# Patient Record
Sex: Female | Born: 1985 | Race: White | Hispanic: No | Marital: Single | Smoking: Former smoker
Health system: Southern US, Community
[De-identification: ages and names within clinical notes are randomized; demographics above are authoritative.]

## PROBLEM LIST (undated history)

## (undated) DIAGNOSIS — F4310 Post-traumatic stress disorder, unspecified: Secondary | ICD-10-CM

## (undated) DIAGNOSIS — R000 Tachycardia, unspecified: Secondary | ICD-10-CM

## (undated) DIAGNOSIS — F32A Depression, unspecified: Secondary | ICD-10-CM

## (undated) DIAGNOSIS — I82409 Acute embolism and thrombosis of unspecified deep veins of unspecified lower extremity: Secondary | ICD-10-CM

## (undated) DIAGNOSIS — J45909 Unspecified asthma, uncomplicated: Secondary | ICD-10-CM

## (undated) DIAGNOSIS — F329 Major depressive disorder, single episode, unspecified: Secondary | ICD-10-CM

## (undated) DIAGNOSIS — I499 Cardiac arrhythmia, unspecified: Secondary | ICD-10-CM

## (undated) DIAGNOSIS — F419 Anxiety disorder, unspecified: Secondary | ICD-10-CM

## (undated) DIAGNOSIS — N200 Calculus of kidney: Secondary | ICD-10-CM

## (undated) HISTORY — DX: Post-traumatic stress disorder, unspecified: F43.10

## (undated) HISTORY — DX: Depression, unspecified: F32.A

## (undated) HISTORY — DX: Tachycardia, unspecified: R00.0

## (undated) HISTORY — DX: Acute embolism and thrombosis of unspecified deep veins of unspecified lower extremity: I82.409

## (undated) HISTORY — DX: Anxiety disorder, unspecified: F41.9

## (undated) HISTORY — DX: Major depressive disorder, single episode, unspecified: F32.9

## (undated) HISTORY — DX: Unspecified asthma, uncomplicated: J45.909

---

## 2005-10-23 DIAGNOSIS — N200 Calculus of kidney: Secondary | ICD-10-CM

## 2005-10-23 HISTORY — DX: Calculus of kidney: N20.0

## 2011-10-24 HISTORY — PX: TUBAL LIGATION: SHX77

## 2013-05-11 ENCOUNTER — Encounter (HOSPITAL_COMMUNITY): Payer: Self-pay | Admitting: *Deleted

## 2013-05-11 ENCOUNTER — Emergency Department (HOSPITAL_COMMUNITY)
Admission: EM | Admit: 2013-05-11 | Discharge: 2013-05-11 | Discharge status: Against Medical Advice | Payer: Self-pay | Attending: Emergency Medicine | Admitting: Emergency Medicine

## 2013-05-11 DIAGNOSIS — F17200 Nicotine dependence, unspecified, uncomplicated: Secondary | ICD-10-CM | POA: Insufficient documentation

## 2013-05-11 DIAGNOSIS — F410 Panic disorder [episodic paroxysmal anxiety] without agoraphobia: Secondary | ICD-10-CM | POA: Insufficient documentation

## 2013-05-11 NOTE — ED Notes (Signed)
Pts name called no answer x2

## 2013-05-11 NOTE — ED Notes (Signed)
Did not respond to call from waiting room x 1

## 2013-05-11 NOTE — ED Notes (Signed)
The pt reports that she had  A panic attack earlier today and it was suggested to come to the ed from the [facility] house.  She has been free of cocaine for one week.  More frequent panic attacks  For one week

## 2013-06-22 ENCOUNTER — Emergency Department (HOSPITAL_COMMUNITY)
Admission: EM | Admit: 2013-06-22 | Discharge: 2013-06-22 | Disposition: A | Discharge status: Home / Self Care | Payer: Self-pay | Attending: Emergency Medicine | Admitting: Emergency Medicine

## 2013-06-22 ENCOUNTER — Encounter (HOSPITAL_COMMUNITY): Payer: Self-pay | Admitting: *Deleted

## 2013-06-22 DIAGNOSIS — Z9851 Tubal ligation status: Secondary | ICD-10-CM | POA: Insufficient documentation

## 2013-06-22 DIAGNOSIS — Z79899 Other long term (current) drug therapy: Secondary | ICD-10-CM | POA: Insufficient documentation

## 2013-06-22 DIAGNOSIS — Z87442 Personal history of urinary calculi: Secondary | ICD-10-CM | POA: Insufficient documentation

## 2013-06-22 DIAGNOSIS — R109 Unspecified abdominal pain: Secondary | ICD-10-CM | POA: Insufficient documentation

## 2013-06-22 DIAGNOSIS — M545 Low back pain, unspecified: Secondary | ICD-10-CM | POA: Insufficient documentation

## 2013-06-22 DIAGNOSIS — Z3202 Encounter for pregnancy test, result negative: Secondary | ICD-10-CM | POA: Insufficient documentation

## 2013-06-22 DIAGNOSIS — M549 Dorsalgia, unspecified: Secondary | ICD-10-CM

## 2013-06-22 DIAGNOSIS — E669 Obesity, unspecified: Secondary | ICD-10-CM | POA: Insufficient documentation

## 2013-06-22 DIAGNOSIS — Z87891 Personal history of nicotine dependence: Secondary | ICD-10-CM | POA: Insufficient documentation

## 2013-06-22 HISTORY — DX: Calculus of kidney: N20.0

## 2013-06-22 LAB — COMPREHENSIVE METABOLIC PANEL
AST: 11 U/L (ref 0–37)
Albumin: 3.2 g/dL — ABNORMAL LOW (ref 3.5–5.2)
BUN: 19 mg/dL (ref 6–23)
Calcium: 9.1 mg/dL (ref 8.4–10.5)
Creatinine, Ser: 0.78 mg/dL (ref 0.50–1.10)
Total Bilirubin: 0.2 mg/dL — ABNORMAL LOW (ref 0.3–1.2)
Total Protein: 6.4 g/dL (ref 6.0–8.3)

## 2013-06-22 LAB — CBC WITH DIFFERENTIAL/PLATELET
Basophils Absolute: 0.1 10*3/uL (ref 0.0–0.1)
Basophils Relative: 1 % (ref 0–1)
Eosinophils Absolute: 0.2 10*3/uL (ref 0.0–0.7)
Eosinophils Relative: 2 % (ref 0–5)
HCT: 37.1 % (ref 36.0–46.0)
Hemoglobin: 12.7 g/dL (ref 12.0–15.0)
MCH: 29.1 pg (ref 26.0–34.0)
MCHC: 34.2 g/dL (ref 30.0–36.0)
Monocytes Absolute: 0.8 10*3/uL (ref 0.1–1.0)
Monocytes Relative: 8 % (ref 3–12)
RDW: 13 % (ref 11.5–15.5)

## 2013-06-22 LAB — URINALYSIS, ROUTINE W REFLEX MICROSCOPIC
Glucose, UA: NEGATIVE mg/dL
Leukocytes, UA: NEGATIVE
Protein, ur: NEGATIVE mg/dL
Specific Gravity, Urine: 1.024 (ref 1.005–1.030)

## 2013-06-22 LAB — POCT PREGNANCY, URINE: Preg Test, Ur: NEGATIVE

## 2013-06-22 MED ORDER — KETOROLAC TROMETHAMINE 60 MG/2ML IM SOLN
30.0000 mg | Freq: Once | INTRAMUSCULAR | Status: AC
  Administered 2013-06-22: 30 mg via INTRAMUSCULAR
  Filled 2013-06-22: qty 2

## 2013-06-22 MED ORDER — ACETAMINOPHEN 325 MG PO TABS: 975.0000 mg | ORAL_TABLET | Freq: Once | ORAL | Status: DC

## 2013-06-22 MED ORDER — ACETAMINOPHEN 325 MG PO TABS: 650.0000 mg | ORAL_TABLET | Freq: Once | ORAL | Status: DC

## 2013-06-22 MED ORDER — ONDANSETRON 4 MG PO TBDP
4.0000 mg | ORAL_TABLET | Freq: Once | ORAL | Status: AC
  Administered 2013-06-22: 4 mg via ORAL
  Filled 2013-06-22: qty 1

## 2013-06-22 MED ORDER — TRAMADOL HCL 50 MG PO TABS: 50.0000 mg | ORAL_TABLET | Freq: Four times a day (QID) | ORAL | Status: DC | PRN

## 2013-06-22 NOTE — ED Notes (Signed)
C/o low back pain for 5 day, abd pain with nausea today

## 2013-06-22 NOTE — ED Provider Notes (Signed)
Medical screening examination/treatment/procedure(s) were performed by non-physician practitioner and as supervising physician I was immediately available for consultation/collaboration.  Flint Melter, MD 06/22/13 [phone]

## 2013-06-22 NOTE — ED Notes (Signed)
Pt wanted xray before discharge- EDP Wentz in to explain reason not .  Pt understood - delay for discharge for EDP to talk with pt

## 2013-06-22 NOTE — ED Provider Notes (Signed)
CSN: [id number]     Arrival date & time 06/22/13  2007 History   First MD Initiated Contact with Patient 06/22/13 2107     Chief Complaint  Patient presents with  . Back Pain  . Abdominal Pain   (Consider location/radiation/quality/duration/timing/severity/associated sxs/prior Treatment) HPI  Tracie Aguilar is a 27 y.o. female complaining of low back pain x5 days. Pain is described as severe, non-radiating. She has been taking motrin and APAP with little relief. Pt also reports a lower abd cramping now resolved stating today lasting 30 minutes described as sharp and severe. Pt denies fever, numbness weakness, change in bowel or bladder habits, h/o IVDU or cancer, abnormal vaginal discharge.   Past Medical History  Diagnosis Date  . Kidney stones    Past Surgical History  Procedure Laterality Date  . Tubal ligation     No family history on file. History  Substance Use Topics  . Smoking status: Former Games developer  . Smokeless tobacco: Not on file  . Alcohol Use: Yes   OB History   Grav Para Term Preterm Abortions TAB SAB Ect Mult Living                 Review of Systems 10 systems reviewed and found to be negative, except as noted in the HPI  Allergies  Sulfa antibiotics  Home Medications   Current Outpatient Rx  Name  Route  Sig  Dispense  Refill  . acetaminophen (TYLENOL) 500 MG tablet   Oral   Take 500 mg by mouth every 6 (six) hours as needed for pain.         Tracie Aguilar FLUoxetine (PROZAC) 20 MG capsule   Oral   Take 20 mg by mouth daily.         Tracie Aguilar ibuprofen (ADVIL,MOTRIN) 200 MG tablet   Oral   Take 200 mg by mouth every 6 (six) hours as needed for pain.         . Melatonin 3 MG TABS   Oral   Take 1 tablet by mouth at bedtime.          BP 114/56  Pulse 76  Temp(Src) 98.9 F (37.2 C) (Oral)  Resp 20  SpO2 99%  LMP 06/08/2013 Physical Exam  Nursing note and vitals reviewed. Constitutional: She is oriented to person, place, and time. She appears  well-developed and well-nourished. No distress.  Obese   HENT:  Head: Normocephalic.  Mouth/Throat: Oropharynx is clear and moist.  Eyes: Conjunctivae and EOM are normal.  Neck: Normal range of motion.  Cardiovascular: Normal rate, regular rhythm and intact distal pulses.   Pulmonary/Chest: Effort normal and breath sounds normal. No stridor. No respiratory distress. She has no wheezes. She has no rales. She exhibits no tenderness.  Abdominal: Soft. Bowel sounds are normal. She exhibits no distension and no mass. There is no rebound and no guarding.  Mild Suprapubic TTP, no peritoneal signs  Musculoskeletal: Normal range of motion.  Strength is 5 out of 5 to bilateral lower extremities at hip and knee, extensor hallucis longus 5 out of 5. Ankle strength 5 out of 5, no clonus, neurovascularly intact.   Neurological: She is alert and oriented to person, place, and time.  Psychiatric: She has a normal mood and affect.    ED Course  Procedures (including critical care time) Labs Review Labs Reviewed  COMPREHENSIVE METABOLIC PANEL - Abnormal; Notable for the following:    Albumin 3.2 (*)    Total Bilirubin 0.2 (*)  All other components within normal limits  CBC WITH DIFFERENTIAL  URINALYSIS, ROUTINE W REFLEX MICROSCOPIC  POCT PREGNANCY, URINE   Imaging Review No results found.  MDM   1. Back pain   2. Abdominal  pain, other specified site    Filed Vitals:   06/22/13 2015  BP: 114/56  Pulse: 76  Temp: 98.9 F (37.2 C)  TempSrc: Oral  Resp: 20  SpO2: 99%     Tracie Aguilar is a 27 y.o. female Patient with back pain.  No neurological deficits and normal neuro exam.  Patient can walk but states is painful.  No loss of bowel or bladder control.  No concern for cauda equina.  No fever, night sweats, weight loss, h/o cancer, IVDU.  RICE protocol and pain medicine indicated and discussed with patient.   Patient is nontoxic, nonseptic appearing, in no apparent distress.   Patient's pain and other symptoms adequately managed in emergency department.  Fluid bolus given.  Labs, imaging and vitals reviewed.  Patient does not meet the SIRS or Sepsis criteria.  On repeat exam patient does not have a surgical abdomin and there are nor peritoneal signs.  No indication of appendicitis, bowel obstruction, bowel perforation, cholecystitis, diverticulitis, PID or ectopic pregnancy.  Patient discharged home with symptomatic treatment and given strict instructions for follow-up with their primary care physician.  I have also discussed reasons to return immediately to the ER.  Patient expresses understanding and agrees with plan.  Medications  ondansetron (ZOFRAN-ODT) disintegrating tablet 4 mg (not administered)  ketorolac (TORADOL) injection 30 mg (not administered)    Pt is hemodynamically stable, appropriate for, and amenable to discharge at this time. Pt verbalized understanding and agrees with care plan. All questions answered. Outpatient follow-up and specific return precautions discussed.    New Prescriptions   TRAMADOL (ULTRAM) 50 MG TABLET    Take 1 tablet (50 mg total) by mouth every 6 (six) hours as needed for pain.    Note: Portions of this report may have been transcribed using voice recognition software. Every effort was made to ensure accuracy; however, inadvertent computerized transcription errors may be present      Tracie Emery, PA-C 06/22/13 2222

## 2013-06-25 ENCOUNTER — Emergency Department (HOSPITAL_COMMUNITY)
Admission: EM | Admit: 2013-06-25 | Discharge: 2013-06-25 | Disposition: A | Discharge status: Home / Self Care | Payer: Self-pay | Attending: Emergency Medicine | Admitting: Emergency Medicine

## 2013-06-25 ENCOUNTER — Encounter (HOSPITAL_COMMUNITY): Payer: Self-pay | Admitting: Emergency Medicine

## 2013-06-25 ENCOUNTER — Emergency Department (HOSPITAL_COMMUNITY): Payer: Self-pay

## 2013-06-25 DIAGNOSIS — M461 Sacroiliitis, not elsewhere classified: Secondary | ICD-10-CM | POA: Insufficient documentation

## 2013-06-25 DIAGNOSIS — Z79899 Other long term (current) drug therapy: Secondary | ICD-10-CM | POA: Insufficient documentation

## 2013-06-25 DIAGNOSIS — Z9851 Tubal ligation status: Secondary | ICD-10-CM | POA: Insufficient documentation

## 2013-06-25 DIAGNOSIS — Z87442 Personal history of urinary calculi: Secondary | ICD-10-CM | POA: Insufficient documentation

## 2013-06-25 DIAGNOSIS — M545 Low back pain, unspecified: Secondary | ICD-10-CM | POA: Insufficient documentation

## 2013-06-25 DIAGNOSIS — R110 Nausea: Secondary | ICD-10-CM | POA: Insufficient documentation

## 2013-06-25 DIAGNOSIS — Z3202 Encounter for pregnancy test, result negative: Secondary | ICD-10-CM | POA: Insufficient documentation

## 2013-06-25 DIAGNOSIS — Z87891 Personal history of nicotine dependence: Secondary | ICD-10-CM | POA: Insufficient documentation

## 2013-06-25 LAB — URINALYSIS, ROUTINE W REFLEX MICROSCOPIC
Bilirubin Urine: NEGATIVE
Glucose, UA: NEGATIVE mg/dL
Hgb urine dipstick: NEGATIVE
Ketones, ur: NEGATIVE mg/dL
Protein, ur: NEGATIVE mg/dL

## 2013-06-25 LAB — COMPREHENSIVE METABOLIC PANEL
AST: 13 U/L (ref 0–37)
Albumin: 3.7 g/dL (ref 3.5–5.2)
Alkaline Phosphatase: 79 U/L (ref 39–117)
Chloride: 100 mEq/L (ref 96–112)
Potassium: 4.5 mEq/L (ref 3.5–5.1)
Total Bilirubin: 0.2 mg/dL — ABNORMAL LOW (ref 0.3–1.2)

## 2013-06-25 LAB — CBC WITH DIFFERENTIAL/PLATELET
Basophils Absolute: 0.1 10*3/uL (ref 0.0–0.1)
Basophils Relative: 1 % (ref 0–1)
Hemoglobin: 13.4 g/dL (ref 12.0–15.0)
Lymphocytes Relative: 22 % (ref 12–46)
MCHC: 34.4 g/dL (ref 30.0–36.0)
Monocytes Relative: 6 % (ref 3–12)
Neutro Abs: 7.1 10*3/uL (ref 1.7–7.7)
Neutrophils Relative %: 70 % (ref 43–77)
RDW: 13.1 % (ref 11.5–15.5)
WBC: 10.1 10*3/uL (ref 4.0–10.5)

## 2013-06-25 MED ORDER — METHOCARBAMOL 750 MG PO TABS: 750.0000 mg | ORAL_TABLET | Freq: Four times a day (QID) | ORAL | Status: DC

## 2013-06-25 MED ORDER — IBUPROFEN 600 MG PO TABS: 600.0000 mg | ORAL_TABLET | Freq: Four times a day (QID) | ORAL | Status: DC | PRN

## 2013-06-25 NOTE — ED Notes (Signed)
Pt reports right lower abdominal pain. Pt reports nausea, however denies emesis or diarrhea. Pt also reports lower back pain that started yesterday. Pt reports her urine smells "musky". Pt is A/O x4 and in NAD.

## 2013-06-25 NOTE — ED Provider Notes (Signed)
CSN: [id number]     Arrival date & time 06/25/13  1752 History   First MD Initiated Contact with Patient 06/25/13 1958     Chief Complaint  Patient presents with  . Abdominal Pain  . Back Pain   (Consider location/radiation/quality/duration/timing/severity/associated sxs/prior Treatment) Patient is a 27 y.o. female presenting with abdominal pain and back pain. The history is provided by the patient.  Abdominal Pain Back Pain Associated symptoms: abdominal pain    patient complaining of lower lumbar sacral pain x4 days. Pain is characterized as sharp and worse with sitting or certain movements. Pain radiates to her bilateral thighs. No change in her bowel or bladder function. Pain also radiates to her abdomen has been associated with nausea. Denies any dysuria or hematuria. Denies any saddle anesthesia. Was seen here and given meds for abdominal pain 3 days ago. States that those have not helped. Denies any diarrhea. No black or bloody stools. Denies any vaginal bleeding or discharge. Patient is concerned that this might be an indication with her sciatic nerve however she denies any recent history of back trauma  Past Medical History  Diagnosis Date  . Kidney stones    Past Surgical History  Procedure Laterality Date  . Tubal ligation     No family history on file. History  Substance Use Topics  . Smoking status: Former Games developer  . Smokeless tobacco: Not on file  . Alcohol Use: Yes   OB History   Grav Para Term Preterm Abortions TAB SAB Ect Mult Living                 Review of Systems  Gastrointestinal: Positive for abdominal pain.  Musculoskeletal: Positive for back pain.  All other systems reviewed and are negative.    Allergies  Sulfa antibiotics  Home Medications   Current Outpatient Rx  Name  Route  Sig  Dispense  Refill  . acetaminophen (TYLENOL) 500 MG tablet   Oral   Take 1,000 mg by mouth every 6 (six) hours as needed for pain.          [name] FLUoxetine  (PROZAC) 20 MG capsule   Oral   Take 20 mg by mouth daily.         [name] ibuprofen (ADVIL,MOTRIN) 200 MG tablet   Oral   Take 200 mg by mouth every 6 (six) hours as needed for pain.         . Melatonin 3 MG TABS   Oral   Take 1 tablet by mouth at bedtime.         . traMADol (ULTRAM) 50 MG tablet   Oral   Take 1 tablet (50 mg total) by mouth every 6 (six) hours as needed for pain.   15 tablet   0    BP 113/69  Pulse 76  Temp(Src) 98.4 F (36.9 C) (Oral)  Resp 20  SpO2 99%  LMP 06/08/2013 Physical Exam  Nursing note and vitals reviewed. Constitutional: She is oriented to person, place, and time. She appears well-developed and well-nourished.  Non-toxic appearance. No distress.  HENT:  Head: Normocephalic and atraumatic.  Eyes: Conjunctivae, EOM and lids are normal. Pupils are equal, round, and reactive to light.  Neck: Normal range of motion. Neck supple. No tracheal deviation present. No mass present.  Cardiovascular: Normal rate, regular rhythm and normal heart sounds.  Exam reveals no gallop.   No murmur heard. Pulmonary/Chest: Effort normal and breath sounds normal. No stridor. No respiratory distress. She has  no decreased breath sounds. She has no wheezes. She has no rhonchi. She has no rales.  Abdominal: Soft. Normal appearance and bowel sounds are normal. She exhibits no distension. There is no tenderness. There is no rebound and no CVA tenderness.  Musculoskeletal: Normal range of motion. She exhibits no edema and no tenderness.       Back:  Neurological: She is alert and oriented to person, place, and time. She has normal strength. No cranial nerve deficit or sensory deficit. GCS eye subscore is 4. GCS verbal subscore is 5. GCS motor subscore is 6.  Skin: Skin is warm and dry. No abrasion and no rash noted.  Psychiatric: She has a normal mood and affect. Her speech is normal and behavior is normal.    ED Course  Procedures (including critical care time) Labs  Review Labs Reviewed  COMPREHENSIVE METABOLIC PANEL - Abnormal; Notable for the following:    Glucose, Bld 107 (*)    Total Bilirubin 0.2 (*)    All other components within normal limits  URINALYSIS, ROUTINE W REFLEX MICROSCOPIC - Abnormal; Notable for the following:    APPearance CLOUDY (*)    All other components within normal limits  CBC WITH DIFFERENTIAL  POCT PREGNANCY, URINE   Imaging Review No results found.  MDM  No diagnosis found. Patient's x-rays reviewed and are consistent with sacroiliitis. She'll be treated with anti-inflammatories and muscle accidents and discharged    Toy Baker, MD 06/25/13 2240

## 2013-06-25 NOTE — Progress Notes (Signed)
Patient confirms her pcp is Dr. Edman Circle in [place].  Patient reports she lives here now in [place] and would like to find a doctor.  Patient also does not have insurance.  EDCM provided a list of pcps who accept self pay patients, information regarding Affordable Care Act and Medicaid for insurance,  list of discount pharmacies and website needymeds.org for medication assistance and list of financial assistance in the community such as [employer] and local churches.  Patient thankful for resources.

## 2013-08-02 ENCOUNTER — Emergency Department (HOSPITAL_COMMUNITY)
Admission: EM | Admit: 2013-08-02 | Discharge: 2013-08-02 | Disposition: A | Discharge status: Home / Self Care | Payer: Self-pay

## 2013-08-02 NOTE — ED Notes (Addendum)
Attempted x3  Calling the pt. Out in the waiting area. Pt. Was not there. Nurse was notifed.

## 2015-05-24 ENCOUNTER — Encounter (HOSPITAL_COMMUNITY): Payer: Self-pay

## 2015-05-24 ENCOUNTER — Emergency Department (HOSPITAL_COMMUNITY)
Admission: EM | Admit: 2015-05-24 | Discharge: 2015-05-24 | Disposition: A | Discharge status: Home / Self Care | Payer: Self-pay | Attending: Emergency Medicine | Admitting: Emergency Medicine

## 2015-05-24 DIAGNOSIS — Z87891 Personal history of nicotine dependence: Secondary | ICD-10-CM | POA: Insufficient documentation

## 2015-05-24 DIAGNOSIS — Z79899 Other long term (current) drug therapy: Secondary | ICD-10-CM | POA: Insufficient documentation

## 2015-05-24 DIAGNOSIS — N898 Other specified noninflammatory disorders of vagina: Secondary | ICD-10-CM | POA: Insufficient documentation

## 2015-05-24 DIAGNOSIS — Z87442 Personal history of urinary calculi: Secondary | ICD-10-CM | POA: Insufficient documentation

## 2015-05-24 LAB — URINALYSIS, ROUTINE W REFLEX MICROSCOPIC
BILIRUBIN URINE: NEGATIVE
Glucose, UA: NEGATIVE mg/dL
HGB URINE DIPSTICK: NEGATIVE
KETONES UR: NEGATIVE mg/dL
LEUKOCYTES UA: NEGATIVE
Nitrite: NEGATIVE
PH: 8.5 — AB (ref 5.0–8.0)
PROTEIN: NEGATIVE mg/dL
Specific Gravity, Urine: 1.02 (ref 1.005–1.030)
Urobilinogen, UA: 0.2 mg/dL (ref 0.0–1.0)

## 2015-05-24 LAB — WET PREP, GENITAL
TRICH WET PREP: NONE SEEN
YEAST WET PREP: NONE SEEN

## 2015-05-24 NOTE — ED Provider Notes (Signed)
CSN: [id number]     Arrival date & time 05/24/15  1208 History  This chart was scribed for non-physician practitioner, Ivery Quale, PA-C working with Rolland Porter, MD by Placido Sou, ED scribe. This patient was seen in room APFT24/APFT24 and the patient's care was started at 1:08 PM.   Chief Complaint  Patient presents with  . Vaginal Discharge   Patient is a 29 y.o. female presenting with vaginal discharge. The history is provided by the patient. No language interpreter was used.  Vaginal Discharge Quality:  Yellow Severity:  Mild Onset quality:  Sudden Duration:  3 days Timing:  Unable to specify Progression:  Unchanged Chronicity:  New   HPI Comments: Tracie Aguilar is a 29 y.o. female who presents to the Emergency Department complaining of a foreign object inside of her vagina with onset 3 days ago. Pt notes that she believes she could have a tampon lodged inside her vagina and further requested an STD test due to a past partner being diagnosed with an STD recently. She notes an associated yellow vaginal discharge with onset 3 days ago. She notes that she has not had sexual relations for the past 2 months. Pt denies any fever, chills or trouble urinating.   Past Medical History  Diagnosis Date  . Kidney stones    Past Surgical History  Procedure Laterality Date  . Tubal ligation     No family history on file. History  Substance Use Topics  . Smoking status: Former Games developer  . Smokeless tobacco: Not on file  . Alcohol Use: Yes   OB History    No data available     Review of Systems  Genitourinary: Positive for vaginal discharge. Negative for urgency and decreased urine volume.  All other systems reviewed and are negative.  Allergies  Sulfa antibiotics  Home Medications   Prior to Admission medications   Medication Sig Start Date End Date Taking? Authorizing Provider  acetaminophen (TYLENOL) 500 MG tablet Take 1,000 mg by mouth every 6 (six) hours as needed for  pain.     Historical Provider, MD  FLUoxetine (PROZAC) 20 MG capsule Take 20 mg by mouth daily.    Historical Provider, MD  ibuprofen (ADVIL,MOTRIN) 200 MG tablet Take 200 mg by mouth every 6 (six) hours as needed for pain.    Historical Provider, MD  ibuprofen (ADVIL,MOTRIN) 600 MG tablet Take 1 tablet (600 mg total) by mouth every 6 (six) hours as needed for pain. 06/25/13   Lorre Nick, MD  Melatonin 3 MG TABS Take 1 tablet by mouth at bedtime.    Historical Provider, MD  methocarbamol (ROBAXIN-750) 750 MG tablet Take 1 tablet (750 mg total) by mouth 4 (four) times daily. 06/25/13   Lorre Nick, MD  traMADol (ULTRAM) 50 MG tablet Take 1 tablet (50 mg total) by mouth every 6 (six) hours as needed for pain. 06/22/13   Nicole Pisciotta, PA-C   BP 129/83 mmHg  Pulse 80  Temp(Src) 98.3 F (36.8 C) (Oral)  Resp 16  Ht 5\' 3"  (1.6 m)  Wt 220 lb (99.791 kg)  BMI 38.98 kg/m2  SpO2 99%  LMP 05/16/2015 Physical Exam  Constitutional: She is oriented to person, place, and time. She appears well-developed and well-nourished. No distress.  HENT:  Head: Normocephalic and atraumatic.  Mouth/Throat: Oropharynx is clear and moist.  Eyes: Conjunctivae and EOM are normal. Pupils are equal, round, and reactive to light.  Neck: Normal range of motion. Neck supple. No tracheal deviation  present.  Cardiovascular: Normal rate.   Pulmonary/Chest: Effort normal and breath sounds normal. No respiratory distress. She has no wheezes. She has no rales. She exhibits no tenderness.  Abdominal: Soft. Bowel sounds are normal.  No CVA tenderness  Genitourinary:  chaperone present during examination; no rash or abnormality of the external vaginal area; mild to moderate increased redness of vaginal vault; no foreign body in vaginal vault; no adnexal tenderness present; the os of the cervix is closed; no palpable mass  Musculoskeletal: Normal range of motion.  Neurological: She is alert and oriented to person, place, and  time.  Skin: Skin is warm and dry.  Psychiatric: She has a normal mood and affect. Her behavior is normal.  Nursing note and vitals reviewed.  ED Course  Procedures  DIAGNOSTIC STUDIES: Oxygen Saturation is 99% on RA, normal by my interpretation.    COORDINATION OF CARE: 1:13 PM Discussed treatment plan with pt at bedside GC, chlamydia, HIV and syphulus testing. Pt agreed to plan.  Labs Review Labs Reviewed - No data to display  Imaging Review No results found.   EKG Interpretation None      MDM  Wet prep reveals only a few clue cells present. Urinalysis is nonacute. Testing for RPR, HIV, and gonorrhea Chlamydia sent to the lab. The patient was reassured of the examination findings, as well as the current lab findings. The patient will contact the flow manager's office for the results of the remaining tests. Questions were answered. Patient is in agreement with this discharge plan.    Final diagnoses:  None    **I have reviewed nursing notes, vital signs, and all appropriate lab and imaging results for this patient.*  **I personally performed the services described in this documentation, which was scribed in my presence. The recorded information has been reviewed and is accurate.Ivery Quale, PA-C 05/27/15 2000  Rolland Porter, MD 06/11/15 [phone]

## 2015-05-24 NOTE — Discharge Instructions (Signed)
No foreign body (tampon) found on examination at this time. Someone from the flow manager's office will call if any abnormalities of your test. You may call [phone] and asked for the flow manager's office for details in 2-3 days if you have not received a call.

## 2015-05-24 NOTE — ED Notes (Signed)
Room set up for pelvic

## 2015-05-24 NOTE — ED Notes (Signed)
Pt states she thinks she may have left a tampon inside her vagina. Was also told by her partner that she should be checked for STDs. Some vaginal discharge

## 2015-05-24 NOTE — ED Notes (Signed)
Patient given discharge instruction, verbalized understand. Patient ambulatory out of the department.  

## 2015-05-25 LAB — GC/CHLAMYDIA PROBE AMP (~~LOC~~) NOT AT ARMC
Chlamydia: NEGATIVE
Neisseria Gonorrhea: NEGATIVE

## 2015-05-25 LAB — HIV ANTIBODY (ROUTINE TESTING W REFLEX): HIV SCREEN 4TH GENERATION: NONREACTIVE

## 2015-05-25 LAB — RPR: RPR: NONREACTIVE

## 2015-07-20 ENCOUNTER — Ambulatory Visit: Payer: Self-pay | Admitting: Family Medicine

## 2015-07-21 ENCOUNTER — Ambulatory Visit (INDEPENDENT_AMBULATORY_CARE_PROVIDER_SITE_OTHER): Payer: 59 | Admitting: Family Medicine

## 2015-07-21 ENCOUNTER — Other Ambulatory Visit: Payer: Self-pay | Admitting: Family Medicine

## 2015-07-21 ENCOUNTER — Encounter: Payer: Self-pay | Admitting: Family Medicine

## 2015-07-21 VITALS — BP 126/79 | HR 88 | Temp 98.3°F | Resp 18 | Ht 63.0 in | Wt 209.0 lb

## 2015-07-21 DIAGNOSIS — R42 Dizziness and giddiness: Secondary | ICD-10-CM

## 2015-07-21 DIAGNOSIS — R1030 Lower abdominal pain, unspecified: Secondary | ICD-10-CM

## 2015-07-21 DIAGNOSIS — R109 Unspecified abdominal pain: Secondary | ICD-10-CM | POA: Insufficient documentation

## 2015-07-21 DIAGNOSIS — Z23 Encounter for immunization: Secondary | ICD-10-CM | POA: Diagnosis not present

## 2015-07-21 DIAGNOSIS — F32A Depression, unspecified: Secondary | ICD-10-CM

## 2015-07-21 DIAGNOSIS — F329 Major depressive disorder, single episode, unspecified: Secondary | ICD-10-CM

## 2015-07-21 DIAGNOSIS — Z0000 Encounter for general adult medical examination without abnormal findings: Secondary | ICD-10-CM

## 2015-07-21 DIAGNOSIS — Z720 Tobacco use: Secondary | ICD-10-CM

## 2015-07-21 DIAGNOSIS — N898 Other specified noninflammatory disorders of vagina: Secondary | ICD-10-CM | POA: Diagnosis not present

## 2015-07-21 DIAGNOSIS — Z716 Tobacco abuse counseling: Secondary | ICD-10-CM

## 2015-07-21 LAB — WET PREP, GENITAL
Trich, Wet Prep: NONE SEEN — AB
Yeast Wet Prep HPF POC: NONE SEEN — AB

## 2015-07-21 LAB — POCT URINALYSIS DIPSTICK
BILIRUBIN UA: NEGATIVE
GLUCOSE UA: NEGATIVE
KETONES UA: NEGATIVE
Nitrite, UA: NEGATIVE
PH UA: 5.5
Protein, UA: NEGATIVE
RBC UA: NEGATIVE
SPEC GRAV UA: 1.015
Urobilinogen, UA: 0.2

## 2015-07-21 LAB — HEMOGLOBIN A1C: Hgb A1c MFr Bld: 5.4 % (ref 4.6–6.5)

## 2015-07-21 MED ORDER — METRONIDAZOLE 500 MG PO TABS: 500.0000 mg | ORAL_TABLET | Freq: Two times a day (BID) | ORAL | Status: DC
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
## 2015-07-21 NOTE — Patient Instructions (Addendum)
Health Maintenance Adopting a healthy lifestyle and getting preventive care can go a long way to promote health and wellness. Talk with your health care provider about what schedule of regular examinations is right for you. This is a good chance for you to check in with your provider about disease prevention and staying healthy. In between checkups, there are plenty of things you can do on your own. Experts have done a lot of research about which lifestyle changes and preventive measures are most likely to keep you healthy. Ask your health care provider for more information. WEIGHT AND DIET  Eat a healthy diet  Be sure to include plenty of vegetables, fruits, low-fat dairy products, and lean protein.  Do not eat a lot of foods high in solid fats, added sugars, or salt.  Get regular exercise. This is one of the most important things you can do for your health.  Most adults should exercise for at least 150 minutes each week. The exercise should increase your heart rate and make you sweat (moderate-intensity exercise).  Most adults should also do strengthening exercises at least twice a week. This is in addition to the moderate-intensity exercise.  Maintain a healthy weight  Body mass index (BMI) is a measurement that can be used to identify possible weight problems. It estimates body fat based on height and weight. Your health care provider can help determine your BMI and help you achieve or maintain a healthy weight.  For females 25 years of age and older:   A BMI below 18.5 is considered underweight.  A BMI of 18.5 to 24.9 is normal.  A BMI of 25 to 29.9 is considered overweight.  A BMI of 30 and above is considered obese.  Watch levels of cholesterol and blood lipids  You should start having your blood tested for lipids and cholesterol at 29 years of age, then have this test every 5 years.  You may need to have your cholesterol levels checked more often if:  Your lipid or  cholesterol levels are high.  You are older than 29 years of age.  You are at high risk for heart disease.  CANCER SCREENING   Lung Cancer  Lung cancer screening is recommended for adults 97-92 years old who are at high risk for lung cancer because of a history of smoking.  A yearly low-dose CT scan of the lungs is recommended for people who:  Currently smoke.  Have quit within the past 15 years.  Have at least a 30-pack-year history of smoking. A pack year is smoking an average of one pack of cigarettes a day for 1 year.  Yearly screening should continue until it has been 15 years since you quit.  Yearly screening should stop if you develop a health problem that would prevent you from having lung cancer treatment.  Breast Cancer  Practice breast self-awareness. This means understanding how your breasts normally appear and feel.  It also means doing regular breast self-exams. Let your health care provider know about any changes, no matter how small.  If you are in your 20s or 30s, you should have a clinical breast exam (CBE) by a health care provider every 1-3 years as part of a regular health exam.  If you are 76 or older, have a CBE every year. Also consider having a breast X-ray (mammogram) every year.  If you have a family history of breast cancer, talk to your health care provider about genetic screening.  If you are  at high risk for breast cancer, talk to your health care provider about having an MRI and a mammogram every year.  Breast cancer gene (BRCA) assessment is recommended for women who have family members with BRCA-related cancers. BRCA-related cancers include:  Breast.  Ovarian.  Tubal.  Peritoneal cancers.  Results of the assessment will determine the need for genetic counseling and BRCA1 and BRCA2 testing. Cervical Cancer Routine pelvic examinations to screen for cervical cancer are no longer recommended for nonpregnant women who are considered low  risk for cancer of the pelvic organs (ovaries, uterus, and vagina) and who do not have symptoms. A pelvic examination may be necessary if you have symptoms including those associated with pelvic infections. Ask your health care provider if a screening pelvic exam is right for you.   The Pap test is the screening test for cervical cancer for women who are considered at risk.  If you had a hysterectomy for a problem that was not cancer or a condition that could lead to cancer, then you no longer need Pap tests.  If you are older than 65 years, and you have had normal Pap tests for the past 10 years, you no longer need to have Pap tests.  If you have had past treatment for cervical cancer or a condition that could lead to cancer, you need Pap tests and screening for cancer for at least 20 years after your treatment.  If you no longer get a Pap test, assess your risk factors if they change (such as having a new sexual partner). This can affect whether you should start being screened again.  Some women have medical problems that increase their chance of getting cervical cancer. If this is the case for you, your health care provider may recommend more frequent screening and Pap tests.  The human papillomavirus (HPV) test is another test that may be used for cervical cancer screening. The HPV test looks for the virus that can cause cell changes in the cervix. The cells collected during the Pap test can be tested for HPV.  The HPV test can be used to screen women 30 years of age and older. Getting tested for HPV can extend the interval between normal Pap tests from three to five years.  An HPV test also should be used to screen women of any age who have unclear Pap test results.  After 30 years of age, women should have HPV testing as often as Pap tests.  Colorectal Cancer  This type of cancer can be detected and often prevented.  Routine colorectal cancer screening usually begins at 29 years of  age and continues through 29 years of age.  Your health care provider may recommend screening at an earlier age if you have risk factors for colon cancer.  Your health care provider may also recommend using home test kits to check for hidden blood in the stool.  A small camera at the end of a tube can be used to examine your colon directly (sigmoidoscopy or colonoscopy). This is done to check for the earliest forms of colorectal cancer.  Routine screening usually begins at age 50.  Direct examination of the colon should be repeated every 5-10 years through 29 years of age. However, you may need to be screened more often if early forms of precancerous polyps or small growths are found. Skin Cancer  Check your skin from head to toe regularly.  Tell your health care provider about any new moles or changes in   moles, especially if there is a change in a mole's shape or color.  Also tell your health care provider if you have a mole that is larger than the size of a pencil eraser.  Always use sunscreen. Apply sunscreen liberally and repeatedly throughout the day.  Protect yourself by wearing long sleeves, pants, a wide-brimmed hat, and sunglasses whenever you are outside. HEART DISEASE, DIABETES, AND HIGH BLOOD PRESSURE   Have your blood pressure checked at least every 1-2 years. High blood pressure causes heart disease and increases the risk of stroke.  If you are between 75 years and 42 years old, ask your health care provider if you should take aspirin to prevent strokes.  Have regular diabetes screenings. This involves taking a blood sample to check your fasting blood sugar level.  If you are at a normal weight and have a low risk for diabetes, have this test once every three years after 29 years of age.  If you are overweight and have a high risk for diabetes, consider being tested at a younger age or more often. PREVENTING INFECTION  Hepatitis B  If you have a higher risk for  hepatitis B, you should be screened for this virus. You are considered at high risk for hepatitis B if:  You were born in a country where hepatitis B is common. Ask your health care provider which countries are considered high risk.  Your parents were born in a high-risk country, and you have not been immunized against hepatitis B (hepatitis B vaccine).  You have HIV or AIDS.  You use needles to inject street drugs.  You live with someone who has hepatitis B.  You have had sex with someone who has hepatitis B.  You get hemodialysis treatment.  You take certain medicines for conditions, including cancer, organ transplantation, and autoimmune conditions. Hepatitis C  Blood testing is recommended for:  Everyone born from [birth year] through 1965.  Anyone with known risk factors for hepatitis C. Sexually transmitted infections (STIs)  You should be screened for sexually transmitted infections (STIs) including gonorrhea and chlamydia if:  You are sexually active and are younger than 29 years of age.  You are older than 29 years of age and your health care provider tells you that you are at risk for this type of infection.  Your sexual activity has changed since you were last screened and you are at an increased risk for chlamydia or gonorrhea. Ask your health care provider if you are at risk.  If you do not have HIV, but are at risk, it may be recommended that you take a prescription medicine daily to prevent HIV infection. This is called pre-exposure prophylaxis (PrEP). You are considered at risk if:  You are sexually active and do not regularly use condoms or know the HIV status of your partner(s).  You take drugs by injection.  You are sexually active with a partner who has HIV. Talk with your health care provider about whether you are at high risk of being infected with HIV. If you choose to begin PrEP, you should first be tested for HIV. You should then be tested every 3 months for  as long as you are taking PrEP.  PREGNANCY   If you are premenopausal and you may become pregnant, ask your health care provider about preconception counseling.  If you may become pregnant, take 400 to 800 micrograms (mcg) of folic acid every day.  If you want to prevent pregnancy, talk to your  health care provider about birth control (contraception). OSTEOPOROSIS AND MENOPAUSE   Osteoporosis is a disease in which the bones lose minerals and strength with aging. This can result in serious bone fractures. Your risk for osteoporosis can be identified using a bone density scan.  If you are 78 years of age or older, or if you are at risk for osteoporosis and fractures, ask your health care provider if you should be screened.  Ask your health care provider whether you should take a calcium or vitamin D supplement to lower your risk for osteoporosis.  Menopause may have certain physical symptoms and risks.  Hormone replacement therapy may reduce some of these symptoms and risks. Talk to your health care provider about whether hormone replacement therapy is right for you.  HOME CARE INSTRUCTIONS   Schedule regular health, dental, and eye exams.  Stay current with your immunizations.   Do not use any tobacco products including cigarettes, chewing tobacco, or electronic cigarettes.  If you are pregnant, do not drink alcohol.  If you are breastfeeding, limit how much and how often you drink alcohol.  Limit alcohol intake to no more than 1 drink per day for nonpregnant women. One drink equals 12 ounces of beer, 5 ounces of wine, or 1 ounces of hard liquor.  Do not use street drugs.  Do not share needles.  Ask your health care provider for help if you need support or information about quitting drugs.  Tell your health care provider if you often feel depressed.  Tell your health care provider if you have ever been abused or do not feel safe at home. Document Released: 04/24/2011  Document Revised: 02/23/2014 Document Reviewed: 09/10/2013 [facility] Patient Information 2015 [place], [place]. This information is not intended to replace advice given to you by your health care provider. Make sure you discuss any questions you have with your health care provider.   Safe Sex Safe sex is about reducing the risk of giving or getting a sexually transmitted disease (STD). STDs are spread through sexual contact involving the genitals, mouth, or rectum. Some STDs can be cured and others cannot. Safe sex can also prevent unintended pregnancies.  WHAT ARE SOME SAFE SEX PRACTICES?  Limit your sexual activity to only one partner who is having sex with only you.  Talk to your partner about his or her past partners, past STDs, and drug use.  Use a condom every time you have sexual intercourse. This includes vaginal, oral, and anal sexual activity. Both females and males should wear condoms during oral sex. Only use latex or polyurethane condoms and water-based lubricants. Using petroleum-based lubricants or oils to lubricate a condom will weaken the condom and increase the chance that it will break. The condom should be in place from the beginning to the end of sexual activity. Wearing a condom reduces, but does not completely eliminate, your risk of getting or giving an STD. STDs can be spread by contact with infected body fluids and skin.  Get vaccinated for hepatitis B and HPV.  Avoid alcohol and recreational drugs, which can affect your judgment. You may forget to use a condom or participate in high-risk sex.  For females, avoid douching after sexual intercourse. Douching can spread an infection farther into the reproductive tract.  Check your body for signs of sores, blisters, rashes, or unusual discharge. See your health care provider if you notice any of these signs.  Avoid sexual contact if you have symptoms of an infection or are  being treated for an STD. If you or your partner has  herpes, avoid sexual contact when blisters are present. Use condoms at all other times.  If you are at risk of being infected with HIV, it is recommended that you take a prescription medicine daily to prevent HIV infection. This is called pre-exposure prophylaxis (PrEP). You are considered at risk if:  You are a man who has sex with other men (MSM).  You are a heterosexual man or woman who is sexually active with more than one partner.  You take drugs by injection.  You are sexually active with a partner who has HIV.  Talk with your health care provider about whether you are at high risk of being infected with HIV. If you choose to begin PrEP, you should first be tested for HIV. You should then be tested every 3 months for as long as you are taking PrEP.  See your health care provider for regular screenings, exams, and tests for other STDs. Before having sex with a new partner, each of you should be screened for STDs and should talk about the results with each other. WHAT ARE THE BENEFITS OF SAFE SEX?   There is less chance of getting or giving an STD.  You can prevent unwanted or unintended pregnancies.  By discussing safe sex concerns with your partner, you may increase feelings of intimacy, comfort, trust, and honesty between the two of you. Document Released: 11/16/2004 Document Revised: 02/23/2014 Document Reviewed: 04/01/2012 [facility] Patient Information 2015 [place], [place]. This information is not intended to replace advice given to you by your health care provider. Make sure you discuss any questions you have with your health care provider.  Smoking Cessation Quitting smoking is important to your health and has many advantages. However, it is not always easy to quit since nicotine is a very addictive drug. Oftentimes, people try 3 times or more before being able to quit. This document explains the best ways for you to prepare to quit smoking. Quitting takes hard work and a lot of  effort, but you can do it. ADVANTAGES OF QUITTING SMOKING  You will live longer, feel better, and live better.  Your body will feel the impact of quitting smoking almost immediately.  Within 20 minutes, blood pressure decreases. Your pulse returns to its normal level.  After 8 hours, carbon monoxide levels in the blood return to normal. Your oxygen level increases.  After 24 hours, the chance of having a heart attack starts to decrease. Your breath, hair, and body stop smelling like smoke.  After 48 hours, damaged nerve endings begin to recover. Your sense of taste and smell improve.  After 72 hours, the body is virtually free of nicotine. Your bronchial tubes relax and breathing becomes easier.  After 2 to 12 weeks, lungs can hold more air. Exercise becomes easier and circulation improves.  The risk of having a heart attack, stroke, cancer, or lung disease is greatly reduced.  After 1 year, the risk of coronary heart disease is cut in half.  After 5 years, the risk of stroke falls to the same as a nonsmoker.  After 10 years, the risk of lung cancer is cut in half and the risk of other cancers decreases significantly.  After 15 years, the risk of coronary heart disease drops, usually to the level of a nonsmoker.  If you are pregnant, quitting smoking will improve your chances of having a healthy baby.  The people you live with, especially  any children, will be healthier.  You will have extra money to spend on things other than cigarettes. QUESTIONS TO THINK ABOUT BEFORE ATTEMPTING TO QUIT You may want to talk about your answers with your health care provider.  Why do you want to quit?  If you tried to quit in the past, what helped and what did not?  What will be the most difficult situations for you after you quit? How will you plan to handle them?  Who can help you through the tough times? Your family? Friends? A health care provider?  What pleasures do you get from  smoking? What ways can you still get pleasure if you quit? Here are some questions to ask your health care provider:  How can you help me to be successful at quitting?  What medicine do you think would be best for me and how should I take it?  What should I do if I need more help?  What is smoking withdrawal like? How can I get information on withdrawal? GET READY  Set a quit date.  Change your environment by getting rid of all cigarettes, ashtrays, matches, and lighters in your home, car, or work. Do not let people smoke in your home.  Review your past attempts to quit. Think about what worked and what did not. GET SUPPORT AND ENCOURAGEMENT You have a better chance of being successful if you have help. You can get support in many ways.  Tell your family, friends, and coworkers that you are going to quit and need their support. Ask them not to smoke around you.  Get individual, group, or telephone counseling and support. Programs are available at [employer] and health centers. Call your local health department for information about programs in your area.  Spiritual beliefs and practices may help some smokers quit.  Download a "quit meter" on your computer to keep track of quit statistics, such as how long you have gone without smoking, cigarettes not smoked, and money saved.  Get a self-help book about quitting smoking and staying off tobacco. [place] yourself from urges to smoke. Talk to someone, go for a walk, or occupy your time with a task.  Change your normal routine. Take a different route to work. Drink tea instead of coffee. Eat breakfast in a different place.  Reduce your stress. Take a hot bath, exercise, or read a book.  Plan something enjoyable to do every day. Reward yourself for not smoking.  Explore interactive web-based programs that specialize in helping you quit. GET MEDICINE AND USE IT CORRECTLY Medicines can help you  stop smoking and decrease the urge to smoke. Combining medicine with the above behavioral methods and support can greatly increase your chances of successfully quitting smoking.  Nicotine replacement therapy helps deliver nicotine to your body without the negative effects and risks of smoking. Nicotine replacement therapy includes nicotine gum, lozenges, inhalers, nasal sprays, and skin patches. Some may be available over-the-counter and others require a prescription.  Antidepressant medicine helps people abstain from smoking, but how this works is unknown. This medicine is available by prescription.  Nicotinic receptor partial agonist medicine simulates the effect of nicotine in your brain. This medicine is available by prescription. Ask your health care provider for advice about which medicines to use and how to use them based on your health history. Your health care provider will tell you what side effects to look out for if you choose to be on  a medicine or therapy. Carefully read the information on the package. Do not use any other product containing nicotine while using a nicotine replacement product.  RELAPSE OR DIFFICULT SITUATIONS Most relapses occur within the first 3 months after quitting. Do not be discouraged if you start smoking again. Remember, most people try several times before finally quitting. You may have symptoms of withdrawal because your body is used to nicotine. You may crave cigarettes, be irritable, feel very hungry, cough often, get headaches, or have difficulty concentrating. The withdrawal symptoms are only temporary. They are strongest when you first quit, but they will go away within 10-14 days. To reduce the chances of relapse, try to:  Avoid drinking alcohol. Drinking lowers your chances of successfully quitting.  Reduce the amount of caffeine you consume. Once you quit smoking, the amount of caffeine in your body increases and can give you symptoms, such as a rapid  heartbeat, sweating, and anxiety.  Avoid smokers because they can make you want to smoke.  Do not let weight gain distract you. Many smokers will gain weight when they quit, usually less than 10 pounds. Eat a healthy diet and stay active. You can always lose the weight gained after you quit.  Find ways to improve your mood other than smoking. FOR MORE INFORMATION  www.smokefree.gov  Document Released: 10/03/2001 Document Revised: 02/23/2014 Document Reviewed: 01/18/2012 [facility] Patient Information 2015 [place], [place]. This information is not intended to replace advice given to you by your health care provider. Make sure you discuss any questions you have with your health care provider.   Please think about quitting smoking.  This is very important for your health.  There are many things we can do to help you quit.  Let  us know if you are interested and have a quit date.  You can also call 1-800-QUIT-NOW 332 377 0838) for free smoking cessation counseling.  Schedule your complete physical within 2 months, at that time we will complete a physical, PAP smear and any further labs we need.  You will be called with your results once they are available.

## 2015-07-21 NOTE — Progress Notes (Signed)
Pre visit review using our clinic review tool, if applicable. No additional management support is needed unless otherwise documented below in the visit note. 

## 2015-07-22 ENCOUNTER — Telehealth: Payer: Self-pay | Admitting: Family Medicine

## 2015-07-22 LAB — GC/CHLAMYDIA PROBE AMP
CT PROBE, AMP APTIMA: NEGATIVE
CT PROBE, AMP APTIMA: NEGATIVE
GC PROBE AMP APTIMA: NEGATIVE
GC PROBE AMP APTIMA: NEGATIVE

## 2015-07-22 NOTE — Telephone Encounter (Signed)
Please call patient:  - Her A1c yesterday was 5.4, this is not in the diabetic range. This is normal. - Her gonorrhea and Chlamydia cultures were negative. Her wet prep showed signs of bacterial vaginosis only, and this is what we are treating her for.

## 2015-07-22 NOTE — Telephone Encounter (Signed)
Patient aware of results.  Pt had no questions at this time.  

## 2015-07-23 LAB — URINE CULTURE
Colony Count: NO GROWTH
ORGANISM ID, BACTERIA: NO GROWTH

## 2015-07-26 ENCOUNTER — Encounter: Payer: Self-pay | Admitting: Family Medicine

## 2015-09-23 ENCOUNTER — Encounter: Payer: 59 | Admitting: Family Medicine

## 2015-09-24 ENCOUNTER — Encounter: Payer: 59 | Admitting: Family Medicine

## 2015-09-24 DIAGNOSIS — Z0289 Encounter for other administrative examinations: Secondary | ICD-10-CM

## 2015-12-21 ENCOUNTER — Ambulatory Visit (INDEPENDENT_AMBULATORY_CARE_PROVIDER_SITE_OTHER): Payer: Managed Care, Other (non HMO) | Admitting: Family Medicine

## 2015-12-21 ENCOUNTER — Encounter: Payer: Self-pay | Admitting: Family Medicine

## 2015-12-21 VITALS — BP 109/77 | HR 94 | Temp 98.8°F | Resp 20 | Wt 200.5 lb

## 2015-12-21 DIAGNOSIS — J029 Acute pharyngitis, unspecified: Secondary | ICD-10-CM

## 2015-12-21 DIAGNOSIS — J020 Streptococcal pharyngitis: Secondary | ICD-10-CM

## 2015-12-21 LAB — POCT RAPID STREP A (OFFICE): RAPID STREP A SCREEN: POSITIVE — AB

## 2015-12-21 MED ORDER — METHYLPREDNISOLONE ACETATE 80 MG/ML IJ SUSP
80.0000 mg | Freq: Once | INTRAMUSCULAR | Status: AC
  Administered 2015-12-21: 40 mg via INTRAMUSCULAR

## 2015-12-21 MED ORDER — AMOXICILLIN-POT CLAVULANATE 875-125 MG PO TABS: 1.0000 | ORAL_TABLET | Freq: Two times a day (BID) | ORAL | Status: DC

## 2015-12-21 NOTE — Progress Notes (Signed)
Patient ID: Tracie Aguilar, female   DOB: 02-Dec-1985, 30 y.o.   MRN: [id number]    Tracie Aguilar , June 27, 1986, 30 y.o., female MRN: [id number]  CC: Sore throat Subjective: Pt presents for an acute OV with complaints of Sore throat of 1 day duration.Associated symptoms include fever/chills, fullness of ears, difficulty swallowing 2/2 to pain, mild cough. Pt feels symptoms are worse with eating and drinking.  Pt has tried Hall's cough drops only, to ease their symptoms.  No nausea, vomit diarrhea, nasal congestion, rhinorrhea or rash.  Allergies  Allergen Reactions  . Sulfa Antibiotics Hives   Social History  Substance Use Topics  . Smoking status: Current Every Day Smoker -- 1.00 packs/day for 15 years    Types: Cigarettes    Start date: 10/23/2001  . Smokeless tobacco: Never Used  . Alcohol Use: 0.0 oz/week    0 Standard drinks or equivalent per week     Comment: occassinal   Past Medical History  Diagnosis Date  . Kidney stones 2007    passed x2  . Depression   . Post traumatic stress disorder     prior medications   . Anxiety    Past Surgical History  Procedure Laterality Date  . Tubal ligation  2013   Family History  Problem Relation Age of Onset  . COPD Mother   . Alcohol abuse Mother   . Arthritis Mother     OA  . Hypertension Mother   . Alcohol abuse Father   . Hypertension Father   . Diabetes Father   . Lung cancer Maternal Grandmother   . Stroke Maternal Grandfather   . Hypertension Maternal Grandfather   . Diabetes Maternal Grandfather   . Stroke Paternal Grandfather   . Breast cancer Cousin   . Diabetes Cousin      Medication List       This list is accurate as of: 12/21/15 10:36 AM.  Always use your most recent med list.               acetaminophen 500 MG tablet  Commonly known as:  TYLENOL  Take 1,000 mg by mouth every 6 (six) hours as needed for pain.     ibuprofen 800 MG tablet  Commonly known as:  ADVIL,MOTRIN  Take 800 mg by  mouth 2 (two) times daily as needed for moderate pain.     metroNIDAZOLE 500 MG tablet  Commonly known as:  FLAGYL  Take 1 tablet (500 mg total) by mouth 2 (two) times daily.        ROS: Negative, with the exception of above mentioned in HPI  Objective:  There were no vitals taken for this visit. There is no weight on file to calculate BMI. Gen: Afebrile. No acute distress. Nontoxic in appearance, well developed, well nourished caucasian female. Pleasant.  HENT: AT. [address]. Bilateral TM visualized and normal in appearance. MMM, no oral lesions. Bilateral nares with erythema. Throat with erythema and exudates. Cough on exam.  Eyes:Pupils Equal Round Reactive to light, Extraocular movements intact,  Conjunctiva without redness, discharge or icterus. Neck/lymp/endocrine: Supple,bilateral large anterior cervical  lymphadenopathy CV: RRR  Chest: CTAB, no wheeze or crackles. Good air movement, normal resp effort.  Abd: Soft. NTND. BS present Skin: No rashes, purpura or petechiae.  Neuro: Normal gait. PERLA. EOMi. Alert. Oriented x3   Assessment/Plan: Tracie Aguilar is a 30 y.o. female present for acute OV for 1. Sore throat - POCT rapid strep A--> Positive  2. Strep pharyngitis - amoxicillin-clavulanate (AUGMENTIN) 875-125 MG tablet; Take 1 tablet by mouth 2 (two) times daily.  Dispense: 20 tablet; Refill: 0 - Strep positive today - Augmentin,  - depo IM 40 - rest, hydrate,  - work excuse for today - Pt encouraged to schedule CPE  electronically signed by:  Felix Pacini, DO  [facility] - [place]

## 2015-12-21 NOTE — Addendum Note (Signed)
Addended by: Thomasena Edis on: 12/21/2015 11:11 AM   Modules accepted: Orders

## 2015-12-21 NOTE — Patient Instructions (Signed)

## 2016-02-13 ENCOUNTER — Encounter (HOSPITAL_COMMUNITY): Payer: Self-pay | Admitting: Emergency Medicine

## 2016-02-13 ENCOUNTER — Emergency Department (HOSPITAL_COMMUNITY)
Admission: EM | Admit: 2016-02-13 | Discharge: 2016-02-13 | Disposition: A | Discharge status: Home / Self Care | Payer: Managed Care, Other (non HMO) | Attending: Emergency Medicine | Admitting: Emergency Medicine

## 2016-02-13 DIAGNOSIS — F329 Major depressive disorder, single episode, unspecified: Secondary | ICD-10-CM | POA: Diagnosis not present

## 2016-02-13 DIAGNOSIS — M545 Low back pain, unspecified: Secondary | ICD-10-CM

## 2016-02-13 DIAGNOSIS — F17210 Nicotine dependence, cigarettes, uncomplicated: Secondary | ICD-10-CM | POA: Insufficient documentation

## 2016-02-13 MED ORDER — NAPROXEN 500 MG PO TABS: 500.0000 mg | ORAL_TABLET | Freq: Two times a day (BID) | ORAL | Status: DC

## 2016-02-13 MED ORDER — CYCLOBENZAPRINE HCL 10 MG PO TABS: 10.0000 mg | ORAL_TABLET | Freq: Three times a day (TID) | ORAL | Status: DC | PRN

## 2016-02-13 NOTE — ED Notes (Addendum)
Pt with lower back pain that started yesterday, denies any injury, took advil and Nabumetone without relief

## 2016-02-13 NOTE — ED Notes (Signed)
Pt reports lower back pain that increases with movement since yesterday. Pt ambulatory. Denies urinary symptoms or bowel incontinence.

## 2016-02-13 NOTE — ED Provider Notes (Signed)
History  By signing my name below, I, Karle PlumberJennifer Tensley, attest that this documentation has been prepared under the direction and in the presence of Neya Creegan, PA-C. Electronically Signed: Karle PlumberJennifer Tensley, ED Scribe. 02/13/2016. 11:28 AM.  Chief Complaint  Patient presents with  . Back Pain   The history is provided by the patient and medical records. No language interpreter was used.    HPI Comments:  Tracie Aguilar is a 30 y.o. female who presents to the Emergency Department complaining of low back pain that began yesterday morning. She states the pain radiates down the BLE. She has taken Advil and Nabumetone with no significant relief of the pain. Any movement increases the pain. She denies alleviating factors. She denies dysuria, hematuria, bowel or bladder incontinence, fever, chills, vaginal discharge, vaginal bleeding, numbness, tingling or weakness of the lower extremities. She denies any trauma, injury or fall. She denies possibility of pregnancy. [facility].  Past Medical History  Diagnosis Date  . Kidney stones 2007    passed x2  . Depression   . Post traumatic stress disorder     prior medications   . Anxiety    Past Surgical History  Procedure Laterality Date  . Tubal ligation  2013   Family History  Problem Relation Age of Onset  . COPD Mother   . Alcohol abuse Mother   . Arthritis Mother     OA  . Hypertension Mother   . Alcohol abuse Father   . Hypertension Father   . Diabetes Father   . Lung cancer Maternal Grandmother   . Stroke Maternal Grandfather   . Hypertension Maternal Grandfather   . Diabetes Maternal Grandfather   . Stroke Paternal Grandfather   . Breast cancer Cousin   . Diabetes Cousin    Social History  Substance Use Topics  . Smoking status: Current Every Day Smoker -- 1.00 packs/day for 15 years    Types: Cigarettes    Start date: 10/23/2001  . Smokeless tobacco: Never Used  . Alcohol Use: 0.0  oz/week    0 Standard drinks or equivalent per week     Comment: occassinal   OB History    Gravida Para Term Preterm AB TAB SAB Ectopic Multiple Living   5 3             Review of Systems  Constitutional: Negative for fever and chills.  Genitourinary: Negative for dysuria, hematuria, vaginal bleeding and vaginal discharge.       No bowel or bladder incontinence  Musculoskeletal: Positive for back pain.  Neurological: Negative for weakness and numbness.    Allergies  Sulfa antibiotics  Home Medications   Prior to Admission medications   Medication Sig Start Date End Date Taking? Authorizing Provider  acetaminophen (TYLENOL) 500 MG tablet Take 1,000 mg by mouth every 6 (six) hours as needed for pain.     Historical Provider, MD  amoxicillin-clavulanate (AUGMENTIN) 875-125 MG tablet Take 1 tablet by mouth 2 (two) times daily. 12/21/15   Renee A Kuneff, DO  ibuprofen (ADVIL,MOTRIN) 800 MG tablet Take 800 mg by mouth 2 (two) times daily as needed for moderate pain.    Historical Provider, MD   Triage Vitals: BP 121/79 mmHg  Pulse 89  Temp(Src) 98.4 F (36.9 C) (Oral)  Resp 18  Ht 5\' 3"  (1.6 m)  Wt 190 lb (86.183 kg)  BMI 33.67 kg/m2  SpO2 100%  LMP 02/13/2016 Physical Exam  Constitutional: She  is oriented to person, place, and time. She appears well-developed and well-nourished.  HENT:  Head: Normocephalic and atraumatic.  Eyes: EOM are normal.  Neck: Normal range of motion.  Cardiovascular: Normal rate, regular rhythm and normal heart sounds.   Pulmonary/Chest: Effort normal and breath sounds normal. No respiratory distress. She has no wheezes. She has no rales.  Abdominal: Soft.  No CVA tenderness bilaterally  Musculoskeletal: Normal range of motion.  No midline lumbar spine tenderness. No TTP over bilateral SI joint. Pain with bilateral straight leg raise.   Neurological: She is alert and oriented to person, place, and time.  Skin: Skin is warm and dry.  Psychiatric:  She has a normal mood and affect. Her behavior is normal.  Nursing note and vitals reviewed.   ED Course  Procedures (including critical care time) DIAGNOSTIC STUDIES: Oxygen Saturation is 100% on RA, normal by my interpretation.   COORDINATION OF CARE: 11:25 AM- Advised pt that imaging is not indicated at this time. Will prescribe NSAID and muscle relaxer. Encouraged pt to apply heat therapy, rest and follow up with PCP. Pt verbalizes understanding and agrees to plan.  Medications - No data to display   MDM   Final diagnoses:  Midline low back pain without sciatica    Patient with lower back pain, onset yesterday. No injuries. No pain radiating down her extremities. No urinary retention or bowel incontinence. Denies vaginal complaints. States not currently sexually active. Denies any fever or chills. No numbness or weakness in extremities. Pain is worsened with movement and ambulating. Exam with no red flags to suggest cauda equina or infectious process. Discussed findings with patient. Explained we'll try to control her pain and give her a muscle relaxant and she'll need to follow with her primary care doctor for further imaging. Patient became frustrated stating that she came here to figure out what's going on requesting imaging. Explained to her that the x-ray that we have available would not most likely show what may be going on, since it only shows bones and she has not had any injuries. Also explained that her exam does not indicate any emergent process and no imaging indicated in ED but she must follow up if symptoms continue. Discussed with her return precuations. Will start on naprosyn and flexeril. Return precautions discussed.   Filed Vitals:   02/13/16 1056  BP: 121/79  Pulse: 89  Temp: 98.4 F (36.9 C)  TempSrc: Oral  Resp: 18  Height:  (1.6 m)  Weight: 86.183 kg  SpO2: 100%    I personally performed the services described in this documentation, which was  scribed in my presence. The recorded information has been reviewed and is accurate.   Jaynie Crumble, PA-C 02/13/16 1139  Cathren Laine, MD 02/16/16 2356

## 2016-02-13 NOTE — Discharge Instructions (Signed)
Naprosyn for pain. Flexeril for muscle spasms as needed. Start stretches and exercises at home. See below. Follow up with your doctor for further imaging and testing if pain not improving.    Back Pain, Adult Back pain is very common in adults.The cause of back pain is rarely dangerous and the pain often gets better over time.The cause of your back pain may not be known. Some common causes of back pain include: 1. Strain of the muscles or ligaments supporting the spine. 2. Wear and tear (degeneration) of the spinal disks. 3. Arthritis. 4. Direct injury to the back. For many people, back pain may return. Since back pain is rarely dangerous, most people can learn to manage this condition on their own. HOME CARE INSTRUCTIONS Watch your back pain for any changes. The following actions may help to lessen any discomfort you are feeling: 1. Remain active. It is stressful on your back to sit or stand in one place for long periods of time. Do not sit, drive, or stand in one place for more than 30 minutes at a time. Take short walks on even surfaces as soon as you are able.Try to increase the length of time you walk each day. 2. Exercise regularly as directed by your health care provider. Exercise helps your back heal faster. It also helps avoid future injury by keeping your muscles strong and flexible. 3. Do not stay in bed.Resting more than 1-2 days can delay your recovery. 4. Pay attention to your body when you bend and lift. The most comfortable positions are those that put less stress on your recovering back. Always use proper lifting techniques, including: 1. Bending your knees. 2. Keeping the load close to your body. 3. Avoiding twisting. 5. Find a comfortable position to sleep. Use a firm mattress and lie on your side with your knees slightly bent. If you lie on your back, put a pillow under your knees. 6. Avoid feeling anxious or stressed.Stress increases muscle tension and can worsen back  pain.It is important to recognize when you are anxious or stressed and learn ways to manage it, such as with exercise. 7. Take medicines only as directed by your health care provider. Over-the-counter medicines to reduce pain and inflammation are often the most helpful.Your health care provider may prescribe muscle relaxant drugs.These medicines help dull your pain so you can more quickly return to your normal activities and healthy exercise. 8. Apply ice to the injured area: 1. Put ice in a plastic bag. 2. Place a towel between your skin and the bag. 3. Leave the ice on for 20 minutes, 2-3 times a day for the first 2-3 days. After that, ice and heat may be alternated to reduce pain and spasms. 9. Maintain a healthy weight. Excess weight puts extra stress on your back and makes it difficult to maintain good posture. SEEK MEDICAL CARE IF: 1. You have pain that is not relieved with rest or medicine. 2. You have increasing pain going down into the legs or buttocks. 3. You have pain that does not improve in one week. 4. You have night pain. 5. You lose weight. 6. You have a fever or chills. SEEK IMMEDIATE MEDICAL CARE IF:  1. You develop new bowel or bladder control problems. 2. You have unusual weakness or numbness in your arms or legs. 3. You develop nausea or vomiting. 4. You develop abdominal pain. 5. You feel faint.   This information is not intended to replace advice given to you by  your health care provider. Make sure you discuss any questions you have with your health care provider.   Document Released: 10/09/2005 Document Revised: 10/30/2014 Document Reviewed: 02/10/2014 Elsevier Interactive Patient Education 2016 [employer]   Back Exercises The following exercises strengthen the muscles that help to support the back. They also help to keep the lower back flexible. Doing these exercises can help to prevent back pain or lessen existing pain. If you have back pain or  discomfort, try doing these exercises 2-3 times each day or as told by your health care provider. When the pain goes away, do them once each day, but increase the number of times that you repeat the steps for each exercise (do more repetitions). If you do not have back pain or discomfort, do these exercises once each day or as told by your health care provider. EXERCISES Single Knee to Chest Repeat these steps 3-5 times for each leg: 5. Lie on your back on a firm bed or the floor with your legs extended. 6. Bring one knee to your chest. Your other leg should stay extended and in contact with the floor. 7. Hold your knee in place by grabbing your knee or thigh. 8. Pull on your knee until you feel a gentle stretch in your lower back. 9. Hold the stretch for 10-30 seconds. 10. Slowly release and straighten your leg. Pelvic Tilt Repeat these steps 5-10 times: 10. Lie on your back on a firm bed or the floor with your legs extended. 11. Bend your knees so they are pointing toward the ceiling and your feet are flat on the floor. 12. Tighten your lower abdominal muscles to press your lower back against the floor. This motion will tilt your pelvis so your tailbone points up toward the ceiling instead of pointing to your feet or the floor. 13. With gentle tension and even breathing, hold this position for 5-10 seconds. Cat-Cow Repeat these steps until your lower back becomes more flexible: 7. Get into a hands-and-knees position on a firm surface. Keep your hands under your shoulders, and keep your knees under your hips. You may place padding under your knees for comfort. 8. Let your head hang down, and point your tailbone toward the floor so your lower back becomes rounded like the back of a cat. 9. Hold this position for 5 seconds. 10. Slowly lift your head and point your tailbone up toward the ceiling so your back forms a sagging arch like the back of a cow. 11. Hold this position for 5  seconds. Press-Ups Repeat these steps 5-10 times: 6. Lie on your abdomen (face-down) on the floor. 7. Place your palms near your head, about shoulder-width apart. 8. While you keep your back as relaxed as possible and keep your hips on the floor, slowly straighten your arms to raise the top half of your body and lift your shoulders. Do not use your back muscles to raise your upper torso. You may adjust the placement of your hands to make yourself more comfortable. 9. Hold this position for 5 seconds while you keep your back relaxed. 10. Slowly return to lying flat on the floor. Bridges Repeat these steps 10 times: 1. Lie on your back on a firm surface. 2. Bend your knees so they are pointing toward the ceiling and your feet are flat on the floor. 3. Tighten your buttocks muscles and lift your buttocks off of the floor until your waist is at almost the same height as your knees.  You should feel the muscles working in your buttocks and the back of your thighs. If you do not feel these muscles, slide your feet 1-2 inches farther away from your buttocks. 4. Hold this position for 3-5 seconds. 5. Slowly lower your hips to the starting position, and allow your buttocks muscles to relax completely. If this exercise is too easy, try doing it with your arms crossed over your chest. Abdominal Crunches Repeat these steps 5-10 times: 1. Lie on your back on a firm bed or the floor with your legs extended. 2. Bend your knees so they are pointing toward the ceiling and your feet are flat on the floor. 3. Cross your arms over your chest. 4. Tip your chin slightly toward your chest without bending your neck. 5. Tighten your abdominal muscles and slowly raise your trunk (torso) high enough to lift your shoulder blades a tiny bit off of the floor. Avoid raising your torso higher than that, because it can put too much stress on your low back and it does not help to strengthen your abdominal muscles. 6. Slowly  return to your starting position. Back Lifts Repeat these steps 5-10 times: 1. Lie on your abdomen (face-down) with your arms at your sides, and rest your forehead on the floor. 2. Tighten the muscles in your legs and your buttocks. 3. Slowly lift your chest off of the floor while you keep your hips pressed to the floor. Keep the back of your head in line with the curve in your back. Your eyes should be looking at the floor. 4. Hold this position for 3-5 seconds. 5. Slowly return to your starting position. SEEK MEDICAL CARE IF:  Your back pain or discomfort gets much worse when you do an exercise.  Your back pain or discomfort does not lessen within 2 hours after you exercise. If you have any of these problems, stop doing these exercises right away. Do not do them again unless your health care provider says that you can. SEEK IMMEDIATE MEDICAL CARE IF:  You develop sudden, severe back pain. If this happens, stop doing the exercises right away. Do not do them again unless your health care provider says that you can.   This information is not intended to replace advice given to you by your health care provider. Make sure you discuss any questions you have with your health care provider.   Document Released: 11/16/2004 Document Revised: 06/30/2015 Document Reviewed: 12/03/2014 Elsevier Interactive Patient Education [employer]

## 2016-02-13 NOTE — ED Notes (Signed)
Reviewed d/c instructions, pt upset due to not getting MRI, instructed to f/u with her PCP.  Pt refused to sign for d/c instructions and walked out with steady gait

## 2016-02-14 ENCOUNTER — Emergency Department (HOSPITAL_COMMUNITY): Payer: Managed Care, Other (non HMO)

## 2016-02-14 ENCOUNTER — Encounter (HOSPITAL_COMMUNITY): Payer: Self-pay

## 2016-02-14 ENCOUNTER — Emergency Department (HOSPITAL_COMMUNITY)
Admission: EM | Admit: 2016-02-14 | Discharge: 2016-02-14 | Disposition: A | Discharge status: Home / Self Care | Payer: Managed Care, Other (non HMO) | Attending: Emergency Medicine | Admitting: Emergency Medicine

## 2016-02-14 DIAGNOSIS — W109XXA Fall (on) (from) unspecified stairs and steps, initial encounter: Secondary | ICD-10-CM | POA: Insufficient documentation

## 2016-02-14 DIAGNOSIS — Z79899 Other long term (current) drug therapy: Secondary | ICD-10-CM | POA: Insufficient documentation

## 2016-02-14 DIAGNOSIS — M545 Low back pain, unspecified: Secondary | ICD-10-CM

## 2016-02-14 DIAGNOSIS — R0602 Shortness of breath: Secondary | ICD-10-CM | POA: Diagnosis not present

## 2016-02-14 DIAGNOSIS — Y999 Unspecified external cause status: Secondary | ICD-10-CM | POA: Insufficient documentation

## 2016-02-14 DIAGNOSIS — F329 Major depressive disorder, single episode, unspecified: Secondary | ICD-10-CM | POA: Diagnosis not present

## 2016-02-14 DIAGNOSIS — R6883 Chills (without fever): Secondary | ICD-10-CM | POA: Insufficient documentation

## 2016-02-14 DIAGNOSIS — Y929 Unspecified place or not applicable: Secondary | ICD-10-CM | POA: Insufficient documentation

## 2016-02-14 DIAGNOSIS — F17210 Nicotine dependence, cigarettes, uncomplicated: Secondary | ICD-10-CM | POA: Insufficient documentation

## 2016-02-14 DIAGNOSIS — S39012A Strain of muscle, fascia and tendon of lower back, initial encounter: Secondary | ICD-10-CM | POA: Diagnosis not present

## 2016-02-14 DIAGNOSIS — Z791 Long term (current) use of non-steroidal anti-inflammatories (NSAID): Secondary | ICD-10-CM | POA: Insufficient documentation

## 2016-02-14 DIAGNOSIS — R110 Nausea: Secondary | ICD-10-CM | POA: Insufficient documentation

## 2016-02-14 DIAGNOSIS — Y939 Activity, unspecified: Secondary | ICD-10-CM | POA: Diagnosis not present

## 2016-02-14 DIAGNOSIS — S3992XA Unspecified injury of lower back, initial encounter: Secondary | ICD-10-CM | POA: Diagnosis present

## 2016-02-14 LAB — CBC WITH DIFFERENTIAL/PLATELET
BASOS ABS: 0 10*3/uL (ref 0.0–0.1)
Basophils Relative: 0 %
EOS ABS: 0.1 10*3/uL (ref 0.0–0.7)
EOS PCT: 2 %
HCT: 43.6 % (ref 36.0–46.0)
Hemoglobin: 14.9 g/dL (ref 12.0–15.0)
LYMPHS PCT: 26 %
Lymphs Abs: 2.3 10*3/uL (ref 0.7–4.0)
MCH: 29.2 pg (ref 26.0–34.0)
MCHC: 34.2 g/dL (ref 30.0–36.0)
MCV: 85.5 fL (ref 78.0–100.0)
MONO ABS: 0.5 10*3/uL (ref 0.1–1.0)
Monocytes Relative: 6 %
Neutro Abs: 5.7 10*3/uL (ref 1.7–7.7)
Neutrophils Relative %: 66 %
PLATELETS: 298 10*3/uL (ref 150–400)
RBC: 5.1 MIL/uL (ref 3.87–5.11)
RDW: 13.7 % (ref 11.5–15.5)
WBC: 8.7 10*3/uL (ref 4.0–10.5)

## 2016-02-14 LAB — I-STAT BETA HCG BLOOD, ED (MC, WL, AP ONLY)

## 2016-02-14 LAB — BASIC METABOLIC PANEL
ANION GAP: 6 (ref 5–15)
BUN: 13 mg/dL (ref 6–20)
CO2: 27 mmol/L (ref 22–32)
CREATININE: 0.71 mg/dL (ref 0.44–1.00)
Calcium: 9.1 mg/dL (ref 8.9–10.3)
Chloride: 105 mmol/L (ref 101–111)
GFR calc Af Amer: >60 mL/min (ref >60)
Glucose, Bld: 83 mg/dL (ref 65–99)
Potassium: 4.4 mmol/L (ref 3.5–5.1)
Sodium: 138 mmol/L (ref 135–145)

## 2016-02-14 NOTE — Discharge Instructions (Signed)
Rest off your feet as much as possible. Continue current medications. Work note provided. Make an appointment to follow-up with your regular doctor. If symptoms don't improve MRI would be appropriate.

## 2016-02-14 NOTE — ED Provider Notes (Signed)
CSN: [id number]     Arrival date & time 02/14/16  [id number] History  By signing my name below, I, Tracie Aguilar, attest that this documentation has been prepared under the direction and in the presence of Vanetta MuldersScott  Wenzlick, MD. Electronically Signed: Marica OtterNusrat Aguilar, ED Scribe. 02/14/2016. 11:44 AM.  Chief Complaint  Patient presents with  . Back Pain    HPI PCP: Felix Pacinienee Kuneff, DO HPI Comments: Leonides SakeGloria D Aguilar is a 30 y.o. female, with PMHx noted below, who presents to the Emergency Department complaining of traumatic, acute, intermittent, 9/10 mid back pain radiating to bilateral hips onset 3 days ago after pt fell down two steps. Aggravating factors include movement and walking. Associated Sx include numbness in pelvic area, pt states "I can't even push to pee."  Pt denies any LOC or head trauma resulting from the fall. Pt denies abd pain. Pt reports she presented to the ED for the same yesterday whereby she was was Rx antiinflammatories and muscle relaxers. Pt reports no improvement since her visit to the ED yesterday. Pt also endorses chills, SOB, nausea, shoulder pain. Pt denies fever, congestion, rhinorrhea, sore throat, cough, SOB, visual disturbances, abd pain, n/v/d, dysuria, hematuria (pt notes she is currently on her menstrual period), back pain, swelling of legs, headache, lightheadedness, or any new rashes. Pt further denies Hx of bleeding easily/blood thinner use. At onset of exam, however, pt denied any injury to her back; pt also reported no injuries when evaluated at the ED yesterday for the same.    Past Medical History  Diagnosis Date  . Kidney stones 2007    passed x2  . Depression   . Post traumatic stress disorder     prior medications   . Anxiety    Past Surgical History  Procedure Laterality Date  . Tubal ligation  2013   Family History  Problem Relation Age of Onset  . COPD Mother   . Alcohol abuse Mother   . Arthritis Mother     OA  . Hypertension Mother   . Alcohol  abuse Father   . Hypertension Father   . Diabetes Father   . Lung cancer Maternal Grandmother   . Stroke Maternal Grandfather   . Hypertension Maternal Grandfather   . Diabetes Maternal Grandfather   . Stroke Paternal Grandfather   . Breast cancer Cousin   . Diabetes Cousin    Social History  Substance Use Topics  . Smoking status: Current Every Day Smoker -- 1.00 packs/day for 15 years    Types: Cigarettes    Start date: 10/23/2001  . Smokeless tobacco: Never Used  . Alcohol Use: 0.0 oz/week    0 Standard drinks or equivalent per week     Comment: occassinal   OB History    Gravida Para Term Preterm AB TAB SAB Ectopic Multiple Living   5 3             Review of Systems  Constitutional: Positive for chills. Negative for fever.  HENT: Negative for congestion, rhinorrhea and sore throat.   Eyes: Negative for visual disturbance.  Respiratory: Positive for shortness of breath.   Cardiovascular: Negative for chest pain.  Gastrointestinal: Positive for nausea. Negative for vomiting, abdominal pain and diarrhea.  Genitourinary: Negative for dysuria.  Musculoskeletal: Positive for back pain and arthralgias (shoulder pain).  Skin: Negative for rash.  Neurological: Positive for numbness. Negative for headaches.  Hematological: Does not bruise/bleed easily.  Psychiatric/Behavioral: Negative for confusion.   Allergies  Sulfa antibiotics  Home Medications   Prior to Admission medications   Medication Sig Start Date End Date Taking? Authorizing Provider  cyclobenzaprine (FLEXERIL) 10 MG tablet Take 1 tablet (10 mg total) by mouth 3 (three) times daily as needed for muscle spasms. 02/13/16  Yes Tatyana Kirichenko, PA-C  naproxen (NAPROSYN) 500 MG tablet Take 1 tablet (500 mg total) by mouth 2 (two) times daily. 02/13/16  Yes Tatyana Kirichenko, PA-C  acetaminophen (TYLENOL) 500 MG tablet Take 1,000 mg by mouth every 6 (six) hours as needed for pain.     Historical Provider, MD   amoxicillin-clavulanate (AUGMENTIN) 875-125 MG tablet Take 1 tablet by mouth 2 (two) times daily. Patient not taking: Reported on 02/14/2016 12/21/15   Renee A Kuneff, DO  ibuprofen (ADVIL,MOTRIN) 800 MG tablet Take 800 mg by mouth 2 (two) times daily as needed for moderate pain.    Historical Provider, MD   Triage Vitals: BP 110/67 mmHg  Pulse 94  Temp(Src) 98 F (36.7 C) (Oral)  Resp 20  Ht  (1.6 m)  Wt 190 lb (86.183 kg)  BMI 33.67 kg/m2  SpO2 98%  LMP 02/13/2016 Physical Exam  Constitutional: She is oriented to person, place, and time. She appears well-developed and well-nourished. No distress.  HENT:  Head: Normocephalic and atraumatic.  Mouth/Throat: Mucous membranes are normal.  Eyes: EOM are normal. Pupils are equal, round, and reactive to light. No scleral icterus.  Eyes track normal  Neck: Normal range of motion.  Cardiovascular: Normal rate, regular rhythm and normal heart sounds.   Pulmonary/Chest: Effort normal and breath sounds normal.  Abdominal: Soft. Bowel sounds are normal. She exhibits no distension. There is no tenderness.  Musculoskeletal: Normal range of motion. She exhibits no edema (no swelling on bilateral ankles).  Dorsalis pedis pulse 2+ bilaterally    Neurological: She is alert and oriented to person, place, and time.  Sensation and strength to bilateral feet   Skin: Skin is warm and dry.  Psychiatric: She has a normal mood and affect. Judgment normal.  Nursing note and vitals reviewed.   ED Course  Procedures (including critical care time) DIAGNOSTIC STUDIES: Oxygen Saturation is 98% on ra, nl by my interpretation.    COORDINATION OF CARE: 11:37 AM: Discussed treatment plan which includes imaging and labs with pt at bedside; patient verbalizes understanding and agrees with treatment plan.  Labs Review Labs Reviewed  CBC WITH DIFFERENTIAL/PLATELET  BASIC METABOLIC PANEL  I-STAT BETA HCG BLOOD, ED ([facility], [facility], [facility] ONLY)    Imaging  Review Dg Lumbar Spine Complete  02/14/2016  CLINICAL DATA:  Persistent low back pain after slipping and falling at home 3 days ago. Initial encounter. EXAM: LUMBAR SPINE - COMPLETE 4+ VIEW COMPARISON:  06/25/2013 radiographs. FINDINGS: Five lumbar type vertebral bodies. The alignment is normal. The disc spaces are preserved. No evidence of acute fracture or pars defect. There are is stable sclerosis adjacent to the sacroiliac joints. There are probable bilateral renal calculi. Bilateral tubal ligation clips are noted. IMPRESSION: Stable lumbar spine radiographs demonstrating no acute findings. Probable bilateral renal calculi. Electronically Signed   By: Carey Bullocks M.D.   On: 02/14/2016 12:59   I have personally reviewed and evaluated these images and lab results as part of my medical decision-making.   EKG Interpretation None      MDM  Discussed with pt risks of radiation exposure to pelvic area; stressed to patient if imaging is not necessary it is better to avoid radiation exposure. At end of  discussion, pt had opportunity to ask questions and has no further questions at this time.  Final diagnoses:  Midline low back pain without sciatica  Lumbar strain, initial encounter     Patient initially denied any fall or injury and that included just today's visit. Then she changed her story and said she fell off some steps on Friday. X-rays of the lumbar back without any acute bony injuries. Patient without any neuro focal deficits no evidence of any sciatica. Patient treated with anti-inflammatory and muscle relaxers yesterday. Recommend bed rest as much as possible work note provided. Follow-up with her primary care doctor. And returning for any new or worse symptoms.   I personally performed the services described in this documentation, which was scribed in my presence. The recorded information has been reviewed and is accurate.      Vanetta Mulders, MD 02/14/16 [phone]

## 2016-02-14 NOTE — ED Notes (Signed)
Patient ambulated to restroom with no complaints of pain or discomfort.

## 2016-02-14 NOTE — ED Notes (Signed)
Pt reports pain in mid back that radiates around to hips since Saturday.  Repors was here yesterday for same and was given antiinflammatory and muscle relaxers.  Pt says is no better.

## 2016-05-12 ENCOUNTER — Emergency Department (HOSPITAL_COMMUNITY)
Admission: EM | Admit: 2016-05-12 | Discharge: 2016-05-12 | Disposition: A | Discharge status: Home / Self Care | Payer: Managed Care, Other (non HMO) | Attending: Emergency Medicine | Admitting: Emergency Medicine

## 2016-05-12 ENCOUNTER — Encounter (HOSPITAL_COMMUNITY): Payer: Self-pay | Admitting: *Deleted

## 2016-05-12 DIAGNOSIS — N760 Acute vaginitis: Secondary | ICD-10-CM | POA: Insufficient documentation

## 2016-05-12 DIAGNOSIS — F329 Major depressive disorder, single episode, unspecified: Secondary | ICD-10-CM | POA: Insufficient documentation

## 2016-05-12 DIAGNOSIS — B9689 Other specified bacterial agents as the cause of diseases classified elsewhere: Secondary | ICD-10-CM

## 2016-05-12 DIAGNOSIS — F17210 Nicotine dependence, cigarettes, uncomplicated: Secondary | ICD-10-CM | POA: Insufficient documentation

## 2016-05-12 DIAGNOSIS — Z711 Person with feared health complaint in whom no diagnosis is made: Secondary | ICD-10-CM

## 2016-05-12 LAB — WET PREP, GENITAL
Sperm: NONE SEEN
TRICH WET PREP: NONE SEEN
Yeast Wet Prep HPF POC: NONE SEEN

## 2016-05-12 MED ORDER — AZITHROMYCIN 250 MG PO TABS
1000.0000 mg | ORAL_TABLET | Freq: Once | ORAL | Status: AC
  Administered 2016-05-12: 1000 mg via ORAL
  Filled 2016-05-12: qty 4

## 2016-05-12 MED ORDER — CEFTRIAXONE SODIUM 250 MG IJ SOLR
250.0000 mg | Freq: Once | INTRAMUSCULAR | Status: AC
  Administered 2016-05-12: 250 mg via INTRAMUSCULAR
  Filled 2016-05-12: qty 250

## 2016-05-12 MED ORDER — METRONIDAZOLE 500 MG PO TABS: 500.0000 mg | ORAL_TABLET | Freq: Two times a day (BID) | ORAL | Status: DC

## 2016-05-12 MED ORDER — FLUCONAZOLE 100 MG PO TABS
200.0000 mg | ORAL_TABLET | Freq: Once | ORAL | Status: AC
  Administered 2016-05-12: 200 mg via ORAL
  Filled 2016-05-12: qty 2

## 2016-05-12 NOTE — ED Provider Notes (Signed)
CSN: [id number]     Arrival date & time 05/12/16  0911 History   First MD Initiated Contact with Patient 05/12/16 [phone]     Chief Complaint  Patient presents with  . Vaginal Itching     (Consider location/radiation/quality/duration/timing/severity/associated sxs/prior Treatment) HPI Tracie Aguilar is a 30 y.o.  who presents to the ED with vaginal itching that started a week ago and has continued. She reports using OTC medication for yeast without relief. She has had a BTL for birth control and has unprotected sex. She has been with her current sex partner x 7 years and has found out that he is cheating on her. She request STI testing and treatment for possible GC and Chlamydia. 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     Past Medical History  Diagnosis Date  . Kidney stones 2007    passed x2  . Depression   . Post traumatic stress disorder     prior medications   . Anxiety    Past Surgical History  Procedure Laterality Date  . Tubal ligation  2013   Family History  Problem Relation Age of Onset  . COPD Mother   . Alcohol abuse Mother   . Arthritis Mother     OA  . Hypertension Mother   . Alcohol abuse Father   . Hypertension Father   . Diabetes Father   . Lung cancer Maternal Grandmother   . Stroke Maternal Grandfather   . Hypertension Maternal Grandfather   . Diabetes Maternal Grandfather   . Stroke Paternal Grandfather   . Breast cancer Cousin   . Diabetes Cousin    Social History  Substance Use Topics  . Smoking status: Current Every Day Smoker -- 1.00 packs/day for 15 years    Types: Cigarettes    Start date: 10/23/2001  . Smokeless tobacco: Never Used  . Alcohol Use: 0.0 oz/week    0 Standard drinks or equivalent per week     Comment: occassinal   OB History    Gravida Para Term Preterm AB TAB SAB Ectopic Multiple Living   5 3             Review of Systems   Gastrointestinal: Negative for nausea, vomiting and abdominal pain.  Genitourinary: Positive for vaginal discharge. Negative for dysuria and frequency.  all other systems negative    Allergies  Sulfa antibiotics  Home Medications   Prior to Admission medications   Medication Sig Start Date End Date Taking? Authorizing Provider  acetaminophen (TYLENOL) 500 MG tablet Take 1,000 mg by mouth every 6 (six) hours as needed for pain.    Yes Historical Provider, MD  cyclobenzaprine (FLEXERIL) 10 MG tablet Take 1 tablet (10 mg total) by mouth 3 (three) times daily as needed for muscle spasms. Patient not taking: Reported on 05/12/2016 02/13/16   Tatyana Kirichenko, PA-C  metroNIDAZOLE (FLAGYL) 500 MG tablet Take 1 tablet (500 mg total) by mouth 2 (two) times daily. 05/12/16   Helmuth Recupero Orlene Och, NP  naproxen (NAPROSYN) 500 MG tablet Take 1 tablet (500 mg total) by mouth 2 (two) times daily. Patient not taking: Reported on 05/12/2016 02/13/16   Tatyana Kirichenko, PA-C   BP 108/72 mmHg  Pulse 105  Temp(Src) 97.3 F (36.3 C) (Oral)  Resp 16  Ht  (1.6 m)  Wt 86.183 kg  BMI 33.67 kg/m2  SpO2 97%  LMP  05/03/2016 Physical Exam  Constitutional: She is oriented to person, place, and time. She appears well-developed and well-nourished. No distress.  HENT:  Head: Normocephalic.  Eyes: EOM are normal.  Neck: Neck supple.  Cardiovascular: Normal rate.   Pulmonary/Chest: Effort normal.  Abdominal: Soft. There is no tenderness.  Genitourinary:  External genitalia without lesions, white d/c vaginal vault, no CMT, no adnexal tenderness, uterus without palpable enlargement.   Musculoskeletal: Normal range of motion.  Neurological: She is alert and oriented to person, place, and time. No cranial nerve deficit.  Skin: Skin is warm and dry.  Psychiatric: She has a normal mood and affect. Her behavior is normal.  Nursing note and vitals reviewed.   ED Course  Procedures (including critical care  time) Labs Review Labs Reviewed  WET PREP, GENITAL - Abnormal; Notable for the following:    Clue Cells Wet Prep HPF POC PRESENT (*)    WBC, Wet Prep HPF POC FEW (*)    All other components within normal limits  GC/CHLAMYDIA PROBE AMP ([place]) NOT [facility]    Imaging Review No results found. I have personally reviewed and evaluated the lab results as part of my medical decision-making.   MDM  30 y.o. female with vaginal d/c and concern for possible STD exposure stable for d/c without abdominal pain, fever or other signs of PID. Treated with Rocephin 250 mg IM and Zithromax 1 gram PO and Rx Flagyl. She will f/u with [facility]. She will return here as needed.   Final diagnoses:  Bacterial vaginosis  Concern about STD in female without diagnosis        Janne NapoleonHope M Elizabethann Lackey, NP 05/12/16 1056  Bethann BerkshireJoseph Zammit, MD 05/13/16 1321

## 2016-05-12 NOTE — ED Notes (Signed)
Pt comes in for vaginal itching starting over 1 week ago. Pt states she took OTC medication for yeast infection with no relief. Denies any discharge or lesions.

## 2016-05-15 LAB — GC/CHLAMYDIA PROBE AMP (~~LOC~~) NOT AT ARMC
Chlamydia: NEGATIVE
Neisseria Gonorrhea: NEGATIVE

## 2016-10-26 ENCOUNTER — Encounter (HOSPITAL_COMMUNITY): Payer: Self-pay | Admitting: Emergency Medicine

## 2016-10-26 ENCOUNTER — Emergency Department (HOSPITAL_COMMUNITY): Payer: Self-pay

## 2016-10-26 ENCOUNTER — Emergency Department (HOSPITAL_COMMUNITY)
Admission: EM | Admit: 2016-10-26 | Discharge: 2016-10-26 | Disposition: A | Discharge status: Home / Self Care | Payer: Self-pay | Attending: Emergency Medicine | Admitting: Emergency Medicine

## 2016-10-26 DIAGNOSIS — J029 Acute pharyngitis, unspecified: Secondary | ICD-10-CM | POA: Insufficient documentation

## 2016-10-26 DIAGNOSIS — F17210 Nicotine dependence, cigarettes, uncomplicated: Secondary | ICD-10-CM | POA: Insufficient documentation

## 2016-10-26 DIAGNOSIS — R067 Sneezing: Secondary | ICD-10-CM | POA: Insufficient documentation

## 2016-10-26 DIAGNOSIS — R059 Cough, unspecified: Secondary | ICD-10-CM

## 2016-10-26 DIAGNOSIS — J3489 Other specified disorders of nose and nasal sinuses: Secondary | ICD-10-CM | POA: Insufficient documentation

## 2016-10-26 DIAGNOSIS — Z79899 Other long term (current) drug therapy: Secondary | ICD-10-CM | POA: Insufficient documentation

## 2016-10-26 DIAGNOSIS — R05 Cough: Secondary | ICD-10-CM | POA: Insufficient documentation

## 2016-10-26 MED ORDER — CETIRIZINE HCL 10 MG PO TABS: 10.0000 mg | ORAL_TABLET | Freq: Every day | ORAL | 1 refills | Status: DC

## 2016-10-26 MED ORDER — BENZONATATE 100 MG PO CAPS: 200.0000 mg | ORAL_CAPSULE | Freq: Two times a day (BID) | ORAL | 0 refills | Status: DC | PRN

## 2016-10-26 NOTE — ED Provider Notes (Signed)
[facility]-EMERGENCY DEPT Provider Note   CSN: [id number] Arrival date & time: 10/26/16  1141     History   Chief Complaint Chief Complaint  Patient presents with  . Cough    HPI Tracie Aguilar is a 31 y.o. female.  Patient presents to the emergency department with chief complaint cough. She states that the cough started last night. She states that she was also awakened from sleep in the middle of the night with coughing fit. She states that this scared her, and caused her to come to the emergency department. She is in everyday smoker. She does not have a history of asthma, or COPD. She denies any fevers, or chills. She reports associated sore throat, and rhinorrhea. She denies ever having history of PE, or ACS. She states that she "may have had a blood clot in her leg many years ago." She describes this as having a lot of bruising on the outside of her leg.   The history is provided by the patient. No language interpreter was used.    Past Medical History:  Diagnosis Date  . Anxiety   . Depression   . Kidney stones 2007   passed x2  . Post traumatic stress disorder    prior medications     Patient Active Problem List   Diagnosis Date Noted  . Strep pharyngitis 12/21/2015  . Abdominal pain 07/21/2015  . Dizziness 07/21/2015  . Depression 07/21/2015  . Encounter for smoking cessation counseling 07/21/2015  . Vaginal discharge 07/21/2015  . Health care maintenance 07/21/2015    Past Surgical History:  Procedure Laterality Date  . TUBAL LIGATION  2013    OB History    Gravida Para Term Preterm AB Living   5 3           SAB TAB Ectopic Multiple Live Births                   Home Medications    Prior to Admission medications   Medication Sig Start Date End Date Taking? Authorizing Provider  acetaminophen (TYLENOL) 500 MG tablet Take 1,000 mg by mouth every 6 (six) hours as needed for pain.     Historical Provider, MD  benzonatate (TESSALON) 100 MG capsule Take  2 capsules (200 mg total) by mouth 2 (two) times daily as needed for cough. 10/26/16   Roxy Horsemanobert Freman Lapage, PA-C  cetirizine (ZYRTEC ALLERGY) 10 MG tablet Take 1 tablet (10 mg total) by mouth daily. 10/26/16   Roxy Horsemanobert Calla Wedekind, PA-C  cyclobenzaprine (FLEXERIL) 10 MG tablet Take 1 tablet (10 mg total) by mouth 3 (three) times daily as needed for muscle spasms. Patient not taking: Reported on 05/12/2016 02/13/16   Tatyana Kirichenko, PA-C  metroNIDAZOLE (FLAGYL) 500 MG tablet Take 1 tablet (500 mg total) by mouth 2 (two) times daily. 05/12/16   Hope Orlene OchM Neese, NP  naproxen (NAPROSYN) 500 MG tablet Take 1 tablet (500 mg total) by mouth 2 (two) times daily. Patient not taking: Reported on 05/12/2016 02/13/16   Jaynie Crumbleatyana Kirichenko, PA-C    Family History Family History  Problem Relation Age of Onset  . COPD Mother   . Alcohol abuse Mother   . Arthritis Mother     OA  . Hypertension Mother   . Alcohol abuse Father   . Hypertension Father   . Diabetes Father   . Lung cancer Maternal Grandmother   . Stroke Maternal Grandfather   . Hypertension Maternal Grandfather   . Diabetes Maternal Grandfather   .  Stroke Paternal Grandfather   . Breast cancer Cousin   . Diabetes Cousin     Social History Social History  Substance Use Topics  . Smoking status: Current Every Day Smoker    Packs/day: 1.00    Years: 15.00    Types: Cigarettes    Start date: 10/23/2001  . Smokeless tobacco: Never Used  . Alcohol use 0.0 oz/week     Comment: occassinal     Allergies   Sulfa antibiotics   Review of Systems Review of Systems  Constitutional: Positive for chills. Negative for fever.  HENT: Positive for postnasal drip, rhinorrhea, sinus pressure, sneezing and sore throat.   Respiratory: Positive for cough. Negative for shortness of breath.   Cardiovascular: Negative for chest pain.  Gastrointestinal: Negative for abdominal pain, constipation, diarrhea, nausea and vomiting.  Genitourinary: Negative for dysuria.    All other systems reviewed and are negative.    Physical Exam Updated Vital Signs BP 126/73 (BP Location: Left Arm)   Pulse 100   Temp 98 F (36.7 C) (Oral)   Resp 16   Ht 5\' 3"  (1.6 m)   Wt 86.6 kg   LMP 09/14/2016   SpO2 100%   BMI 33.83 kg/m   Physical Exam Physical Exam  Constitutional: Pt  is oriented to person, place, and time. Appears well-developed and well-nourished. No distress.  HENT:  Head: Normocephalic and atraumatic.  Right Ear: Tympanic membrane, external ear and ear canal normal.  Left Ear: Tympanic membrane, external ear and ear canal normal.  Nose: Mucosal edema and mild rhinorrhea present. No epistaxis. Right sinus exhibits no maxillary sinus tenderness and no frontal sinus tenderness. Left sinus exhibits no maxillary sinus tenderness and no frontal sinus tenderness.  Mouth/Throat: Uvula is midline and mucous membranes are normal. Mucous membranes are not pale and not cyanotic. No oropharyngeal exudate, posterior oropharyngeal edema, posterior oropharyngeal erythema or tonsillar abscesses.  Eyes: Conjunctivae are normal. Pupils are equal, round, and reactive to light.  Neck: Normal range of motion and full passive range of motion without pain.  Cardiovascular: Normal rate and intact distal pulses.   Pulmonary/Chest: Effort normal and breath sounds normal. No stridor.  Clear and equal breath sounds without focal wheezes, rhonchi, rales  Anterior chest wall mildly tender to palpation on the right side Abdominal: Soft. Bowel sounds are normal. There is no tenderness.  Musculoskeletal: Normal range of motion.  Lymphadenopathy:    Pthas no cervical adenopathy.  Neurological: Pt is alert and oriented to person, place, and time.  Skin: Skin is warm and dry. No rash noted. Pt is not diaphoretic.  Psychiatric: Normal mood and affect.  Nursing note and vitals reviewed.    ED Treatments / Results  Labs (all labs ordered are listed, but only abnormal results  are displayed) Labs Reviewed - No data to display  EKG  EKG Interpretation None       Radiology Dg Chest 2 View  Result Date: 10/26/2016 CLINICAL DATA:  Pain. EXAM: CHEST  2 VIEW COMPARISON:  No recent prior. FINDINGS: The heart size and mediastinal contours are within normal limits. Both lungs are clear. The visualized skeletal structures are unremarkable. IMPRESSION: No active cardiopulmonary disease. Electronically Signed   By: Maisie Fus  Register   On: 10/26/2016 12:29    Procedures Procedures (including critical care time)  Medications Ordered in ED Medications - No data to display   Initial Impression / Assessment and Plan / ED Course  I have reviewed the triage vital signs and  the nursing notes.  Pertinent labs & imaging results that were available during my care of the patient were reviewed by me and considered in my medical decision making (see chart for details).  Clinical Course    Patient symptoms are consistent with URI and cough. She is not hypoxic. Heart rate is not greater than 100. She is not using birth control (tubal ligation). She is not having chest pain. At the very low suspicion for PE or ACS given age and risk factors. Well's criteria is 0-1.5 dependent upon whether she truly had a DVT in the past. Her symptoms when she describes this, seemed more consistent with hematoma. Even so, well's criteria at 1.5 is low risk for PE in ED population. Pt CXR negative for acute infiltrate. Patients symptoms are consistent with URI, likely viral etiology. Discussed that antibiotics are not indicated for viral infections. Pt will be discharged with symptomatic treatment.  Verbalizes understanding and is agreeable with plan. Pt is hemodynamically stable & in NAD prior to dc.   Final Clinical Impressions(s) / ED Diagnoses   Final diagnoses:  Cough    New Prescriptions New Prescriptions   BENZONATATE (TESSALON) 100 MG CAPSULE    Take 2 capsules (200 mg total) by mouth 2  (two) times daily as needed for cough.   CETIRIZINE (ZYRTEC ALLERGY) 10 MG TABLET    Take 1 tablet (10 mg total) by mouth daily.     Roxy Horseman, PA-C 10/26/16 1256    Jacalyn Lefevre, MD 10/26/16 1414

## 2016-10-26 NOTE — ED Notes (Signed)
Pt made aware to return if symptoms worsen or if any life threatening symptoms occur.   

## 2016-10-26 NOTE — ED Triage Notes (Signed)
Coughing, non productive, today hurting in upper back

## 2016-12-27 ENCOUNTER — Emergency Department (HOSPITAL_COMMUNITY): Payer: Self-pay

## 2016-12-27 ENCOUNTER — Emergency Department (HOSPITAL_COMMUNITY)
Admission: EM | Admit: 2016-12-27 | Discharge: 2016-12-27 | Disposition: A | Discharge status: Home / Self Care | Payer: Self-pay | Attending: Emergency Medicine | Admitting: Emergency Medicine

## 2016-12-27 ENCOUNTER — Encounter (HOSPITAL_COMMUNITY): Payer: Self-pay | Admitting: Emergency Medicine

## 2016-12-27 DIAGNOSIS — R079 Chest pain, unspecified: Secondary | ICD-10-CM | POA: Insufficient documentation

## 2016-12-27 DIAGNOSIS — F17210 Nicotine dependence, cigarettes, uncomplicated: Secondary | ICD-10-CM | POA: Insufficient documentation

## 2016-12-27 LAB — BASIC METABOLIC PANEL
Anion gap: 6 (ref 5–15)
BUN: 12 mg/dL (ref 6–20)
CALCIUM: 8.8 mg/dL — AB (ref 8.9–10.3)
CO2: 24 mmol/L (ref 22–32)
CREATININE: 0.53 mg/dL (ref 0.44–1.00)
Chloride: 107 mmol/L (ref 101–111)
GFR calc non Af Amer: >60 mL/min (ref >60)
Glucose, Bld: 89 mg/dL (ref 65–99)
Potassium: 3.6 mmol/L (ref 3.5–5.1)
Sodium: 137 mmol/L (ref 135–145)

## 2016-12-27 LAB — CBC
HEMATOCRIT: 39.5 % (ref 36.0–46.0)
Hemoglobin: 13.4 g/dL (ref 12.0–15.0)
MCH: 30 pg (ref 26.0–34.0)
MCHC: 33.9 g/dL (ref 30.0–36.0)
MCV: 88.6 fL (ref 78.0–100.0)
Platelets: 295 10*3/uL (ref 150–400)
RBC: 4.46 MIL/uL (ref 3.87–5.11)
RDW: 13.2 % (ref 11.5–15.5)
WBC: 9.4 10*3/uL (ref 4.0–10.5)

## 2016-12-27 LAB — I-STAT TROPONIN, ED: Troponin i, poc: 0 ng/mL (ref 0.00–0.08)

## 2016-12-27 MED ORDER — IBUPROFEN 600 MG PO TABS: 600.0000 mg | ORAL_TABLET | Freq: Four times a day (QID) | ORAL | 0 refills | Status: DC | PRN

## 2016-12-27 NOTE — ED Triage Notes (Signed)
Pt here from work , pt c/o chest pain right sided , hyperventilating with ems , no n/v

## 2016-12-27 NOTE — Discharge Instructions (Addendum)
Please read and follow all provided instructions.  Your diagnoses today include:  1. Chest pain, unspecified type     Tests performed today include: An EKG of your heart A chest x-ray Cardiac enzymes - a blood test for heart muscle damage Blood counts and electrolytes Vital signs. See below for your results today.   Medications prescribed:   Take any prescribed medications only as directed.  Follow-up instructions: Please follow-up with your primary care provider as soon as you can for further evaluation of your symptoms.   Return instructions:  SEEK IMMEDIATE MEDICAL ATTENTION IF: You have severe chest pain, especially if the pain is crushing or pressure-like and spreads to the arms, back, neck, or jaw, or if you have sweating, nausea (feeling sick to your stomach), or shortness of breath. THIS IS AN EMERGENCY. Don't wait to see if the pain will go away. Get medical help at once. Call 911 or 0 (operator). DO NOT drive yourself to the hospital.  Your chest pain gets worse and does not go away with rest.  You have an attack of chest pain lasting longer than usual, despite rest and treatment with the medications your caregiver has prescribed.  You wake from sleep with chest pain or shortness of breath. You feel dizzy or faint. You have chest pain not typical of your usual pain for which you originally saw your caregiver.  You have any other emergent concerns regarding your health.  Additional Information: Chest pain comes from many different causes. Your caregiver has diagnosed you as having chest pain that is not specific for one problem, but does not require admission.  You are at low risk for an acute heart condition or other serious illness.   Your vital signs today were: BP 103/72    Pulse 75    Temp 97.8 F (36.6 C) (Oral)    Resp 15    LMP 12/20/2016    SpO2 100%  If your blood pressure (BP) was elevated above 135/85 this visit, please have this repeated by your doctor within  one month. --------------

## 2016-12-27 NOTE — ED Notes (Signed)
Patient transported to X-ray 

## 2016-12-27 NOTE — ED Provider Notes (Signed)
[facility]-EMERGENCY DEPT Provider Note   CSN: [id number] Arrival date & time: 12/27/16  1109     History   Chief Complaint Chief Complaint  Patient presents with  . Chest Pain    HPI Tracie Aguilar is a 31 y.o. female.  HPI  31 y.o. female with a hx of PTSD, presents to the Emergency Department today complaining of chest pain on right side PTA. Occurred around 0900 while at work. Lasted several hours. Hx same in past. Arrived via EMS. Hyperventilating noted on scene. No N/V. No diaphoresis. No ABD pain. No pain currently. EMS gave NTG SL and ASA with no change. No hx ACS. No FH. No recent surgeries. No recent travel. No hx DVT/PE. NO estrogen use. No cough. No URI symptoms. No other symptoms noted.   Past Medical History:  Diagnosis Date  . Anxiety   . Depression   . Kidney stones 2007   passed x2  . Post traumatic stress disorder    prior medications     Patient Active Problem List   Diagnosis Date Noted  . Strep pharyngitis 12/21/2015  . Abdominal pain 07/21/2015  . Dizziness 07/21/2015  . Depression 07/21/2015  . Encounter for smoking cessation counseling 07/21/2015  . Vaginal discharge 07/21/2015  . Health care maintenance 07/21/2015    Past Surgical History:  Procedure Laterality Date  . TUBAL LIGATION  2013    OB History    Gravida Para Term Preterm AB Living   5 3           SAB TAB Ectopic Multiple Live Births                   Home Medications    Prior to Admission medications   Medication Sig Start Date End Date Taking? Authorizing Provider  acetaminophen (TYLENOL) 500 MG tablet Take 1,000 mg by mouth every 6 (six) hours as needed for pain.     Historical Provider, MD  benzonatate (TESSALON) 100 MG capsule Take 2 capsules (200 mg total) by mouth 2 (two) times daily as needed for cough. 10/26/16   Roxy Horseman, PA-C  cetirizine (ZYRTEC ALLERGY) 10 MG tablet Take 1 tablet (10 mg total) by mouth daily. 10/26/16   Roxy Horseman, PA-C  cyclobenzaprine  (FLEXERIL) 10 MG tablet Take 1 tablet (10 mg total) by mouth 3 (three) times daily as needed for muscle spasms. Patient not taking: Reported on 05/12/2016 02/13/16   Tatyana Kirichenko, PA-C  metroNIDAZOLE (FLAGYL) 500 MG tablet Take 1 tablet (500 mg total) by mouth 2 (two) times daily. 05/12/16   Hope Orlene Och, NP  naproxen (NAPROSYN) 500 MG tablet Take 1 tablet (500 mg total) by mouth 2 (two) times daily. Patient not taking: Reported on 05/12/2016 02/13/16   Jaynie Crumble, PA-C    Family History Family History  Problem Relation Age of Onset  . COPD Mother   . Alcohol abuse Mother   . Arthritis Mother     OA  . Hypertension Mother   . Alcohol abuse Father   . Hypertension Father   . Diabetes Father   . Lung cancer Maternal Grandmother   . Stroke Maternal Grandfather   . Hypertension Maternal Grandfather   . Diabetes Maternal Grandfather   . Stroke Paternal Grandfather   . Breast cancer Cousin   . Diabetes Cousin     Social History Social History  Substance Use Topics  . Smoking status: Current Every Day Smoker    Packs/day: 1.00  Years: 15.00    Types: Cigarettes    Start date: 10/23/2001  . Smokeless tobacco: Never Used  . Alcohol use 0.0 oz/week     Comment: occassinal     Allergies   Sulfa antibiotics   Review of Systems Review of Systems ROS reviewed and all are negative for acute change except as noted in the HPI.  Physical Exam Updated Vital Signs BP 118/84 (BP Location: Right Arm)   Pulse 84   Temp 97.8 F (36.6 C) (Oral)   Resp 16   SpO2 100%   Physical Exam  Constitutional: She is oriented to person, place, and time. She appears well-developed and well-nourished. No distress.  HENT:  Head: Normocephalic and atraumatic.  Right Ear: Tympanic membrane, external ear and ear canal normal.  Left Ear: Tympanic membrane, external ear and ear canal normal.  Nose: Nose normal.  Mouth/Throat: Uvula is midline, oropharynx is clear and moist and mucous  membranes are normal. No trismus in the jaw. No oropharyngeal exudate, posterior oropharyngeal erythema or tonsillar abscesses.  Eyes: EOM are normal. Pupils are equal, round, and reactive to light.  Neck: Normal range of motion. Neck supple. No tracheal deviation present.  Cardiovascular: Normal rate, regular rhythm, S1 normal, S2 normal, normal heart sounds, intact distal pulses and normal pulses.   Pulmonary/Chest: Effort normal and breath sounds normal. No respiratory distress. She has no decreased breath sounds. She has no wheezes. She has no rhonchi. She has no rales.  Abdominal: Normal appearance and bowel sounds are normal. There is no tenderness.  Musculoskeletal: Normal range of motion.  Neurological: She is alert and oriented to person, place, and time.  Skin: Skin is warm and dry.  Psychiatric: She has a normal mood and affect. Her speech is normal and behavior is normal. Thought content normal.   ED Treatments / Results  Labs (all labs ordered are listed, but only abnormal results are displayed) Labs Reviewed  BASIC METABOLIC PANEL - Abnormal; Notable for the following:       Result Value   Calcium 8.8 (*)    All other components within normal limits  CBC  I-STAT TROPOININ, ED    EKG  EKG Interpretation None       Radiology Dg Chest 2 View  Result Date: 12/27/2016 CLINICAL DATA:  Right side chest pain starting this morning EXAM: CHEST  2 VIEW COMPARISON:  10/26/2016 FINDINGS: Cardiomediastinal silhouette is stable. No infiltrate or pleural effusion. No pulmonary edema. Bony thorax is unremarkable. IMPRESSION: No active cardiopulmonary disease. Electronically Signed   By: Natasha Mead M.D.   On: 12/27/2016 11:54    Procedures Procedures (including critical care time)  Medications Ordered in ED Medications - No data to display   Initial Impression / Assessment and Plan / ED Course  I have reviewed the triage vital signs and the nursing notes.  Pertinent labs &  imaging results that were available during my care of the patient were reviewed by me and considered in my medical decision making (see chart for details).  Final Clinical Impressions(s) / ED Diagnoses  {I have reviewed and evaluated the relevant laboratory values. {I have reviewed and evaluated the relevant imaging studies. {I have interpreted the relevant EKG. {I have reviewed the relevant previous healthcare records. {I have reviewed EMS Documentation. {I obtained HPI from historian.   ED Course:  Assessment: Pt is a 31 y.o. female presents with CP PTA around 0900. Lasted several hours. No exacerbating factors. Hx same. Risk Factors for  ACS Smoking. No active CP currently. Patient is to be discharged with recommendation to follow up with PCP in regards to today's hospital visit. Chest pain is not likely of cardiac or pulmonary etiology d/t presentation, perc negative, VSS, no tracheal deviation, no JVD or new murmur, RRR, breath sounds equal bilaterally, EKG without acute abnormalities, negative troponin, and negative CXR. Likely costochondritis vs anxiety related. Pt low risk for ACS. Pt has been advised start a NSAIDs and return to the ED is CP becomes exertional, associated with diaphoresis or nausea, radiates to left jaw/arm, worsens or becomes concerning in any way. Pt appears reliable for follow up and is agreeable to discharge. Patient is in no acute distress. Vital Signs are stable. Patient is able to ambulate. Patient able to tolerate PO.   Disposition/Plan:  DC Home Additional Verbal discharge instructions given and discussed with patient.  Pt Instructed to f/u with PCP in the next week for evaluation and treatment of symptoms. Return precautions given Pt acknowledges and agrees with plan  Supervising Physician Melene Planan Floyd, DO  Final diagnoses:  Chest pain, unspecified type    New Prescriptions New Prescriptions   No medications on file     Audry Piliyler Byrl Latin, PA-C 12/27/16 1302      Melene Planan Floyd, DO 12/27/16 1311

## 2017-04-30 ENCOUNTER — Emergency Department (HOSPITAL_COMMUNITY): Payer: Self-pay

## 2017-04-30 ENCOUNTER — Emergency Department (HOSPITAL_COMMUNITY)
Admission: EM | Admit: 2017-04-30 | Discharge: 2017-05-01 | Disposition: A | Discharge status: Home / Self Care | Payer: Self-pay | Attending: Emergency Medicine | Admitting: Emergency Medicine

## 2017-04-30 ENCOUNTER — Other Ambulatory Visit: Payer: Self-pay

## 2017-04-30 ENCOUNTER — Encounter (HOSPITAL_COMMUNITY): Payer: Self-pay | Admitting: Emergency Medicine

## 2017-04-30 DIAGNOSIS — R55 Syncope and collapse: Secondary | ICD-10-CM | POA: Insufficient documentation

## 2017-04-30 DIAGNOSIS — R002 Palpitations: Secondary | ICD-10-CM | POA: Insufficient documentation

## 2017-04-30 DIAGNOSIS — F17210 Nicotine dependence, cigarettes, uncomplicated: Secondary | ICD-10-CM | POA: Insufficient documentation

## 2017-04-30 LAB — BASIC METABOLIC PANEL
ANION GAP: 8 (ref 5–15)
BUN: 11 mg/dL (ref 6–20)
CHLORIDE: 107 mmol/L (ref 101–111)
CO2: 23 mmol/L (ref 22–32)
Calcium: 9.2 mg/dL (ref 8.9–10.3)
Creatinine, Ser: 0.54 mg/dL (ref 0.44–1.00)
Glucose, Bld: 93 mg/dL (ref 65–99)
POTASSIUM: 3.6 mmol/L (ref 3.5–5.1)
SODIUM: 138 mmol/L (ref 135–145)

## 2017-04-30 LAB — CBC
HEMATOCRIT: 41.1 % (ref 36.0–46.0)
HEMOGLOBIN: 14.6 g/dL (ref 12.0–15.0)
MCH: 30.7 pg (ref 26.0–34.0)
MCHC: 35.5 g/dL (ref 30.0–36.0)
MCV: 86.5 fL (ref 78.0–100.0)
Platelets: 290 10*3/uL (ref 150–400)
RBC: 4.75 MIL/uL (ref 3.87–5.11)
RDW: 12.8 % (ref 11.5–15.5)
WBC: 10.4 10*3/uL (ref 4.0–10.5)

## 2017-04-30 LAB — POCT I-STAT TROPONIN I: Troponin i, poc: 0 ng/mL (ref 0.00–0.08)

## 2017-04-30 NOTE — ED Triage Notes (Signed)
patient c/o constant pressure in chest x past 3 days but has been intermittent over the past couple months.

## 2017-04-30 NOTE — ED Notes (Signed)
CALLED THREE TIMES WITH NO ANSWER 

## 2017-05-01 LAB — POCT PREGNANCY, URINE: Preg Test, Ur: NEGATIVE

## 2017-05-01 NOTE — ED Notes (Signed)
Pt requesting discharge information. Provider notified.

## 2017-05-01 NOTE — ED Provider Notes (Signed)
[facility]-EMERGENCY DEPT Provider Note   CSN: [id number] Arrival date & time: 04/30/17  1834     History   Chief Complaint Chief Complaint  Patient presents with  . Chest Pain    HPI Tracie Aguilar is a 31 y.o. female.  Patient presents with complaint of episodes of near syncope that has been intermittent/recurrent for the past 8 months. She reports being seen by her doctor and having thyroid studies in December of last year that were reported as normal. She reports being seen in the ED once in the interim and no answers were found. No further outpatient evaluation. She experiences chest tightness, weakness and a feeling like she is going to pass out that are associated with palpitations. Episodes occur randomly without inciting factors. They last for varying lengths of duration.    The history is provided by the patient. No language interpreter was used.    Past Medical History:  Diagnosis Date  . Anxiety   . Depression   . Kidney stones 2007   passed x2  . Post traumatic stress disorder    prior medications     Patient Active Problem List   Diagnosis Date Noted  . Strep pharyngitis 12/21/2015  . Abdominal pain 07/21/2015  . Dizziness 07/21/2015  . Depression 07/21/2015  . Encounter for smoking cessation counseling 07/21/2015  . Vaginal discharge 07/21/2015  . Health care maintenance 07/21/2015    Past Surgical History:  Procedure Laterality Date  . TUBAL LIGATION  2013    OB History    Gravida Para Term Preterm AB Living   5 3           SAB TAB Ectopic Multiple Live Births                   Home Medications    Prior to Admission medications   Medication Sig Start Date End Date Taking? Authorizing Provider  benzonatate (TESSALON) 100 MG capsule Take 2 capsules (200 mg total) by mouth 2 (two) times daily as needed for cough. Patient not taking: Reported on 12/27/2016 10/26/16   Roxy Horseman, PA-C  cetirizine (ZYRTEC ALLERGY) 10 MG tablet Take 1 tablet  (10 mg total) by mouth daily. Patient not taking: Reported on 12/27/2016 10/26/16   Roxy Horseman, PA-C  cyclobenzaprine (FLEXERIL) 10 MG tablet Take 1 tablet (10 mg total) by mouth 3 (three) times daily as needed for muscle spasms. Patient not taking: Reported on 05/12/2016 02/13/16   Jaynie Crumble, PA-C  ibuprofen (ADVIL,MOTRIN) 600 MG tablet Take 1 tablet (600 mg total) by mouth every 6 (six) hours as needed. 12/27/16   Audry Pili, PA-C  naproxen (NAPROSYN) 500 MG tablet Take 1 tablet (500 mg total) by mouth 2 (two) times daily. Patient not taking: Reported on 05/12/2016 02/13/16   Jaynie Crumble, PA-C    Family History Family History  Problem Relation Age of Onset  . COPD Mother   . Alcohol abuse Mother   . Arthritis Mother        OA  . Hypertension Mother   . Alcohol abuse Father   . Hypertension Father   . Diabetes Father   . Lung cancer Maternal Grandmother   . Stroke Maternal Grandfather   . Hypertension Maternal Grandfather   . Diabetes Maternal Grandfather   . Stroke Paternal Grandfather   . Breast cancer Cousin   . Diabetes Cousin     Social History Social History  Substance Use Topics  . Smoking status: Current Every Day  Smoker    Packs/day: 1.00    Years: 15.00    Types: Cigarettes    Start date: 10/23/2001  . Smokeless tobacco: Never Used  . Alcohol use 0.0 oz/week     Comment: occassinal     Allergies   Sulfa antibiotics   Review of Systems Review of Systems  Constitutional: Negative for chills and fever.  Respiratory: Positive for chest tightness and shortness of breath.   Cardiovascular: Positive for palpitations.  Gastrointestinal: Negative.  Negative for vomiting.  Musculoskeletal: Negative.   Skin: Negative.   Neurological: Negative for syncope.       Near syncope     Physical Exam Updated Vital Signs BP 102/63 (BP Location: Left Arm)   Pulse 77   Temp 98.5 F (36.9 C) (Oral)   Resp 18   Ht 5\' 3"  (1.6 m)   Wt 88 kg (194 lb)    LMP 03/30/2017 Comment: tubuligation  SpO2 100%   BMI 34.37 kg/m   Physical Exam  Constitutional: She is oriented to person, place, and time. She appears well-developed and well-nourished.  HENT:  Head: Normocephalic.  Neck: Normal range of motion. Neck supple.  Cardiovascular: Normal rate and regular rhythm.   No murmur heard. Pulmonary/Chest: Effort normal and breath sounds normal. She has no wheezes. She has no rales.  Abdominal: Soft. Bowel sounds are normal. There is no tenderness. There is no rebound and no guarding.  Musculoskeletal: Normal range of motion.  Neurological: She is alert and oriented to person, place, and time.  Skin: Skin is warm and dry. No rash noted.  Psychiatric: She has a normal mood and affect.     ED Treatments / Results  Labs (all labs ordered are listed, but only abnormal results are displayed) Labs Reviewed  BASIC METABOLIC PANEL  CBC  I-STAT TROPOININ, ED  POCT I-STAT TROPONIN I   Results for orders placed or performed during the hospital encounter of 04/30/17  Basic metabolic panel  Result Value Ref Range   Sodium 138 135 - 145 mmol/L   Potassium 3.6 3.5 - 5.1 mmol/L   Chloride 107 101 - 111 mmol/L   CO2 23 22 - 32 mmol/L   Glucose, Bld 93 65 - 99 mg/dL   BUN 11 6 - 20 mg/dL   Creatinine, Ser 0.63 0.44 - 1.00 mg/dL   Calcium 9.2 8.9 - 01.6 mg/dL   GFR calc non Af Amer >60 >60 mL/min   GFR calc Af Amer >60 >60 mL/min   Anion gap 8 5 - 15  CBC  Result Value Ref Range   WBC 10.4 4.0 - 10.5 K/uL   RBC 4.75 3.87 - 5.11 MIL/uL   Hemoglobin 14.6 12.0 - 15.0 g/dL   HCT 01.0 93.2 - 35.5 %   MCV 86.5 78.0 - 100.0 fL   MCH 30.7 26.0 - 34.0 pg   MCHC 35.5 30.0 - 36.0 g/dL   RDW 73.2 20.2 - 54.2 %   Platelets 290 150 - 400 K/uL  POCT i-Stat troponin I  Result Value Ref Range   Troponin i, poc 0.00 0.00 - 0.08 ng/mL   Comment 3          Pregnancy, urine POC  Result Value Ref Range   Preg Test, Ur NEGATIVE NEGATIVE    EKG  EKG  Interpretation None       Radiology Dg Chest 2 View  Result Date: 04/30/2017 CLINICAL DATA:  Chest pain.  Chest pressure for 3 days. EXAM: CHEST  2 VIEW COMPARISON:  12/27/2016 FINDINGS: The cardiomediastinal contours are normal. The lungs are clear. Pulmonary vasculature is normal. No consolidation, pleural effusion, or pneumothorax. No acute osseous abnormalities are seen. IMPRESSION: Unremarkable radiographs of the chest. Electronically Signed   By: Rubye OaksMelanie  Ehinger M.D.   On: 04/30/2017 19:21    Procedures Procedures (including critical care time)  Medications Ordered in ED Medications - No data to display   Initial Impression / Assessment and Plan / ED Course  I have reviewed the triage vital signs and the nursing notes.  Pertinent labs & imaging results that were available during my care of the patient were reviewed by me and considered in my medical decision making (see chart for details).     Patient presents with chronic symptoms for the past several months of episodes of syncope/near syncope, palpitations, feeling flushed. She reports being concerned about being at home while caring for her children.   She becomes increasingly agitated during HPI. She does not feel that EKG "for just a minute" shows anything. She reports she is "not leaving here before we provide answers". Discussed that we would insure no acute or dangerous condition present but answers for chronic symptoms are sometimes not found in the ED. She is argumentative and angry. This provider left the room without further conversation.   Review of chart shows evaluation in March of this year for nonspecific chest pain. She reports no follow up after hospital visit despite being referred to PCP. She also reports being seen by PCP late last year for same symptoms and that her thyroid tests were normal at that time.   Labs here are normal. EKG is NSR. She is not pregnant. She is felt stable for outpatient evaluation  to determine cause of symptoms.   At time of discharge, the patient remains unhappy with service stating "I've been here for 6 hours and no one's done shit for me". She is demanding to be discharged.   Final Clinical Impressions(s) / ED Diagnoses   Final diagnoses:  None   1. Palpitations 2. Near syncope   New Prescriptions New Prescriptions   No medications on file     Elpidio AnisUpstill, Wylee Dorantes, Cordelia Poche-C 05/01/17 0211    Elpidio AnisUpstill, Morghan Kester, PA-C 05/01/17 0215    Molpus, Jonny RuizJohn, MD 05/01/17 [phone]

## 2017-05-01 NOTE — ED Notes (Signed)
Pt reports that she would not sign discharge instructions because she felt that she was not treated during visit. Labs, EKG, CXR, orthostatic vital signs were obtained during visit and no acute findings were seen. Melvenia BeamShari PA was notified of pt statement of not being treated. Pt ambulated out of department without any difficulty.

## 2017-06-02 ENCOUNTER — Encounter (HOSPITAL_COMMUNITY): Payer: Self-pay | Admitting: Emergency Medicine

## 2017-06-02 ENCOUNTER — Emergency Department (HOSPITAL_COMMUNITY)
Admission: EM | Admit: 2017-06-02 | Discharge: 2017-06-02 | Disposition: A | Discharge status: Other Acute Inpatient Hospital | Payer: Self-pay | Attending: Emergency Medicine | Admitting: Emergency Medicine

## 2017-06-02 ENCOUNTER — Inpatient Hospital Stay (HOSPITAL_COMMUNITY)
Admission: AD | Admit: 2017-06-02 | Discharge: 2017-06-05 | DRG: 882 | Disposition: A | Discharge status: Home / Self Care | Payer: No Typology Code available for payment source | Source: Intra-hospital | Attending: Psychiatry | Admitting: Psychiatry

## 2017-06-02 ENCOUNTER — Encounter (HOSPITAL_COMMUNITY): Payer: Self-pay | Admitting: *Deleted

## 2017-06-02 DIAGNOSIS — F17210 Nicotine dependence, cigarettes, uncomplicated: Secondary | ICD-10-CM | POA: Diagnosis present

## 2017-06-02 DIAGNOSIS — G4700 Insomnia, unspecified: Secondary | ICD-10-CM | POA: Diagnosis present

## 2017-06-02 DIAGNOSIS — F419 Anxiety disorder, unspecified: Secondary | ICD-10-CM | POA: Diagnosis present

## 2017-06-02 DIAGNOSIS — R45851 Suicidal ideations: Secondary | ICD-10-CM | POA: Insufficient documentation

## 2017-06-02 DIAGNOSIS — F4310 Post-traumatic stress disorder, unspecified: Principal | ICD-10-CM | POA: Diagnosis present

## 2017-06-02 DIAGNOSIS — Z818 Family history of other mental and behavioral disorders: Secondary | ICD-10-CM | POA: Diagnosis not present

## 2017-06-02 DIAGNOSIS — Z79899 Other long term (current) drug therapy: Secondary | ICD-10-CM | POA: Diagnosis not present

## 2017-06-02 DIAGNOSIS — Z91410 Personal history of adult physical and sexual abuse: Secondary | ICD-10-CM

## 2017-06-02 DIAGNOSIS — F329 Major depressive disorder, single episode, unspecified: Secondary | ICD-10-CM | POA: Insufficient documentation

## 2017-06-02 DIAGNOSIS — F332 Major depressive disorder, recurrent severe without psychotic features: Secondary | ICD-10-CM | POA: Diagnosis present

## 2017-06-02 DIAGNOSIS — F322 Major depressive disorder, single episode, severe without psychotic features: Secondary | ICD-10-CM | POA: Diagnosis present

## 2017-06-02 LAB — COMPREHENSIVE METABOLIC PANEL
ALT: 12 U/L — ABNORMAL LOW (ref 14–54)
AST: 16 U/L (ref 15–41)
Albumin: 4.3 g/dL (ref 3.5–5.0)
Alkaline Phosphatase: 73 U/L (ref 38–126)
Anion gap: 8 (ref 5–15)
BUN: 11 mg/dL (ref 6–20)
CHLORIDE: 109 mmol/L (ref 101–111)
CO2: 24 mmol/L (ref 22–32)
Calcium: 9.6 mg/dL (ref 8.9–10.3)
Creatinine, Ser: 0.55 mg/dL (ref 0.44–1.00)
Glucose, Bld: 103 mg/dL — ABNORMAL HIGH (ref 65–99)
POTASSIUM: 4.1 mmol/L (ref 3.5–5.1)
SODIUM: 141 mmol/L (ref 135–145)
Total Bilirubin: 0.5 mg/dL (ref 0.3–1.2)
Total Protein: 7.6 g/dL (ref 6.5–8.1)

## 2017-06-02 LAB — CBC WITH DIFFERENTIAL/PLATELET
Basophils Absolute: 0 10*3/uL (ref 0.0–0.1)
Basophils Relative: 0 %
EOS ABS: 0.2 10*3/uL (ref 0.0–0.7)
EOS PCT: 2 %
HCT: 41.7 % (ref 36.0–46.0)
HEMOGLOBIN: 14.1 g/dL (ref 12.0–15.0)
LYMPHS ABS: 2.2 10*3/uL (ref 0.7–4.0)
Lymphocytes Relative: 24 %
MCH: 29.7 pg (ref 26.0–34.0)
MCHC: 33.8 g/dL (ref 30.0–36.0)
MCV: 87.8 fL (ref 78.0–100.0)
MONO ABS: 0.5 10*3/uL (ref 0.1–1.0)
MONOS PCT: 6 %
Neutro Abs: 6.2 10*3/uL (ref 1.7–7.7)
Neutrophils Relative %: 68 %
PLATELETS: 337 10*3/uL (ref 150–400)
RBC: 4.75 MIL/uL (ref 3.87–5.11)
RDW: 12.6 % (ref 11.5–15.5)
WBC: 9.1 10*3/uL (ref 4.0–10.5)

## 2017-06-02 LAB — RAPID URINE DRUG SCREEN, HOSP PERFORMED
AMPHETAMINES: NOT DETECTED
BENZODIAZEPINES: NOT DETECTED
Barbiturates: NOT DETECTED
COCAINE: NOT DETECTED
OPIATES: NOT DETECTED
Tetrahydrocannabinol: POSITIVE — AB

## 2017-06-02 LAB — I-STAT BETA HCG BLOOD, ED (MC, WL, AP ONLY)

## 2017-06-02 LAB — ETHANOL

## 2017-06-02 MED ORDER — NICOTINE POLACRILEX 2 MG MT GUM: 2.0000 mg | CHEWING_GUM | OROMUCOSAL | Status: DC | PRN

## 2017-06-02 MED ORDER — ALUM & MAG HYDROXIDE-SIMETH 200-200-20 MG/5ML PO SUSP: 30.0000 mL | ORAL | Status: DC | PRN

## 2017-06-02 MED ORDER — ACETAMINOPHEN 325 MG PO TABS
650.0000 mg | ORAL_TABLET | Freq: Four times a day (QID) | ORAL | Status: DC | PRN
  Administered 2017-06-02: 650 mg via ORAL
  Filled 2017-06-02: qty 2

## 2017-06-02 MED ORDER — HYDROXYZINE HCL 25 MG PO TABS
25.0000 mg | ORAL_TABLET | Freq: Three times a day (TID) | ORAL | Status: DC | PRN
  Administered 2017-06-02 – 2017-06-04 (×6): 25 mg via ORAL
  Filled 2017-06-02 (×6): qty 1

## 2017-06-02 MED ORDER — NICOTINE 21 MG/24HR TD PT24
21.0000 mg | MEDICATED_PATCH | Freq: Every day | TRANSDERMAL | Status: DC
  Administered 2017-06-02: 21 mg via TRANSDERMAL
  Filled 2017-06-02: qty 1

## 2017-06-02 MED ORDER — ONDANSETRON HCL 4 MG PO TABS: 4.0000 mg | ORAL_TABLET | Freq: Three times a day (TID) | ORAL | Status: DC | PRN

## 2017-06-02 MED ORDER — ALUM & MAG HYDROXIDE-SIMETH 200-200-20 MG/5ML PO SUSP: 30.0000 mL | Freq: Four times a day (QID) | ORAL | Status: DC | PRN

## 2017-06-02 MED ORDER — IBUPROFEN 400 MG PO TABS
600.0000 mg | ORAL_TABLET | Freq: Three times a day (TID) | ORAL | Status: DC | PRN
  Administered 2017-06-02: 600 mg via ORAL
  Filled 2017-06-02 (×2): qty 2

## 2017-06-02 MED ORDER — MAGNESIUM HYDROXIDE 400 MG/5ML PO SUSP: 30.0000 mL | Freq: Every day | ORAL | Status: DC | PRN

## 2017-06-02 MED ORDER — TRAZODONE HCL 50 MG PO TABS
50.0000 mg | ORAL_TABLET | Freq: Every evening | ORAL | Status: DC | PRN
  Administered 2017-06-03 – 2017-06-04 (×2): 50 mg via ORAL
  Filled 2017-06-02 (×2): qty 1
  Filled 2017-06-02: qty 7

## 2017-06-02 NOTE — BH Assessment (Signed)
[facility] bed [facility]1. Attending MD Cobos, please call report to [phone].

## 2017-06-02 NOTE — Tx Team (Signed)
Initial Treatment Plan 06/02/2017 9:32 PM Tracie Aguilar [id number]    PATIENT STRESSORS: Financial difficulties Marital or family conflict Occupational concerns   PATIENT STRENGTHS: Ability for insight Average or above average intelligence Capable of independent living General fund of knowledge   PATIENT IDENTIFIED PROBLEMS: Depression Suicidal thoughts "The situation that I'm in, I'm all alone"                     DISCHARGE CRITERIA:  Ability to meet basic life and health needs Improved stabilization in mood, thinking, and/or behavior Verbal commitment to aftercare and medication compliance  PRELIMINARY DISCHARGE PLAN: Attend aftercare/continuing care group Return to previous living arrangement  PATIENT/FAMILY INVOLVEMENT: This treatment plan has been presented to and reviewed with the patient, Tracie SakeGloria D Aguilar, and/or family member, .  The patient and family have been given the opportunity to ask questions and make suggestions.  Rosaria Kubin, [place], [place] 06/02/2017, 9:32 PM

## 2017-06-02 NOTE — BH Assessment (Signed)
[facility] Assessment Progress Note  Case was staffed with Beryle Lathekonkwo NP who recommended patient be observed over night and re-evaluated in the a.m. This Clinical research associatewriter spoke with Hyacinth MeekerMiller MD at [facility] to staff case. Hyacinth MeekerMiller MD informs this writer of collateral information that patient did not report to include ongoing problems with patient's ex-husband and recent death of family members. Miller MD, reports that patient meets criteria and could benefit from a inpatient admission to assist with stabilization. Patient will be offered a inpatient admission to assist with current issues and crisis management. Per Hyacinth MeekerMiller MD patient will be IVCed if patient declines.
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
## 2017-06-02 NOTE — ED Notes (Signed)
[facility] and this RN was told to ask patient if she would stay and be re-evaluated in the morning.  Pt has agreed to this plan.  Pt is not IVC'd.
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
## 2017-06-02 NOTE — ED Notes (Signed)
Advised patient [facility] and [facility] receiving [facility]patient services.  Pt became tearful and stated that she needed to work in order to pay her bills and she could not do that while she's here.  This RN advised pt that there are resources that she would qualify for to receive assistance.  Pt states that she will cooperate and stay voluntarily.
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
## 2017-06-02 NOTE — ED Notes (Signed)
[employer] and [employer] at bedside.  Pt has been calm and cooperative throughout the process.

## 2017-06-03 ENCOUNTER — Encounter (HOSPITAL_COMMUNITY): Payer: Self-pay | Admitting: Psychiatry

## 2017-06-03 DIAGNOSIS — F17210 Nicotine dependence, cigarettes, uncomplicated: Secondary | ICD-10-CM

## 2017-06-03 DIAGNOSIS — Z813 Family history of other psychoactive substance abuse and dependence: Secondary | ICD-10-CM

## 2017-06-03 DIAGNOSIS — Z62810 Personal history of physical and sexual abuse in childhood: Secondary | ICD-10-CM

## 2017-06-03 DIAGNOSIS — Z62811 Personal history of psychological abuse in childhood: Secondary | ICD-10-CM

## 2017-06-03 DIAGNOSIS — F332 Major depressive disorder, recurrent severe without psychotic features: Secondary | ICD-10-CM | POA: Diagnosis present

## 2017-06-03 DIAGNOSIS — Z811 Family history of alcohol abuse and dependence: Secondary | ICD-10-CM

## 2017-06-03 DIAGNOSIS — F4310 Post-traumatic stress disorder, unspecified: Principal | ICD-10-CM

## 2017-06-03 DIAGNOSIS — R45851 Suicidal ideations: Secondary | ICD-10-CM

## 2017-06-03 DIAGNOSIS — F411 Generalized anxiety disorder: Secondary | ICD-10-CM

## 2017-06-03 LAB — LIPID PANEL
CHOLESTEROL: 158 mg/dL (ref 0–200)
HDL: 42 mg/dL (ref >40)
LDL CALC: 96 mg/dL (ref 0–99)
TRIGLYCERIDES: 98 mg/dL (ref <150)
Total CHOL/HDL Ratio: 3.8 RATIO
VLDL: 20 mg/dL (ref 0–40)

## 2017-06-03 LAB — TSH: TSH: 3.177 u[IU]/mL (ref 0.350–4.500)

## 2017-06-03 MED ORDER — FLUOXETINE HCL 20 MG PO CAPS
20.0000 mg | ORAL_CAPSULE | Freq: Every day | ORAL | Status: DC
  Administered 2017-06-03 – 2017-06-05 (×3): 20 mg via ORAL
  Filled 2017-06-03: qty 7
  Filled 2017-06-03 (×5): qty 1

## 2017-06-03 MED ORDER — NICOTINE POLACRILEX 2 MG MT GUM
2.0000 mg | CHEWING_GUM | OROMUCOSAL | Status: DC | PRN
  Administered 2017-06-04 (×4): 2 mg via ORAL
  Filled 2017-06-03: qty 10
  Filled 2017-06-03 (×4): qty 1

## 2017-06-03 MED ORDER — NICOTINE 21 MG/24HR TD PT24
21.0000 mg | MEDICATED_PATCH | Freq: Every day | TRANSDERMAL | Status: DC
  Administered 2017-06-03: 07:00:00 via TRANSDERMAL
  Filled 2017-06-03 (×3): qty 1

## 2017-06-03 MED ORDER — NICOTINE 21 MG/24HR TD PT24
MEDICATED_PATCH | TRANSDERMAL | Status: AC
  Filled 2017-06-03: qty 1
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
## 2017-06-03 NOTE — Progress Notes (Signed)
Patient attended group and said that her day was a 7.  Her coping skills were socializing, sleeping and watching tv.
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
## 2017-06-03 NOTE — Progress Notes (Signed)
Group: Meditation/Mindfulness Activity: Stretching exercises and meditation practice Notes:   Patient participated in the exercises.  She stated that they helped with her anxiety.  Provided low stimulation and calming music.  Patient were offered a blanket on the floor or their chair.  Patient appears to enjoy the activity and was engaged with staff and peers.  Vesta Mixeraroline B., RN 0900
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
## 2017-06-03 NOTE — Progress Notes (Signed)
D: Patient presents with flat, blunted affect; sad and depressed mood.  Patient rates her depression and anxiety as a 7; denies any hopelessness.  Patient is focused on discharge.  She denies any thoughts of self harm.  She is sleeping and eating well; her energy level is normal and her concentration if good.  She is attending groups and participating.  Patient complains mostly of anxiety today. A: Continue to monitor medication management and MD orders.  Safety checks completed every 15 minutes per protocol.  Offer support and encouragement as needed. R: Patient is receptive to staff; her behavior is appropriate.

## 2017-06-03 NOTE — [facility] Suicide Risk Assessment (Signed)
[facility] INPATIENT:  Family/Significant Other Suicide Prevention Education  Suicide Prevention Education:  Patient Refusal for Family/Significant Other Suicide Prevention Education: The patient Tracie Aguilar has refused to provide written consent for family/significant other to be provided Family/Significant Other Suicide Prevention Education during admission and/or prior to discharge.  Physician notified.  Carloyn JaegerMareida J Grossman-Orr 06/03/2017, 10:01 AM

## 2017-06-04 DIAGNOSIS — Z562 Threat of job loss: Secondary | ICD-10-CM

## 2017-06-04 DIAGNOSIS — F39 Unspecified mood [affective] disorder: Secondary | ICD-10-CM

## 2017-06-04 LAB — HEMOGLOBIN A1C
Hgb A1c MFr Bld: 5 % (ref 4.8–5.6)
MEAN PLASMA GLUCOSE: 97 mg/dL

## 2017-06-04 MED ORDER — HYDROXYZINE HCL 50 MG PO TABS
50.0000 mg | ORAL_TABLET | Freq: Three times a day (TID) | ORAL | Status: DC | PRN
  Administered 2017-06-04 – 2017-06-05 (×2): 50 mg via ORAL
  Filled 2017-06-04: qty 1
  Filled 2017-06-04: qty 10
  Filled 2017-06-04: qty 1

## 2017-06-04 NOTE — Progress Notes (Cosign Needed)
Adult Psychoeducational Group Note  Date:  06/04/2017 Time:  10:54 AM  Group Topic/Focus:  Wellness Toolbox:   The focus of this group is to discuss various aspects of wellness, balancing those aspects and exploring ways to increase the ability to experience wellness.  Patients will create a wellness toolbox for use upon discharge.  Participation Level:  Active  Participation Quality:  Appropriate  Affect:  Appropriate  Cognitive:  Alert  Insight: Appropriate, Good and Improving  Engagement in Group:  Engaged  Modes of Intervention:  Discussion  Additional Comments:  Pt did participate in all group activities and discussions today.  Daniyal Tabor R Lio Wehrly 06/04/2017, 10:54 AM
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
## 2017-06-04 NOTE — Progress Notes (Signed)
Tracie Aguilar. Tracie Aguilar had been up and visible in milieu this evening, spoke of her day and spoke about how she had been feeling better today. She spoke about how she is hopeful to leave relatively soon. She did endorse anxiety and did request and receive medications to help and also asked various questions in regards to anti-depressant medications. Tracie Aguilar was able to receive all bedtime medications without incident and did not verbalize any complaints of pain. A. Support provided, medication education given. Tracie Aguilar. Tracie Aguilar verbalized understanding, safety maintained.

## 2017-06-04 NOTE — Plan of Care (Signed)
Problem: Education: Goal: Utilization of techniques to improve thought processes will improve Outcome: Progressing Nurse discussed depression/anxiety/coping skills with patient.    

## 2017-06-04 NOTE — Progress Notes (Signed)
PATIENT SIGNED 72 HR REQUEST FOR DISCHARGE ON 06/04/2017 AT 1520.

## 2017-06-04 NOTE — Progress Notes (Signed)
Per pt request, CSW called pt's manager Eddie at [phone]. CSW stated that pt is in the hospital and will likely discharge tomorrow or Wednesday. Eddie expressed his understanding and requested that pt have a letter stating the dates she was in the hospital when she returns to work. CSW agreed.  Jonathon JordanLynn B Ladeana Laplant, MSW, Theresia MajorsLCSWA [phone]

## 2017-06-04 NOTE — Progress Notes (Signed)
D:  Patient's self inventory sheet, patient has fair sleep, sleep medication helpful.  Good appetite, normal energy level, good concentration.  Rated depression, hopeless #4, anxiety #8.  Denied withdrawals.  Denied SI.  Denied physical problems.  Denied physical pain.  Goal is going home.  Plans to discharge.  "I have a job and 3 kids.  My pastor has my children.   I need to go home to them.  I am going to be financially unable to pay my bills by being here.  I can't stress enough I don't have any help and this is making it worse.  I will take this medicine.  I need something else for anxiety.  I have not any changes in my anxiety/heart palp  I've been having for months.  I'm not really being helped here. I am better off home." A:  Medications administered per MD orders.  Emotional support and encouragement given patient. R:  Denied SI and HI, contracts for safety.  Denied A/V hallucinations.  Safety maintained with 15 minute checksl

## 2017-06-04 NOTE — [facility] Group Notes (Signed)
[facility] LCSW Group Therapy  06/04/2017 1:15pm  Type of Therapy: Group Therapy   Topic: Overcoming Obstacles  Participation Level: Pt invited. Did not attend.  Herron Fero B Ashana Tullo, MSW, LCSWA [phone]    

## 2017-06-04 NOTE — Tx Team (Signed)
Interdisciplinary Treatment and Diagnostic Plan Update 06/04/2017 Time of Session: 9:30am  Tracie SakeGloria D Knoebel  MRN: [id number]  Principal Diagnosis: PTSD (post-traumatic stress disorder)  Secondary Diagnoses: Principal Problem:   PTSD (post-traumatic stress disorder) Active Problems:   Major depressive disorder, recurrent severe without psychotic features (HCC)   Current Medications:  Current Facility-Administered Medications  Medication Dose Route Frequency Provider Last Rate Last Dose  . acetaminophen (TYLENOL) tablet 650 mg  650 mg Oral Q6H PRN Nira ConnBerry, Jason A, NP   650 mg at 06/02/17 2206  . alum & mag hydroxide-simeth (MAALOX/MYLANTA) 200-200-20 MG/5ML suspension 30 mL  30 mL Oral Q4H PRN Nira ConnBerry, Jason A, NP      . FLUoxetine (PROZAC) capsule 20 mg  20 mg Oral Daily Money, Gerlene Burdockravis B, FNP   20 mg at 06/04/17 [id number]  . hydrOXYzine (ATARAX/VISTARIL) tablet 25 mg  25 mg Oral TID PRN Jackelyn PolingBerry, Jason A, NP   25 mg at 06/04/17 0810  . magnesium hydroxide (MILK OF MAGNESIA) suspension 30 mL  30 mL Oral Daily PRN Nira ConnBerry, Jason A, NP      . nicotine polacrilex (NICORETTE) gum 2 mg  2 mg Oral PRN Cobos, Rockey SituFernando A, MD   2 mg at 06/04/17 0903  . traZODone (DESYREL) tablet 50 mg  50 mg Oral QHS PRN Jackelyn PolingBerry, Jason A, NP   50 mg at 06/03/17 2141    PTA Medications: Prescriptions Prior to Admission  Medication Sig Dispense Refill Last Dose  . Multiple Vitamins-Minerals (HAIR/SKIN/NAILS/BIOTIN PO) Take 1 tablet by mouth daily.   06/01/2017 at Unknown time    Treatment Modalities: Medication Management, Group therapy, Case management,  1 to 1 session with clinician, Psychoeducation, Recreational therapy.  Patient Stressors: Financial difficulties Marital or family conflict Occupational concerns Patient Strengths: Ability for insight Average or above average intelligence Capable of independent living General fund of knowledge  Physician Treatment Plan for Primary Diagnosis: PTSD (post-traumatic  stress disorder) Long Term Goal(s): Improvement in symptoms so as ready for discharge Short Term Goals: Ability to verbalize feelings will improve Ability to disclose and discuss suicidal ideas Ability to maintain clinical measurements within normal limits will improve  Medication Management: Evaluate patient's response, side effects, and tolerance of medication regimen.  Therapeutic Interventions: 1 to 1 sessions, Unit Group sessions and Medication administration.  Evaluation of Outcomes: Progressing  Physician Treatment Plan for Secondary Diagnosis: Principal Problem:   PTSD (post-traumatic stress disorder) Active Problems:   Major depressive disorder, recurrent severe without psychotic features (HCC)  Long Term Goal(s): Improvement in symptoms so as ready for discharge  Short Term Goals: Ability to verbalize feelings will improve Ability to disclose and discuss suicidal ideas Ability to maintain clinical measurements within normal limits will improve  Medication Management: Evaluate patient's response, side effects, and tolerance of medication regimen.  Therapeutic Interventions: 1 to 1 sessions, Unit Group sessions and Medication administration.  Evaluation of Outcomes: Progressing  RN Treatment Plan for Primary Diagnosis: PTSD (post-traumatic stress disorder) Long Term Goal(s): Knowledge of disease and therapeutic regimen to maintain health will improve  Short Term Goals: Ability to verbalize feelings will improve and Compliance with prescribed medications will improve  Medication Management: RN will administer medications as ordered by provider, will assess and evaluate patient's response and provide education to patient for prescribed medication. RN will report any adverse and/or side effects to prescribing provider.  Therapeutic Interventions: 1 on 1 counseling sessions, Psychoeducation, Medication administration, Evaluate responses to treatment, Monitor vital signs and  CBGs as ordered, Perform/monitor  CIWA, COWS, AIMS and Fall Risk screenings as ordered, Perform wound care treatments as ordered.  Evaluation of Outcomes: Progressing  LCSW Treatment Plan for Primary Diagnosis: PTSD (post-traumatic stress disorder) Long Term Goal(s): Safe transition to appropriate next level of care at discharge, Engage patient in therapeutic group addressing interpersonal concerns. Short Term Goals: Engage patient in aftercare planning with referrals and resources, Increase emotional regulation, Identify triggers associated with mental health/substance abuse issues and Increase skills for wellness and recovery  Therapeutic Interventions: Assess for all discharge needs, 1 to 1 time with Social worker, Explore available resources and support systems, Assess for adequacy in community support network, Educate family and significant other(s) on suicide prevention, Complete Psychosocial Assessment, Interpersonal group therapy.  Evaluation of Outcomes: Progressing  Progress in Treatment: Attending groups: Yes  Participating in groups: Yes Taking medication as prescribed: Yes, MD continues to assess for medication changes as needed Toleration medication: Yes, no side effects reported at this time Family/Significant other contact made: No, pt declined contact Patient understands diagnosis: Developing insight  Discussing patient identified problems/goals with staff: Yes Medical problems stabilized or resolved: Yes Denies suicidal/homicidal ideation:Yes Issues/concerns per patient self-inventory: None Other: N/A  New problem(s) identified: None identified at this time.   New Short Term/Long Term Goal(s): None identified at this time.   Discharge Plan or Barriers: Pt will return home and follow up with an outpatient provider.  Reason for Continuation of Hospitalization:  Anxiety  Depression Medication stabilization Suicidal ideation  Estimated Length of Stay: 1-3 days;  Estimated discharge date 06/07/17  Attendees: Patient: 06/04/2017 10:28 AM  Physician: Dr. Jama Flavors 06/04/2017 10:28 AM  Nursing: Meriam Sprague, RN 06/04/2017 10:28 AM  RN Care Manager: 06/04/2017 10:28 AM  Social Worker: Donnelly Stager, LCSWA 06/04/2017 10:28 AM  Recreational Therapist:  06/04/2017 10:28 AM  Other: Armandina Stammer, NP 06/04/2017 10:28 AM  Other:  06/04/2017 10:28 AM  Other: 06/04/2017 10:28 AM  Scribe for Treatment Team: Jonathon Jordan, MSW,LCSWA 06/04/2017 10:28 AM

## 2017-06-05 DIAGNOSIS — F419 Anxiety disorder, unspecified: Secondary | ICD-10-CM

## 2017-06-05 DIAGNOSIS — G4700 Insomnia, unspecified: Secondary | ICD-10-CM

## 2017-06-05 MED ORDER — TRAZODONE HCL 50 MG PO TABS: 50.0000 mg | ORAL_TABLET | Freq: Every evening | ORAL | 0 refills | Status: DC | PRN

## 2017-06-05 MED ORDER — HYDROXYZINE HCL 50 MG PO TABS: 50.0000 mg | ORAL_TABLET | Freq: Three times a day (TID) | ORAL | 0 refills | Status: DC | PRN

## 2017-06-05 MED ORDER — FLUOXETINE HCL 20 MG PO CAPS: 20.0000 mg | ORAL_CAPSULE | Freq: Every day | ORAL | 0 refills | Status: DC
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
## 2017-06-05 NOTE — Progress Notes (Signed)
  [facility] :  Will you be returning to the same living situation after discharge:  Yes,  pt returning home. At discharge, do you have transportation home?: Yes,  Juel [facility] will transport. Do you have the ability to pay for your medications: Yes,  prescriptions and samples provided.  Release of information consent forms completed and in the chart;  Patient's signature needed at discharge.  Patient to Follow up at: Follow-up Information    [facility] Follow up on 06/11/2017.   Why:  Hospital discharge appointment 8/20 at 8am. Afternoon appointments are not available for your intial visit. However, after this visit your subsequent appointments can be scheduled for the afternoon. Contact information: [address]  [place] [id number] [phone]           Next level of care provider [facility]:no  Safety Planning and Suicide Prevention discussed: Yes,  with pt.  Have you used any form of tobacco in the last 30 days? (Cigarettes, Smokeless Tobacco, Cigars, and/or Pipes): Yes  Has patient been referred to the Quitline?: Patient refused referral  Patient has been referred for addiction treatment: Yes  Jonathon JordanLynn B Caileb Rhue, MSW, LCSWA 06/05/2017, 1:24 PM
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
## 2017-06-05 NOTE — Progress Notes (Signed)
Tracie Aguilar. Tracie Aguilar had been up and visible in milieu this evening, did attend and participate in group therapy. She spoke about wanting to leave soon and spoke about trying to figure out how to deal with the situation that she's in and spoke about how she is hopeful the medications will be helpful. She did endorse anxiety this evening, and did request and receive medications without incident. Tracie Aguilar denied any SI and did not verbalize any complaints of pain. A. Support and encouragement provided. R. Safety maintained, will continue to monitor.

## 2017-06-05 NOTE — Progress Notes (Signed)
Discharge note:  Patient discharged home per MD orders.  Patient will follow up with [facility] upon discharge.  Reviewed AVS/transition record with patient and she indicated understanding.  Patient denies any thoughts of self harm.  Patient received all personal belongings from unit and locker. She left with prescriptions and medication samples.  Patient left ambulatory with her ride (pastor's wife).  Patient found out right before she left that her children is being taken into custody by DSS.  Patient states, "the police called them.  They told me if I tried to pick up the children and take them home, they would take them away from me."  Children are currently staying with her pastor and his wife.

## 2017-06-05 NOTE — Progress Notes (Signed)
Adult Psychoeducational Group Note  Date:  06/05/2017 Time:  1:05 AM  Group Topic/Focus:  Wrap-Up Group:   The focus of this group is to help patients review their daily goal of treatment and discuss progress on daily workbooks.  Participation Level:  Active  Participation Quality:  Appropriate  Affect:  Appropriate   Cognitive:  Alert  Insight: Appropriate  Engagement in Group:  Engaged  Modes of Intervention:  Discussion  Additional Comments:  Patient rated her day a 6. Patient's goal for today was to get back on medication.   Jeriah Corkum L Nazanin Kinner 06/05/2017, 1:05 AM

## 2017-08-29 ENCOUNTER — Emergency Department (HOSPITAL_COMMUNITY)
Admission: EM | Admit: 2017-08-29 | Discharge: 2017-08-29 | Disposition: A | Discharge status: Home / Self Care | Payer: BLUE CROSS/BLUE SHIELD | Attending: Emergency Medicine | Admitting: Emergency Medicine

## 2017-08-29 ENCOUNTER — Other Ambulatory Visit: Payer: Self-pay

## 2017-08-29 ENCOUNTER — Encounter (HOSPITAL_COMMUNITY): Payer: Self-pay | Admitting: Emergency Medicine

## 2017-08-29 DIAGNOSIS — N730 Acute parametritis and pelvic cellulitis: Secondary | ICD-10-CM | POA: Insufficient documentation

## 2017-08-29 DIAGNOSIS — F17210 Nicotine dependence, cigarettes, uncomplicated: Secondary | ICD-10-CM | POA: Insufficient documentation

## 2017-08-29 DIAGNOSIS — N898 Other specified noninflammatory disorders of vagina: Secondary | ICD-10-CM | POA: Insufficient documentation

## 2017-08-29 DIAGNOSIS — Z79899 Other long term (current) drug therapy: Secondary | ICD-10-CM | POA: Insufficient documentation

## 2017-08-29 DIAGNOSIS — R1030 Lower abdominal pain, unspecified: Secondary | ICD-10-CM | POA: Insufficient documentation

## 2017-08-29 DIAGNOSIS — R102 Pelvic and perineal pain: Secondary | ICD-10-CM | POA: Diagnosis present

## 2017-08-29 LAB — CBC
HEMATOCRIT: 40.5 % (ref 36.0–46.0)
Hemoglobin: 13.8 g/dL (ref 12.0–15.0)
MCH: 29.9 pg (ref 26.0–34.0)
MCHC: 34.1 g/dL (ref 30.0–36.0)
MCV: 87.9 fL (ref 78.0–100.0)
PLATELETS: 327 10*3/uL (ref 150–400)
RBC: 4.61 MIL/uL (ref 3.87–5.11)
RDW: 13 % (ref 11.5–15.5)
WBC: 9.7 10*3/uL (ref 4.0–10.5)

## 2017-08-29 LAB — COMPREHENSIVE METABOLIC PANEL
ALK PHOS: 73 U/L (ref 38–126)
ALT: 13 U/L — ABNORMAL LOW (ref 14–54)
ANION GAP: 8 (ref 5–15)
AST: 13 U/L — ABNORMAL LOW (ref 15–41)
Albumin: 3.9 g/dL (ref 3.5–5.0)
BILIRUBIN TOTAL: 0.5 mg/dL (ref 0.3–1.2)
BUN: 15 mg/dL (ref 6–20)
CHLORIDE: 102 mmol/L (ref 101–111)
CO2: 25 mmol/L (ref 22–32)
CREATININE: 0.57 mg/dL (ref 0.44–1.00)
Calcium: 9.2 mg/dL (ref 8.9–10.3)
GLUCOSE: 101 mg/dL — AB (ref 65–99)
Potassium: 3.5 mmol/L (ref 3.5–5.1)
SODIUM: 135 mmol/L (ref 135–145)
Total Protein: 7.2 g/dL (ref 6.5–8.1)

## 2017-08-29 LAB — URINALYSIS, ROUTINE W REFLEX MICROSCOPIC
BILIRUBIN URINE: NEGATIVE
Glucose, UA: NEGATIVE mg/dL
HGB URINE DIPSTICK: NEGATIVE
Ketones, ur: NEGATIVE mg/dL
Leukocytes, UA: NEGATIVE
Nitrite: NEGATIVE
Protein, ur: NEGATIVE mg/dL
SPECIFIC GRAVITY, URINE: 1.013 (ref 1.005–1.030)
pH: 6 (ref 5.0–8.0)

## 2017-08-29 LAB — WET PREP, GENITAL
CLUE CELLS WET PREP: NONE SEEN
Sperm: NONE SEEN
Trich, Wet Prep: NONE SEEN
Yeast Wet Prep HPF POC: NONE SEEN

## 2017-08-29 LAB — PREGNANCY, URINE: Preg Test, Ur: NEGATIVE

## 2017-08-29 MED ORDER — DOXYCYCLINE HYCLATE 100 MG PO CAPS: 100.0000 mg | ORAL_CAPSULE | Freq: Two times a day (BID) | ORAL | 0 refills | Status: DC

## 2017-08-29 MED ORDER — AZITHROMYCIN 250 MG PO TABS
1000.0000 mg | ORAL_TABLET | Freq: Once | ORAL | Status: AC
  Administered 2017-08-29: 1000 mg via ORAL
  Filled 2017-08-29: qty 4

## 2017-08-29 MED ORDER — LIDOCAINE HCL (PF) 1 % IJ SOLN
INTRAMUSCULAR | Status: AC
  Administered 2017-08-29: 0.9 mL
  Filled 2017-08-29: qty 2

## 2017-08-29 MED ORDER — METRONIDAZOLE 500 MG PO TABS: 500.0000 mg | ORAL_TABLET | Freq: Two times a day (BID) | ORAL | 0 refills | Status: DC

## 2017-08-29 MED ORDER — METRONIDAZOLE 500 MG PO TABS
500.0000 mg | ORAL_TABLET | Freq: Once | ORAL | Status: AC
  Administered 2017-08-29: 500 mg via ORAL
  Filled 2017-08-29: qty 1

## 2017-08-29 MED ORDER — CEFTRIAXONE SODIUM 250 MG IJ SOLR
250.0000 mg | Freq: Once | INTRAMUSCULAR | Status: AC
  Administered 2017-08-29: 250 mg via INTRAMUSCULAR
  Filled 2017-08-29: qty 250

## 2017-08-29 NOTE — ED Notes (Signed)
Pt states she self treated a week ago for what she thought was yeast infection. White/yellow discharge. Pt states pain has been constant for two weeks and reports unprotected sex.

## 2017-08-29 NOTE — ED Triage Notes (Signed)
Pt c/o "pelvic pain" x 2 weeks. Pt points to lower LLQ area. Denies radiation. Clear vaginal dc. Denies gu sx.

## 2017-08-29 NOTE — ED Notes (Signed)
Pelvic Cart to bedside 

## 2017-08-29 NOTE — ED Notes (Signed)
Pt unable to wait for shot time, she said she has to leave to get her kids from school.  EDP aware.

## 2017-08-29 NOTE — ED Provider Notes (Signed)
[facility] EMERGENCY DEPARTMENT Provider Note   CSN: [id number] Arrival date & time: 08/29/17  0946     History   Chief Complaint Chief Complaint  Patient presents with  . Abdominal Pain    HPI Tracie Aguilar is a 31 y.o. female.  Patient with a complaint of lower quadrant pelvic pain for 2 weeks.  No nausea no vomiting no fever.  No dysuria.  Patient was some concern of STD but does not have an elevated concern.  Talks about her vaginal discharge.  White yellow in nature.  She was treated a week ago for yeast infection.  But things did not get better.      Past Medical History:  Diagnosis Date  . Anxiety   . Depression   . Kidney stones 2007   passed x2  . Post traumatic stress disorder    prior medications     Patient Active Problem List   Diagnosis Date Noted  . PTSD (post-traumatic stress disorder) 06/03/2017  . 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 Strep pharyngitis 12/21/2015  . Abdominal pain 07/21/2015  . Dizziness 07/21/2015  . Depression 07/21/2015  . Encounter for smoking cessation counseling 07/21/2015  . Vaginal discharge 07/21/2015  . Health care maintenance 07/21/2015    Past Surgical History:  Procedure Laterality Date  . TUBAL LIGATION  2013    OB History    Gravida Para Term Preterm AB Living   5 3           SAB TAB Ectopic Multiple Live Births                   Home Medications    Prior to Admission medications   Medication Sig Start Date End Date Taking? Authorizing Provider  acetaminophen (TYLENOL) 500 MG tablet Take 500 mg every 6 (six) hours as needed by mouth.   Yes [provider]  FLUoxetine (PROZAC) 20 MG capsule Take 1 capsule (20 mg total) by mouth daily. For depression 06/06/17  Yes Armandina Stammer I, NP  doxycycline (VIBRAMYCIN) 100 MG capsule Take 1 capsule (100 mg total) 2 (two) times daily by mouth. 08/29/17   Vanetta Mulders, MD  hydrOXYzine  (ATARAX/VISTARIL) 50 MG tablet Take 1 tablet (50 mg total) by mouth 3 (three) times daily as needed for anxiety. 06/05/17   Armandina Stammer I, NP  metroNIDAZOLE (FLAGYL) 500 MG tablet Take 1 tablet (500 mg total) 2 (two) times daily by mouth. 08/29/17   Vanetta Mulders, MD  traZODone (DESYREL) 50 MG tablet Take 1 tablet (50 mg total) by mouth at bedtime as needed for sleep. 06/05/17   Sanjuana Kava, NP    Family History Family History  Problem Relation Age of Onset  . COPD Mother   . Alcohol abuse Mother   . Arthritis Mother        OA  . Hypertension Mother   . Alcohol abuse Father   . Hypertension Father   . Diabetes Father   . Lung cancer Maternal Grandmother   . Stroke Maternal Grandfather   . Hypertension Maternal Grandfather   . Diabetes Maternal Grandfather   . Stroke Paternal Grandfather   . Breast cancer Cousin   . Diabetes Cousin     Social History Social History   Tobacco Use  . Smoking status: Current Every Day Smoker    Packs/day: 1.00    Years: 15.00    Pack years: 15.00    Types: Cigarettes  Start date: 10/23/2001  . Smokeless tobacco: Never Used  Substance Use Topics  . Alcohol use: Yes    Alcohol/week: 0.0 oz    Comment: occassinal  . Drug use: No     Allergies   Sulfa antibiotics   Review of Systems Review of Systems  Constitutional: Negative for fever.  HENT: Negative for congestion.   Eyes: Negative for redness and visual disturbance.  Respiratory: Negative for shortness of breath.   Cardiovascular: Negative for chest pain.  Gastrointestinal: Positive for abdominal pain. Negative for nausea and vomiting.  Genitourinary: Positive for pelvic pain and vaginal discharge. Negative for dysuria.  Musculoskeletal: Negative for back pain.  Neurological: Negative for headaches.  Hematological: Does not bruise/bleed easily.  Psychiatric/Behavioral: Negative for confusion.     Physical Exam Updated Vital Signs BP (!) 102/59 (BP Location: Right Arm)    Pulse 72 Comment: Simultaneous filing. User may not have seen previous data.  Temp 98.2 F (36.8 C) (Oral)   Resp 20   Ht 1.651 m (5\' 5" )   Wt 88 kg (194 lb)   LMP 08/22/2017   SpO2 97% Comment: Simultaneous filing. User may not have seen previous data.  BMI 32.28 kg/m   Physical Exam  Constitutional: She is oriented to person, place, and time. She appears well-developed and well-nourished.  HENT:  Head: Normocephalic and atraumatic.  Eyes: EOM are normal. Pupils are equal, round, and reactive to light.  Cardiovascular: Normal rate, regular rhythm and normal heart sounds.  Pulmonary/Chest: Effort normal and breath sounds normal.  Abdominal: Normal appearance and bowel sounds are normal. There is tenderness in the right lower quadrant and left lower quadrant. There is no rebound and no guarding.  Genitourinary: Uterus is tender. Cervix exhibits motion tenderness and discharge. Right adnexum displays tenderness. Left adnexum displays no tenderness. There is tenderness in the vagina. Vaginal discharge found.  Genitourinary Comments: External genitalia is normal.  Whitish yellowish discharge noted.  Positive cervical motion tenderness positive uterine tenderness.  No adnexal tenderness.  No bleeding.  Neurological: She is alert and oriented to person, place, and time.  Skin: No rash noted.  Nursing note and vitals reviewed.    ED Treatments / Results  Labs (all labs ordered are listed, but only abnormal results are displayed) Labs Reviewed  WET PREP, GENITAL - Abnormal; Notable for the following components:      Result Value   WBC, Wet Prep HPF POC MODERATE (*)    All other components within normal limits  COMPREHENSIVE METABOLIC PANEL - Abnormal; Notable for the following components:   Glucose, Bld 101 (*)    AST 13 (*)    ALT 13 (*)    All other components within normal limits  PREGNANCY, URINE  URINALYSIS, ROUTINE W REFLEX MICROSCOPIC  CBC  RPR  HIV ANTIBODY (ROUTINE  TESTING)  GC/CHLAMYDIA PROBE AMP ([place]) NOT AT [facility]    EKG  EKG Interpretation None       Radiology No results found.  Procedures Procedures (including critical care time)  Medications Ordered in ED Medications  cefTRIAXone (ROCEPHIN) injection 250 mg (250 mg Intramuscular Given 08/29/17 1419)  azithromycin (ZITHROMAX) tablet 1,000 mg (1,000 mg Oral Given 08/29/17 1419)  metroNIDAZOLE (FLAGYL) tablet 500 mg (500 mg Oral Given 08/29/17 1419)  lidocaine (PF) (XYLOCAINE) 1 % injection (0.9 mLs  Given 08/29/17 1420)     Initial Impression / Assessment and Plan / ED Course  I have reviewed the triage vital signs and the nursing notes.  Pertinent labs & imaging results that were available during my care of the patient were reviewed by me and considered in my medical decision making (see chart for details).     Clinically symptoms consistent with PID.  Wet prep without evidence of clue cells yeast or trichomonas.  Will treat as if PID.  Patient received Rocephin here Flagyl here.  And a dose of Zithromax.  Will be continued on doxycycline and Flagyl for the next 2 weeks.  Given referral to OB/GYN for follow-up.  Cultures are pending.  Final Clinical Impressions(s) / ED Diagnoses   Final diagnoses:  PID (acute pelvic inflammatory disease)    ED Discharge Orders        Ordered    metroNIDAZOLE (FLAGYL) 500 MG tablet  2 times daily     08/29/17 1421    doxycycline (VIBRAMYCIN) 100 MG capsule  2 times daily     08/29/17 1421       Vanetta MuldersZackowski, Kobie Matkins, MD 08/29/17 [id number]

## 2017-08-29 NOTE — Discharge Instructions (Signed)
Take antibiotics as directed.  Would expect improvement over the next couple days.  Return for any new or worse symptoms.  Work note provided.  He will take the doxycycline for 2 weeks.  He will take the Flagyl for 2 weeks.  Wet prep was back and did not show anything other than lots of white blood cells which still keeps is concerned about pelvic inflammatory disease.

## 2017-08-30 LAB — GC/CHLAMYDIA PROBE AMP (~~LOC~~) NOT AT ARMC
Chlamydia: NEGATIVE
Neisseria Gonorrhea: NEGATIVE

## 2017-08-30 LAB — HIV ANTIBODY (ROUTINE TESTING W REFLEX): HIV SCREEN 4TH GENERATION: NONREACTIVE

## 2017-08-30 LAB — RPR: RPR: NONREACTIVE

## 2017-11-27 ENCOUNTER — Encounter: Payer: Self-pay | Admitting: Family Medicine

## 2017-11-27 ENCOUNTER — Ambulatory Visit: Payer: BLUE CROSS/BLUE SHIELD | Admitting: Family Medicine

## 2017-11-27 VITALS — BP 106/69 | HR 83 | Temp 98.7°F | Ht 65.0 in | Wt 208.4 lb

## 2017-11-27 DIAGNOSIS — S4991XA Unspecified injury of right shoulder and upper arm, initial encounter: Secondary | ICD-10-CM

## 2017-11-27 MED ORDER — TRAMADOL HCL 50 MG PO TABS: 50.0000 mg | ORAL_TABLET | Freq: Two times a day (BID) | ORAL | 0 refills | Status: DC | PRN

## 2017-11-27 MED ORDER — PREDNISONE 20 MG PO TABS: ORAL_TABLET | ORAL | 0 refills | Status: DC

## 2017-11-27 MED ORDER — METHYLPREDNISOLONE ACETATE 80 MG/ML IJ SUSP
80.0000 mg | Freq: Once | INTRAMUSCULAR | Status: AC
  Administered 2017-11-27: 80 mg via INTRAMUSCULAR

## 2017-11-27 NOTE — Progress Notes (Signed)
Tracie Aguilar , Apr 24, 1986, 32 y.o., female MRN: [id number] Patient Care Team    Relationship Specialty Notifications Start End  Natalia Leatherwood, DO PCP - General Family Medicine  11/27/17     Chief Complaint  Patient presents with  . Arm Pain    pt c/o of right arm pain rated 8/10 that started yesterday. try Icy-patch no relief.     Subjective: Pt presents for an OV with complaints of right arm and shoulder pain of 1 day duration.  Associated symptoms include pain and racing her arm above her shoulder, pain when laying on her arm and any movement. She denies any prior history of arm/shoulder pain, injury or surgery. She reports she was helping her friend move her washing machine the day prior. She has been taking approximately 500 mg of naproxen twice a day for 2 days. She has been using icy hot patches. She rates the pain 8/10 with movement.   No flowsheet data found.  Allergies  Allergen Reactions  . Sulfa Antibiotics Hives   Social History   Tobacco Use  . Smoking status: Current Every Day Smoker    Packs/day: 1.00    Years: 15.00    Pack years: 15.00    Types: Cigarettes    Start date: 10/23/2001  . Smokeless tobacco: Never Used  Substance Use Topics  . Alcohol use: Yes    Alcohol/week: 0.0 oz    Comment: occassinal   Past Medical History:  Diagnosis Date  . Anxiety   . Depression   . Kidney stones 2007   passed x2  . Post traumatic stress disorder    prior medications    Past Surgical History:  Procedure Laterality Date  . TUBAL LIGATION  2013   Family History  Problem Relation Age of Onset  . COPD Mother   . Alcohol abuse Mother   . Arthritis Mother        OA  . Hypertension Mother   . Alcohol abuse Father   . Hypertension Father   . Diabetes Father   . Lung cancer Maternal Grandmother   . Stroke Maternal Grandfather   . Hypertension Maternal Grandfather   . Diabetes Maternal Grandfather   . Stroke Paternal Grandfather   . Breast cancer  Cousin   . Diabetes Cousin    Allergies as of 11/27/2017      Reactions   Sulfa Antibiotics Hives      Medication List        Accurate as of 11/27/17  3:01 PM. Always use your most recent med list.          FLUoxetine 20 MG capsule Commonly known as:  PROZAC Take 1 capsule (20 mg total) by mouth daily. For depression       All past medical history, surgical history, allergies, family history, immunizations andmedications were updated in the EMR today and reviewed under the history and medication portions of their EMR.     ROS: Negative, with the exception of above mentioned in HPI   Objective:  BP 106/69 (BP Location: Left Arm, Patient Position: Sitting, Cuff Size: Normal)   Pulse 83   Temp 98.7 F (37.1 C) (Oral)   Ht 5\' 5"  (1.651 m)   Wt 208 lb 6.4 oz (94.5 kg)   LMP 11/11/2017   SpO2 98%   BMI 34.68 kg/m  Body mass index is 34.68 kg/m. Gen: Afebrile. No acute distress. Nontoxic in appearance, well developed, well nourished.  HENT: AT. [place].MMM  MSK: No erythema, no soft tissue swelling. Tender to palpation deltoid. No tenderness bicipital groove. Discomfort even at 90 abduction. Positive Hawkins, positive empty can test. Neurovascularly intact distally. Neuro:  Normal gait. PERLA. EOMi. Alert. Oriented x3  No exam data present No results found. No results found for this or any previous visit (from the past 24 hour(s)).  Assessment/Plan: Tracie Aguilar is a 32 y.o. female present for OV for  Injury of right shoulder, initial encounter - Patient is very uncomfortable during exam. Concern for rotator cuff injury given her discomfort and decreased ROM.  - Tramadol prescribed for pain, short-term. Steroid injection provided today. Prednisone taper to start tomorrow. - Ambulatory referral to Orthopedic Surgery - methylPREDNISolone acetate (DEPO-MEDROL) injection 80 mg - Follow-up when necessary   Reviewed expectations re: course of current medical  issues.  Discussed self-management of symptoms.  Outlined signs and symptoms indicating need for more acute intervention.  Patient verbalized understanding and all questions were answered.  Patient received an After-Visit Summary.    No orders of the defined types were placed in this encounter.    Note is dictated utilizing voice recognition software. Although note has been proof read prior to signing, occasional typographical errors still can be missed. If any questions arise, please do not hesitate to call for verification.   electronically signed by:  Felix Pacinienee Kuneff, DO  [facility] [place]

## 2017-11-27 NOTE — Patient Instructions (Signed)
Start tramadol tonight. May cause sedation. You can continue naproxen with food.  Start prednisone tomorrow (injection today).   Referral to orthopedics placed they will call you to schedule.     Rotator Cuff Injury Rotator cuff injury is any type of injury to the set of muscles and tendons that make up the stabilizing unit of your shoulder. This unit holds the ball of your upper arm bone (humerus) in the socket of your shoulder blade (scapula). What are the causes? Injuries to your rotator cuff most commonly come from sports or activities that cause your arm to be moved repeatedly over your head. Examples of this include throwing, weight lifting, swimming, or racquet sports. Long lasting (chronic) irritation of your rotator cuff can cause soreness and swelling (inflammation), bursitis, and eventual damage to your tendons, such as a tear (rupture). What are the signs or symptoms? Acute rotator cuff tear:  Sudden tearing sensation followed by severe pain shooting from your upper shoulder down your arm toward your elbow.  Decreased range of motion of your shoulder because of pain and muscle spasm.  Severe pain.  Inability to raise your arm out to the side because of pain and loss of muscle power (large tears).  Chronic rotator cuff tear:  Pain that usually is worse at night and may interfere with sleep.  Gradual weakness and decreased shoulder motion as the pain worsens.  Decreased range of motion.  Rotator cuff tendinitis:  Deep ache in your shoulder and the outside upper arm over your shoulder.  Pain that comes on gradually and becomes worse when lifting your arm to the side or turning it inward.  How is this diagnosed? Rotator cuff injury is diagnosed through a medical history, physical exam, and imaging exam. The medical history helps determine the type of rotator cuff injury. Your health care provider will look at your injured shoulder, feel the injured area, and ask you to  move your shoulder in different positions. X-ray exams typically are done to rule out other causes of shoulder pain, such as fractures. MRI is the exam of choice for the most severe shoulder injuries because the images show muscles and tendons. How is this treated? Chronic tear:  Medicine for pain, such as acetaminophen or ibuprofen.  Physical therapy and range-of-motion exercises may be helpful in maintaining shoulder function and strength.  Steroid injections into your shoulder joint.  Surgical repair of the rotator cuff if the injury does not heal with noninvasive treatment.  Acute tear:  Anti-inflammatory medicines such as ibuprofen and naproxen to help reduce pain and swelling.  A sling to help support your arm and rest your rotator cuff muscles. Long-term use of a sling is not advised. It may cause significant stiffening of the shoulder joint.  Surgery may be considered within a few weeks, especially in younger, active people, to return the shoulder to full function.  Indications for surgical treatment include the following: ? Age younger than 60 years. ? Rotator cuff tears that are complete. ? Physical therapy, rest, and anti-inflammatory medicines have been used for 6-8 weeks, with no improvement. ? Employment or sporting activity that requires constant shoulder use.  Tendinitis:  Anti-inflammatory medicines such as ibuprofen and naproxen to help reduce pain and swelling.  A sling to help support your arm and rest your rotator cuff muscles. Long-term use of a sling is not advised. It may cause significant stiffening of the shoulder joint.  Severe tendinitis may require: ? Steroid injections into your shoulder joint. ?  Physical therapy. ? Surgery.  Follow these instructions at home:  Apply ice to your injury: ? Put ice in a plastic bag. ? Place a towel between your skin and the bag. ? Leave the ice on for 20 minutes, 2-3 times a day.  If you have a shoulder  immobilizer (sling and straps), wear it until told otherwise by your health care provider.  You may want to sleep on several pillows or in a recliner at night to lessen swelling and pain.  Only take over-the-counter or prescription medicines for pain, discomfort, or fever as directed by your health care provider.  Do simple hand squeezing exercises with a soft rubber ball to decrease hand swelling. Contact a health care provider if:  Your shoulder pain increases, or new pain or numbness develops in your arm, hand, or fingers.  Your hand or fingers are colder than your other hand. Get help right away if:  Your arm, hand, or fingers are numb or tingling.  Your arm, hand, or fingers are increasingly swollen and painful, or they turn white or blue. This information is not intended to replace advice given to you by your health care provider. Make sure you discuss any questions you have with your health care provider. Document Released: 10/06/2000 Document Revised: 03/16/2016 Document Reviewed: 05/21/2013 Elsevier Interactive Patient Education  2018 [employer]

## 2017-11-29 ENCOUNTER — Ambulatory Visit (INDEPENDENT_AMBULATORY_CARE_PROVIDER_SITE_OTHER): Payer: BLUE CROSS/BLUE SHIELD

## 2017-11-29 ENCOUNTER — Ambulatory Visit: Payer: BLUE CROSS/BLUE SHIELD | Admitting: Orthopaedic Surgery

## 2017-11-29 ENCOUNTER — Encounter: Payer: Self-pay | Admitting: Orthopaedic Surgery

## 2017-11-29 VITALS — BP 107/50 | HR 90 | Temp 98.6°F | Ht 65.0 in | Wt 208.0 lb

## 2017-11-29 DIAGNOSIS — M25511 Pain in right shoulder: Secondary | ICD-10-CM | POA: Diagnosis not present
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
## 2017-11-29 NOTE — Patient Instructions (Signed)
Steps to Quit Smoking Smoking tobacco can be bad for your health. It can also affect almost every organ in your body. Smoking puts you and people around you at risk for many serious long-lasting (chronic) diseases. Quitting smoking is hard, but it is one of the best things that you can do for your health. It is never too late to quit. What are the benefits of quitting smoking? When you quit smoking, you lower your risk for getting serious diseases and conditions. They can include:  Lung cancer or lung disease.  Heart disease.  Stroke.  Heart attack.  Not being able to have children (infertility).  Weak bones (osteoporosis) and broken bones (fractures).  If you have coughing, wheezing, and shortness of breath, those symptoms may get better when you quit. You may also get sick less often. If you are pregnant, quitting smoking can help to lower your chances of having a baby of low birth weight. What can I do to help me quit smoking? Talk with your doctor about what can help you quit smoking. Some things you can do (strategies) include:  Quitting smoking totally, instead of slowly cutting back how much you smoke over a period of time.  Going to in-person counseling. You are more likely to quit if you go to many counseling sessions.  Using resources and support systems, such as: ? Online chats with a counselor. ? Phone quitlines. ? Printed self-help materials. ? Support groups or group counseling. ? Text messaging programs. ? Mobile phone apps or applications.  Taking medicines. Some of these medicines may have nicotine in them. If you are pregnant or breastfeeding, do not take any medicines to quit smoking unless your doctor says it is okay. Talk with your doctor about counseling or other things that can help you.  Talk with your doctor about using more than one strategy at the same time, such as taking medicines while you are also going to in-person counseling. This can help make  quitting easier. What things can I do to make it easier to quit? Quitting smoking might feel very hard at first, but there is a lot that you can do to make it easier. Take these steps:  Talk to your family and friends. Ask them to support and encourage you.  Call phone quitlines, reach out to support groups, or work with a counselor.  Ask people who smoke to not smoke around you.  Avoid places that make you want (trigger) to smoke, such as: ? Bars. ? Parties. ? Smoke-break areas at work.  Spend time with people who do not smoke.  Lower the stress in your life. Stress can make you want to smoke. Try these things to help your stress: ? Getting regular exercise. ? Deep-breathing exercises. ? Yoga. ? Meditating. ? Doing a body scan. To do this, close your eyes, focus on one area of your body at a time from head to toe, and notice which parts of your body are tense. Try to relax the muscles in those areas.  Download or buy apps on your mobile phone or tablet that can help you stick to your quit plan. There are many free apps, such as QuitGuide from the CDC (Centers for Disease Control and Prevention). You can find more support from smokefree.gov and other websites.  This information is not intended to replace advice given to you by your health care provider. Make sure you discuss any questions you have with your health care provider. Document Released: 08/05/2009 Document   Revised: 06/06/2016 Document Reviewed: 02/23/2015 Elsevier Interactive Patient Education  2018 Elsevier Inc.  

## 2017-12-05 ENCOUNTER — Ambulatory Visit: Payer: BLUE CROSS/BLUE SHIELD | Admitting: Orthopaedic Surgery

## 2017-12-05 ENCOUNTER — Encounter: Payer: Self-pay | Admitting: Orthopaedic Surgery

## 2017-12-05 ENCOUNTER — Telehealth: Payer: Self-pay | Admitting: Family Medicine

## 2017-12-05 VITALS — BP 119/64 | HR 78 | Temp 98.3°F | Ht 65.0 in | Wt 206.0 lb

## 2017-12-05 DIAGNOSIS — M25511 Pain in right shoulder: Secondary | ICD-10-CM

## 2017-12-05 NOTE — Telephone Encounter (Signed)
Spoke with patient reviewed information offered to schedule her an appt she declined at this time. She states she is going to call the Ortho Dr back.

## 2017-12-05 NOTE — Patient Instructions (Addendum)
..Your MRI has been ordered.  We will contact your insurance company for approval. After the authorization is received the MRI and a return appointment will be scheduled with you by phone.  If you do not hear from us within 5 business days please call [phone] and ask for the pre-authorization representative in our office.      Steps to Quit Smoking Smoking tobacco can be bad for your health. It can also affect almost every organ in your body. Smoking puts you and people around you at risk for many serious long-lasting (chronic) diseases. Quitting smoking is hard, but it is one of the best things that you can do for your health. It is never too late to quit. What are the benefits of quitting smoking? When you quit smoking, you lower your risk for getting serious diseases and conditions. They can include:  Lung cancer or lung disease.  Heart disease.  Stroke.  Heart attack.  Not being able to have children (infertility).  Weak bones (osteoporosis) and broken bones (fractures).  If you have coughing, wheezing, and shortness of breath, those symptoms may get better when you quit. You may also get sick less often. If you are pregnant, quitting smoking can help to lower your chances of having a baby of low birth weight. What can I do to help me quit smoking? Talk with your doctor about what can help you quit smoking. Some things you can do (strategies) include:  Quitting smoking totally, instead of slowly cutting back how much you smoke over a period of time.  Going to in-person counseling. You are more likely to quit if you go to many counseling sessions.  Using resources and support systems, such as: ? Online chats with a counselor. ? Phone quitlines. ? Printed self-help materials. ? Support groups or group counseling. ? Text messaging programs. ? Mobile phone apps or applications.  Taking medicines. Some of these medicines may have nicotine in them. If you are pregnant or  breastfeeding, do not take any medicines to quit smoking unless your doctor says it is okay. Talk with your doctor about counseling or other things that can help you.  Talk with your doctor about using more than one strategy at the same time, such as taking medicines while you are also going to in-person counseling. This can help make quitting easier. What things can I do to make it easier to quit? Quitting smoking might feel very hard at first, but there is a lot that you can do to make it easier. Take these steps:  Talk to your family and friends. Ask them to support and encourage you.  Call phone quitlines, reach out to support groups, or work with a counselor.  Ask people who smoke to not smoke around you.  Avoid places that make you want (trigger) to smoke, such as: ? Bars. ? Parties. ? Smoke-break areas at work.  Spend time with people who do not smoke.  Lower the stress in your life. Stress can make you want to smoke. Try these things to help your stress: ? Getting regular exercise. ? Deep-breathing exercises. ? Yoga. ? Meditating. ? Doing a body scan. To do this, close your eyes, focus on one area of your body at a time from head to toe, and notice which parts of your body are tense. Try to relax the muscles in those areas.  Download or buy apps on your mobile phone or tablet that can help you stick to your quit plan.   your quit plan. There are many free apps, such as QuitGuide from the [employer] Office manager for Disease Control and Prevention). You can find more support from smokefree.gov and other websites.  This information is not intended to replace advice given to you by your health care provider. Make sure you discuss any questions you have with your health care provider. Document Released: 08/05/2009 Document Revised: 06/06/2016 Document Reviewed: 02/23/2015 Elsevier Interactive Patient Education  2018 [employer].

## 2017-12-05 NOTE — Telephone Encounter (Signed)
Copied from CRM [phone]#[id number]. Topic: General - Other >> Dec 05, 2017 10:45 AM Darletta MollLander, Lumin L wrote: Reason for CRM: [facility] traMADol (ULTRAM) 50 MG tablet on 11/27/2017 but the patient says it makes her nauseous and she would like something else for her shoulder pain. Please call patient back to discuss. She uses [id number] - [place], [place] - [address] [facility] [address] [place] [id number] Phone: [phone] Fax: [phone].

## 2017-12-05 NOTE — Telephone Encounter (Signed)
Please return pts call. If she is not tolerating the tramadol she will need to be seen in order to provide/prescribe medication. These are controlled substances and a face to face encounter is needed by law to prescribe.  She does not have a pain contract with us. She was provided short term pain control with tramadol for her shoulder pain. It is not against the law for her orthopedic doctor to provide her pain control on condition they are now managing, as long as she is in the office (face to face encounter). Maybe she misunderstood?  It is also possible it is the steroid and naproxen that is upsetting her stomach if still using. Try starting prilosec while on those meds.  Either way, we can help her, but she would need to be seen in order to do so.

## 2017-12-05 NOTE — Telephone Encounter (Signed)
Spoke with patient she has been referred to sports med and they have seen her twice for her shoulder pain patient states that they told her that they couldn't Rx anything else since we Rx'd tramadol for her and she would have to get something else from us . I read note from sports med and they told her to continue current med and they are ordering MRI. Explained to patient she is now under their care for her shoulder pain and we could not Rx any pain medication for her. She states she can't take Tramadol it makes her nauseous.

## 2017-12-05 NOTE — Progress Notes (Signed)
Patient Tracie Aguilar:Tracie Aguilar, female DOB:08/29/1986, 32 y.o. NWG:[id number]  Chief Complaint  Patient presents with  . Shoulder Pain    right     HPI  Tracie Aguilar is a 32 y.o. female who has worsening pain of the right shoulder.  The injection last time did not help.  She has much less motion than before. She has no numbness.  She is very emotional today when I told her she had to wait six weeks from first visit for the shoulder by any provider to get a MRI.  She feels something is wrong in the shoulder and that something else needs to be done.  I will request a MRI of the right shoulder.  She is indeed worse and in more pain.  She has less motion.  Return in one week.  She has pain medicine.  I did provide and fit a sling today.  Stay out of work or at least very modified duty with no use of the right arm. HPI  Body mass index is 34.28 kg/m.  ROS  Review of Systems  Musculoskeletal: Positive for arthralgias and joint swelling.  Psychiatric/Behavioral: The patient is nervous/anxious.   All other systems reviewed and are negative.   Past Medical History:  Diagnosis Date  . Anxiety   . Depression   . Kidney stones 2007   passed x2  . Post traumatic stress disorder    prior medications     Past Surgical History:  Procedure Laterality Date  . TUBAL LIGATION  2013    Family History  Problem Relation Age of Onset  . COPD Mother   . Alcohol abuse Mother   . Arthritis Mother        OA  . Hypertension Mother   . Alcohol abuse Father   . Hypertension Father   . Diabetes Father   . Lung cancer Maternal Grandmother   . Stroke Maternal Grandfather   . Hypertension Maternal Grandfather   . Diabetes Maternal Grandfather   . Stroke Paternal Grandfather   . Breast cancer Cousin   . Diabetes Cousin     Social History Social History   Tobacco Use  . Smoking status: Current Every Day Smoker    Packs/day: 1.00    Years: 15.00    Pack years: 15.00   Types: Cigarettes    Start date: 10/23/2001  . Smokeless tobacco: Never Used  Substance Use Topics  . Alcohol use: Yes    Alcohol/week: 0.0 oz    Comment: occassinal  . Drug use: No    Allergies  Allergen Reactions  . Sulfa Antibiotics Hives    Current Outpatient Medications  Medication Sig Dispense Refill  . predniSONE (DELTASONE) 20 MG tablet 60 mg x3d, 40 mg x3d, 20 mg x2d, 10 mg x2d 18 tablet 0  . traMADol (ULTRAM) 50 MG tablet Take 1 tablet (50 mg total) by mouth every 12 (twelve) hours as needed. 30 tablet 0  . FLUoxetine (PROZAC) 20 MG capsule Take 1 capsule (20 mg total) by mouth daily. For depression (Patient not taking: Reported on 12/05/2017) 30 capsule 0   No current facility-administered medications for this visit.      Physical Exam  Blood pressure 119/64, pulse 78, temperature 98.3 F (36.8 C), height 5\' 5"  (1.651 m), weight 206 lb (93.4 kg), last menstrual period 11/11/2017.  Constitutional: overall normal hygiene, normal nutrition, well developed, normal grooming, normal body habitus. Assistive device:none  Musculoskeletal: gait and station Limp none, muscle tone and strength  are normal, no tremors or atrophy is present.  .  Neurological: coordination overall normal.  Deep tendon reflex/nerve stretch intact.  Sensation normal.  Cranial nerves II-XII intact.   Skin:   Normal overall no scars, lesions, ulcers or rashes. No psoriasis.  Psychiatric: Alert and oriented x 3.  Recent memory intact, remote memory unclear.  Normal mood and affect. Well groomed.  Good eye contact.  Cardiovascular: overall no swelling, no varicosities, no edema bilaterally, normal temperatures of the legs and arms, no clubbing, cyanosis and good capillary refill.  Lymphatic: palpation is normal.  Examination of right Upper Extremity is done.  Inspection:   Overall:  Elbow non-tender without crepitus or defects, forearm non-tender without crepitus or defects, wrist non-tender without  crepitus or defects, hand non-tender.    Shoulder: with glenohumeral joint tenderness, without effusion.   Upper arm: without swelling and tenderness   Range of motion:   Overall:  Full range of motion of the elbow, full range of motion of wrist and full range of motion in fingers.   Shoulder:  right  35 degrees forward flexion; 30 degrees abduction; 10 degrees internal rotation, 10 degrees external rotation, 0 degrees extension, 20 degrees adduction.  She has limited and painful arc of motion. She has positive drop arm sign.   Stability:   Overall:  Shoulder, elbow and wrist stable   Strength and Tone:   Overall full shoulder muscles strength, full upper arm strength and normal upper arm bulk and tone.  All other systems reviewed and are negative   The patient has been educated about the nature of the problem(s) and counseled on treatment options.  The patient appeared to understand what I have discussed and is in agreement with it.  Encounter Diagnosis  Name Primary?  . Acute pain of right shoulder Yes    PLAN Call if any problems.  Precautions discussed.  Continue current medications. I am concerned about a rotator cuff tear.  Return to clinic MRI right shoulder   Return in one week if MRI denied.  Consider appeal of the MRI if denied.  Electronically Signed Darreld Mclean, MD 2/13/201910:49 AM

## 2017-12-05 NOTE — Telephone Encounter (Signed)
See not below. Not a patient of [facility].

## 2017-12-06 ENCOUNTER — Encounter: Payer: Self-pay | Admitting: Obstetrics and Gynecology

## 2017-12-12 ENCOUNTER — Ambulatory Visit: Payer: Self-pay | Admitting: Orthopaedic Surgery

## 2017-12-16 ENCOUNTER — Ambulatory Visit
Admission: RE | Admit: 2017-12-16 | Discharge: 2017-12-16 | Disposition: A | Discharge status: Home / Self Care | Payer: BLUE CROSS/BLUE SHIELD | Source: Ambulatory Visit | Attending: Orthopaedic Surgery | Admitting: Orthopaedic Surgery

## 2017-12-16 DIAGNOSIS — M25511 Pain in right shoulder: Secondary | ICD-10-CM

## 2017-12-17 ENCOUNTER — Telehealth: Payer: Self-pay | Admitting: Orthopaedic Surgery

## 2017-12-17 NOTE — Telephone Encounter (Signed)
Tracie Aguilar called this morning stating that she went for her MRI this weekend.  She said that she wanted Tracie Aguilar to call her with the results instead of coming into the office on Wednesday afternoon.  I told her that we didn't have the results yet.  I also told her that it was up to you as to whether or not she would need to come in or if someone could call her with the results.  Please advise which you would rather have happen  Thanks

## 2017-12-17 NOTE — Telephone Encounter (Signed)
You can read her the report and if she would like, she can come by and get the report or get it from MyChart.  She will need surgery most likely.  I do not do surgery anymore and we can refer her to the physician of her choice.  I can see her in the office, of course, if she would like and explain things but I will need to see her to do this.  Thanks.

## 2017-12-19 ENCOUNTER — Encounter: Payer: Self-pay | Admitting: Orthopaedic Surgery

## 2017-12-19 ENCOUNTER — Ambulatory Visit: Payer: BLUE CROSS/BLUE SHIELD | Admitting: Orthopaedic Surgery

## 2017-12-19 VITALS — BP 108/73 | HR 71 | Temp 98.4°F | Ht 63.0 in | Wt 207.0 lb

## 2017-12-19 DIAGNOSIS — M25511 Pain in right shoulder: Secondary | ICD-10-CM | POA: Diagnosis not present

## 2017-12-19 MED ORDER — HYDROCODONE-ACETAMINOPHEN 5-325 MG PO TABS: ORAL_TABLET | ORAL | 0 refills | Status: DC

## 2017-12-19 NOTE — Patient Instructions (Signed)
Steps to Quit Smoking Smoking tobacco can be bad for your health. It can also affect almost every organ in your body. Smoking puts you and people around you at risk for many serious long-lasting (chronic) diseases. Quitting smoking is hard, but it is one of the best things that you can do for your health. It is never too late to quit. What are the benefits of quitting smoking? When you quit smoking, you lower your risk for getting serious diseases and conditions. They can include:  Lung cancer or lung disease.  Heart disease.  Stroke.  Heart attack.  Not being able to have children (infertility).  Weak bones (osteoporosis) and broken bones (fractures).  If you have coughing, wheezing, and shortness of breath, those symptoms may get better when you quit. You may also get sick less often. If you are pregnant, quitting smoking can help to lower your chances of having a baby of low birth weight. What can I do to help me quit smoking? Talk with your doctor about what can help you quit smoking. Some things you can do (strategies) include:  Quitting smoking totally, instead of slowly cutting back how much you smoke over a period of time.  Going to in-person counseling. You are more likely to quit if you go to many counseling sessions.  Using resources and support systems, such as: ? Online chats with a counselor. ? Phone quitlines. ? Printed self-help materials. ? Support groups or group counseling. ? Text messaging programs. ? Mobile phone apps or applications.  Taking medicines. Some of these medicines may have nicotine in them. If you are pregnant or breastfeeding, do not take any medicines to quit smoking unless your doctor says it is okay. Talk with your doctor about counseling or other things that can help you.  Talk with your doctor about using more than one strategy at the same time, such as taking medicines while you are also going to in-person counseling. This can help make  quitting easier. What things can I do to make it easier to quit? Quitting smoking might feel very hard at first, but there is a lot that you can do to make it easier. Take these steps:  Talk to your family and friends. Ask them to support and encourage you.  Call phone quitlines, reach out to support groups, or work with a counselor.  Ask people who smoke to not smoke around you.  Avoid places that make you want (trigger) to smoke, such as: ? Bars. ? Parties. ? Smoke-break areas at work.  Spend time with people who do not smoke.  Lower the stress in your life. Stress can make you want to smoke. Try these things to help your stress: ? Getting regular exercise. ? Deep-breathing exercises. ? Yoga. ? Meditating. ? Doing a body scan. To do this, close your eyes, focus on one area of your body at a time from head to toe, and notice which parts of your body are tense. Try to relax the muscles in those areas.  Download or buy apps on your mobile phone or tablet that can help you stick to your quit plan. There are many free apps, such as QuitGuide from the CDC (Centers for Disease Control and Prevention). You can find more support from smokefree.gov and other websites.  This information is not intended to replace advice given to you by your health care provider. Make sure you discuss any questions you have with your health care provider. Document Released: 08/05/2009 Document   Revised: 06/06/2016 Document Reviewed: 02/23/2015 Elsevier Interactive Patient Education  2018 Elsevier Inc.  

## 2017-12-19 NOTE — Progress Notes (Signed)
Patient ZO:[id number] Tracie Aguilar, female DOB:[birth year], 32 y.o. VWU:[id number]  Chief Complaint  Patient presents with  . Shoulder Pain    right-MRI review    HPI  Tracie Aguilar is a 32 y.o. female who has pain in the right shoulder.  She is not improving.  She had a MRI done and it shows: IMPRESSION: 1. Labral irregularity compatible with tear in the posterosuperior labrum and posterior-inferior labrum, and likely in the anterior inferior labrum as well. 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 2. Moderate supraspinatus and infraspinatus tendinopathy with mild subscapularis and biceps tendinopathy. There is some fissuring in the supraspinatus tendon with possible mild extension to the bursal surface, but no full-thickness rotator cuff tear is identified. 3. Mild subacromial subdeltoid bursitis and small glenohumeral joint effusion. 4. Low-level focal subcortical marrow edema anteriorly within the Glenoid.  I have explained the findings to her.  I have given her a copy of the report.  I have suggested she see Dr. Romeo Apple for possible surgery.  She has more pain with raising her right shoulder.  She has no new trauma, no numbness. HPI  Body mass index is 36.67 kg/m.  ROS  Review of Systems  Musculoskeletal: Positive for arthralgias and joint swelling.  Psychiatric/Behavioral: The patient is nervous/anxious.   All other systems reviewed and are negative.   Past Medical History:  Diagnosis Date  . Anxiety   . Depression   . Kidney stones 2007   passed x2  . Post traumatic stress disorder    prior medications     Past Surgical History:  Procedure Laterality Date  . TUBAL LIGATION  2013    Family History  Problem Relation Age of Onset  . COPD Mother   . Alcohol abuse Mother   . Arthritis Mother        OA  . Hypertension Mother   . Alcohol abuse Father   . Hypertension Father   . Diabetes Father   .  Lung cancer Maternal Grandmother   . Stroke Maternal Grandfather   . Hypertension Maternal Grandfather   . Diabetes Maternal Grandfather   . Stroke Paternal Grandfather   . Breast cancer Cousin   . Diabetes Cousin     Social History Social History   Tobacco Use  . Smoking status: Current Every Day Smoker    Packs/day: 1.00    Years: 15.00    Pack years: 15.00    Types: Cigarettes    Start date: 10/23/2001  . Smokeless tobacco: Never Used  Substance Use Topics  . Alcohol use: Yes    Alcohol/week: 0.0 oz    Comment: occassinal  . Drug use: No    Allergies  Allergen Reactions  . Sulfa Antibiotics Hives    Current Outpatient Medications  Medication Sig Dispense Refill  . FLUoxetine (PROZAC) 20 MG capsule Take 1 capsule (20 mg total) by mouth daily. For depression (Patient not taking: Reported on 12/05/2017) 30 capsule 0  . HYDROcodone-acetaminophen (NORCO/VICODIN) 5-325 MG tablet One tablet by mouth every six hours as needed for pain.  Seven day limit 28 tablet 0  . predniSONE (DELTASONE) 20 MG tablet 60 mg x3d, 40 mg x3d, 20 mg x2d, 10 mg x2d 18 tablet 0  . traMADol (ULTRAM) 50 MG tablet Take 1 tablet (50 mg total) by mouth every 12 (twelve) hours as needed. 30 tablet 0   No current facility-administered medications for this visit.  Physical Exam  Blood pressure 108/73, pulse 71, temperature 98.4 F (36.9 C), height 5\' 3"  (1.6 m), weight 207 lb (93.9 kg).  Constitutional: overall normal hygiene, normal nutrition, well developed, normal grooming, normal body habitus. Assistive device:none  Musculoskeletal: gait and station Limp none, muscle tone and strength are normal, no tremors or atrophy is present.  .  Neurological: coordination overall normal.  Deep tendon reflex/nerve stretch intact.  Sensation normal.  Cranial nerves II-XII intact.   Skin:   Normal overall no scars, lesions, ulcers or rashes. No psoriasis.  Psychiatric: Alert and oriented x 3.  Recent  memory intact, remote memory unclear.  Normal mood and affect. Well groomed.  Good eye contact.  Cardiovascular: overall no swelling, no varicosities, no edema bilaterally, normal temperatures of the legs and arms, no clubbing, cyanosis and good capillary refill.  Lymphatic: palpation is normal.  Examination of right Upper Extremity is done.  Inspection:   Overall:  Elbow non-tender without crepitus or defects, forearm non-tender without crepitus or defects, wrist non-tender without crepitus or defects, hand non-tender.    Shoulder: with glenohumeral joint tenderness, without effusion.   Upper arm: without swelling and tenderness   Range of motion:   Overall:  Full range of motion of the elbow, full range of motion of wrist and full range of motion in fingers.   Shoulder:  right  150 degrees forward flexion; 120 degrees abduction; 30 degrees internal rotation, 30 degrees external rotation, 10 degrees extension, 35 degrees adduction.   Stability:   Overall:  Shoulder, elbow and wrist stable   Strength and Tone:   Overall full shoulder muscles strength, full upper arm strength and normal upper arm bulk and tone.  All other systems reviewed and are negative   The patient has been educated about the nature of the problem(s) and counseled on treatment options.  The patient appeared to understand what I have discussed and is in agreement with it.  Encounter Diagnosis  Name Primary?  . Acute pain of right shoulder Yes    PLAN Call if any problems.  Precautions discussed.  Continue current medications.   Return to clinic to see Dr. Romeo AppleHarrison   Electronically Signed Darreld McleanWayne Eura Radabaugh, MD 2/27/20194:52 PM

## 2018-01-16 ENCOUNTER — Ambulatory Visit: Payer: Self-pay | Admitting: Orthopedic Surgery

## 2018-02-11 ENCOUNTER — Telehealth: Payer: Self-pay | Admitting: Orthopedic Surgery

## 2018-02-11 ENCOUNTER — Encounter: Payer: Self-pay | Admitting: Orthopedic Surgery

## 2018-02-11 NOTE — Telephone Encounter (Signed)
Called to follow up with patient regarding the re-schedule of appointment with Dr Romeo AppleHarrison which she elected to cancel 01/16/18.  No answer; left voice message to return call.  Letter also has been sent.

## 2018-04-01 ENCOUNTER — Other Ambulatory Visit: Payer: Self-pay | Admitting: Obstetrics and Gynecology

## 2018-04-08 ENCOUNTER — Other Ambulatory Visit (HOSPITAL_COMMUNITY)
Admission: RE | Admit: 2018-04-08 | Discharge: 2018-04-08 | Disposition: A | Discharge status: Home / Self Care | Payer: BLUE CROSS/BLUE SHIELD | Source: Ambulatory Visit | Attending: Obstetrics and Gynecology | Admitting: Obstetrics and Gynecology

## 2018-04-08 ENCOUNTER — Ambulatory Visit (INDEPENDENT_AMBULATORY_CARE_PROVIDER_SITE_OTHER): Payer: Self-pay | Admitting: Obstetrics and Gynecology

## 2018-04-08 ENCOUNTER — Encounter: Payer: Self-pay | Admitting: Obstetrics and Gynecology

## 2018-04-08 VITALS — BP 105/66 | HR 92 | Ht 62.0 in | Wt 211.8 lb

## 2018-04-08 DIAGNOSIS — Z01419 Encounter for gynecological examination (general) (routine) without abnormal findings: Secondary | ICD-10-CM

## 2018-04-08 DIAGNOSIS — R1030 Lower abdominal pain, unspecified: Secondary | ICD-10-CM

## 2018-04-08 DIAGNOSIS — N946 Dysmenorrhea, unspecified: Secondary | ICD-10-CM

## 2018-04-08 DIAGNOSIS — Z01411 Encounter for gynecological examination (general) (routine) with abnormal findings: Secondary | ICD-10-CM

## 2018-04-08 NOTE — Progress Notes (Addendum)
Patient ID: Tracie Aguilar, female   DOB: Jan 18, 1986, 32 y.o.   MRN: [id number]   Assessment:  Annual Gyn Exam Lower abdominal pain x 6 months dysmenorrrhea Plan:  1. pap smear done, next pap due 3 years 2. return 1 wk for u/s  4    F/u for transvaginal U/S in 2 weeks 5    F/u for patch birth control after u/s reviewed. Subjective:  Tracie Aguilar is a 32 y.o. female G5P3 who presents for annual exam. No LMP recorded. Per patient LMP 03/25/2018 The patient has complaints  of for the past 6 months she has had stomach pain and it radiates own her legs. When she is on her periods she has constant vomiting. She has had a period that last 2 weeks but her last period lasted 10 days but over the last year her periods have been getting longer. Her most severe pain is the day before her period and the first day of her period. At the end of her period she notes constant stomach pain. She denies any fever and chills, but has constant discharge with no odor after her period. She had the patch for birth control and had normal periods. She took birth control shot but made her bleed for 3 months. Patient does not have insurance She notes that pain in her right side all day yesterday. She had tubal clamps put in 6 years ago. She has had the same sexual part "for a while now". She has 3 children and started having them at 71 and breast fed her children   The following portions of the patient's history were reviewed and updated as appropriate: allergies, current medications, past family history, past medical history, past social history, past surgical history and problem list. Past Medical History:  Diagnosis Date   Anxiety    Depression    Kidney stones 2007   passed x2   Post traumatic stress disorder    prior medications     Past Surgical History:  Procedure Laterality Date   TUBAL LIGATION  2013     Current Outpatient Medications:    FLUoxetine (PROZAC) 20 MG capsule, Take 1 capsule  (20 mg total) by mouth daily. For depression (Patient not taking: Reported on 12/05/2017), Disp: 30 capsule, Rfl: 0   HYDROcodone-acetaminophen (NORCO/VICODIN) 5-325 MG tablet, One tablet by mouth every six hours as needed for pain.  Seven day limit, Disp: 28 tablet, Rfl: 0   predniSONE (DELTASONE) 20 MG tablet, 60 mg x3d, 40 mg x3d, 20 mg x2d, 10 mg x2d, Disp: 18 tablet, Rfl: 0   traMADol (ULTRAM) 50 MG tablet, Take 1 tablet (50 mg total) by mouth every 12 (twelve) hours as needed., Disp: 30 tablet, Rfl: 0  Review of Systems Constitutional: negative Gastrointestinal: negative Genitourinary: normal  Objective:  There were no vitals taken for this visit.   BMI: There is no height or weight on file to calculate BMI.  General Appearance: Alert, appropriate appearance for age. No acute distress HEENT: Grossly normal Neck / Thyroid: normal Cardiovascular: RRR; normal S1, S2, no murmur Lungs: CTA bilaterally Back: No CVAT Breast Exam: No dimpling, nipple retraction or discharge. No masses or nodes., Normal to inspection, Normal breast tissue bilaterally and No masses or nodes.No dimpling, nipple retraction or discharge. Gastrointestinal: Soft, non-tender, no masses or organomegaly, firm abdominal wall Pelvic exam:  VULVA: normal appearing vulva with no masses, tenderness or lesions VAGINA: normal appearing vagina with normal color and discharge, no lesions, vaginal  length is good, strong muscle tone CERVIX: normal appearing cervix without discharge or lesions, no inflimmation or infection UTERUS:tiny but otherwise normal, minimal tenderness in right side of uterus PAP: Pap smear done today. Lymphatic Exam: Non-palpable nodes in neck, clavicular, axillary, or inguinal regions Skin: no rash or abnormalities Neurologic: Normal gait and speech, no tremor  Psychiatric: Alert and oriented, appropriate affect.  Urinalysis:normal  By signing my name below, I, Arnette NorrisMari Johnson, attest that this  documentation has been prepared under the direction and in the presence of Tilda BurrowFerguson, John V, MD. Electronically Signed: Arnette NorrisMari Johnson Medical Scribe. 04/08/18. 11:10 AM.  I personally performed the services described in this documentation, which was SCRIBED in my presence. The recorded information has been reviewed and considered accurate. It has been edited as necessary during review. Tilda BurrowJohn V Ferguson, MD

## 2018-04-09 LAB — CYTOLOGY - PAP
Adequacy: ABSENT
Chlamydia: NEGATIVE
DIAGNOSIS: NEGATIVE
HPV (WINDOPATH): NOT DETECTED
Neisseria Gonorrhea: NEGATIVE

## 2018-04-10 ENCOUNTER — Telehealth: Payer: Self-pay | Admitting: *Deleted

## 2018-04-10 NOTE — Telephone Encounter (Signed)
Called patient. States she is unable to talk and will call back later.

## 2018-05-14 NOTE — Congregational Nurse Program (Signed)
Congregational Nurse Program Note  Date of Encounter: 05/14/2018  Past Medical History: Past Medical History:  Diagnosis Date  . Anxiety   . Depression   . Kidney stones 2007   passed x2  . Post traumatic stress disorder    prior medications     Encounter Details: CNP Questionnaire - 05/01/18 0940      Questionnaire   Patient Status  Not Applicable    Race  White or Caucasian    Location Patient Served At  [employer]    Insurance  Not Applicable    Uninsured  Uninsured (NEW 1x/quarter)    Food  No food insecurities    Housing/Utilities  Yes, have permanent housing    Transportation  No transportation needs    Interpersonal Safety  Yes, feel physically and emotionally safe where you currently live    Medication  No medication insecurities    Medical Provider  No    Referrals  [employer], [employer]    ED Visit Averted  Not Applicable    Life-Saving Intervention Made  Not Applicable      Seen at the food pantry.Infomation given about  [facility]. Stated she will be going within the next few weeks [address] Parthiv Mucci RN, [facility] [phone]

## 2018-08-15 LAB — GLUCOSE, POCT (MANUAL RESULT ENTRY): POC Glucose: 95 mg/dl (ref 70–99)

## 2018-11-06 ENCOUNTER — Encounter (HOSPITAL_COMMUNITY): Payer: Self-pay | Admitting: Emergency Medicine

## 2018-11-06 ENCOUNTER — Other Ambulatory Visit: Payer: Self-pay

## 2018-11-06 ENCOUNTER — Emergency Department (HOSPITAL_COMMUNITY)
Admission: EM | Admit: 2018-11-06 | Discharge: 2018-11-06 | Disposition: A | Discharge status: Home / Self Care | Payer: 59 | Attending: Emergency Medicine | Admitting: Emergency Medicine

## 2018-11-06 DIAGNOSIS — Z202 Contact with and (suspected) exposure to infections with a predominantly sexual mode of transmission: Secondary | ICD-10-CM | POA: Diagnosis not present

## 2018-11-06 DIAGNOSIS — R1030 Lower abdominal pain, unspecified: Secondary | ICD-10-CM

## 2018-11-06 DIAGNOSIS — F17210 Nicotine dependence, cigarettes, uncomplicated: Secondary | ICD-10-CM | POA: Insufficient documentation

## 2018-11-06 DIAGNOSIS — Z711 Person with feared health complaint in whom no diagnosis is made: Secondary | ICD-10-CM

## 2018-11-06 LAB — COMPREHENSIVE METABOLIC PANEL
ALT: 14 U/L (ref 0–44)
AST: 16 U/L (ref 15–41)
Albumin: 4.1 g/dL (ref 3.5–5.0)
Alkaline Phosphatase: 69 U/L (ref 38–126)
Anion gap: 7 (ref 5–15)
BUN: 15 mg/dL (ref 6–20)
CO2: 25 mmol/L (ref 22–32)
Calcium: 9.1 mg/dL (ref 8.9–10.3)
Chloride: 102 mmol/L (ref 98–111)
Creatinine, Ser: 0.55 mg/dL (ref 0.44–1.00)
GFR calc Af Amer: >60 mL/min (ref >60)
GFR calc non Af Amer: >60 mL/min (ref >60)
Glucose, Bld: 97 mg/dL (ref 70–99)
Potassium: 3.8 mmol/L (ref 3.5–5.1)
Sodium: 134 mmol/L — ABNORMAL LOW (ref 135–145)
Total Bilirubin: 0.6 mg/dL (ref 0.3–1.2)
Total Protein: 7.5 g/dL (ref 6.5–8.1)

## 2018-11-06 LAB — URINALYSIS, ROUTINE W REFLEX MICROSCOPIC
Bilirubin Urine: NEGATIVE
Glucose, UA: NEGATIVE mg/dL
Hgb urine dipstick: NEGATIVE
Ketones, ur: NEGATIVE mg/dL
Nitrite: NEGATIVE
Protein, ur: NEGATIVE mg/dL
Specific Gravity, Urine: 1.023 (ref 1.005–1.030)
pH: 7 (ref 5.0–8.0)

## 2018-11-06 LAB — RAPID HIV SCREEN (HIV 1/2 AB+AG)
HIV 1/2 Antibodies: NONREACTIVE
HIV-1 P24 Antigen - HIV24: NONREACTIVE

## 2018-11-06 LAB — WET PREP, GENITAL
Clue Cells Wet Prep HPF POC: NONE SEEN
Sperm: NONE SEEN
Trich, Wet Prep: NONE SEEN
Yeast Wet Prep HPF POC: NONE SEEN

## 2018-11-06 LAB — CBC
HCT: 44.5 % (ref 36.0–46.0)
Hemoglobin: 14.2 g/dL (ref 12.0–15.0)
MCH: 28.7 pg (ref 26.0–34.0)
MCHC: 31.9 g/dL (ref 30.0–36.0)
MCV: 89.9 fL (ref 80.0–100.0)
Platelets: 345 10*3/uL (ref 150–400)
RBC: 4.95 MIL/uL (ref 3.87–5.11)
RDW: 14.1 % (ref 11.5–15.5)
WBC: 11.4 10*3/uL — ABNORMAL HIGH (ref 4.0–10.5)
nRBC: 0 % (ref 0.0–0.2)

## 2018-11-06 LAB — LIPASE, BLOOD: Lipase: 24 U/L (ref 11–51)

## 2018-11-06 MED ORDER — CEFTRIAXONE SODIUM 250 MG IJ SOLR
250.0000 mg | Freq: Once | INTRAMUSCULAR | Status: AC
  Administered 2018-11-06: 250 mg via INTRAMUSCULAR
  Filled 2018-11-06: qty 250

## 2018-11-06 MED ORDER — LIDOCAINE HCL (PF) 1 % IJ SOLN
INTRAMUSCULAR | Status: AC
  Administered 2018-11-06: 2 mL
  Filled 2018-11-06: qty 2

## 2018-11-06 MED ORDER — AZITHROMYCIN 250 MG PO TABS
1000.0000 mg | ORAL_TABLET | Freq: Once | ORAL | Status: AC
  Administered 2018-11-06: 1000 mg via ORAL
  Filled 2018-11-06: qty 4

## 2018-11-06 MED ORDER — SODIUM CHLORIDE 0.9% FLUSH: 3.0000 mL | Freq: Once | INTRAVENOUS | Status: DC
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
## 2018-11-07 LAB — GC/CHLAMYDIA PROBE AMP (~~LOC~~) NOT AT ARMC
Chlamydia: NEGATIVE
Neisseria Gonorrhea: NEGATIVE

## 2018-11-07 LAB — URINE CULTURE: Culture: NO GROWTH

## 2019-09-11 ENCOUNTER — Ambulatory Visit: Payer: 59 | Admitting: Family Medicine

## 2019-10-07 ENCOUNTER — Other Ambulatory Visit: Payer: Self-pay

## 2019-10-07 ENCOUNTER — Ambulatory Visit (INDEPENDENT_AMBULATORY_CARE_PROVIDER_SITE_OTHER): Payer: Self-pay | Admitting: Obstetrics & Gynecology

## 2019-10-07 ENCOUNTER — Encounter: Payer: Self-pay | Admitting: Obstetrics & Gynecology

## 2019-10-07 VITALS — BP 111/76 | HR 102 | Ht 63.0 in | Wt 197.0 lb

## 2019-10-07 DIAGNOSIS — L738 Other specified follicular disorders: Secondary | ICD-10-CM

## 2019-10-07 MED ORDER — DOXYCYCLINE HYCLATE 100 MG PO TABS: 100.0000 mg | ORAL_TABLET | Freq: Two times a day (BID) | ORAL | 0 refills | Status: DC

## 2019-10-07 MED ORDER — SILVER SULFADIAZINE 1 % EX CREA: TOPICAL_CREAM | CUTANEOUS | 11 refills | Status: DC
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
## 2019-10-28 ENCOUNTER — Encounter: Payer: Self-pay | Admitting: Obstetrics & Gynecology

## 2019-10-28 ENCOUNTER — Other Ambulatory Visit: Payer: Self-pay

## 2019-10-28 ENCOUNTER — Ambulatory Visit (INDEPENDENT_AMBULATORY_CARE_PROVIDER_SITE_OTHER): Payer: Self-pay | Admitting: Obstetrics & Gynecology

## 2019-10-28 VITALS — BP 104/64 | HR 96 | Ht 63.0 in | Wt 202.0 lb

## 2019-10-28 DIAGNOSIS — L738 Other specified follicular disorders: Secondary | ICD-10-CM
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
## 2019-11-18 ENCOUNTER — Other Ambulatory Visit: Payer: Self-pay

## 2019-11-18 ENCOUNTER — Ambulatory Visit: Payer: Self-pay | Admitting: Physician Assistant

## 2019-11-18 ENCOUNTER — Ambulatory Visit: Payer: Medicaid Other | Attending: Internal Medicine

## 2019-11-18 DIAGNOSIS — Z20822 Contact with and (suspected) exposure to covid-19: Secondary | ICD-10-CM

## 2019-11-19 LAB — NOVEL CORONAVIRUS, NAA: SARS-CoV-2, NAA: NOT DETECTED

## 2019-12-01 ENCOUNTER — Other Ambulatory Visit: Payer: 59 | Admitting: Adult Health

## 2019-12-10 ENCOUNTER — Other Ambulatory Visit: Payer: Self-pay

## 2019-12-10 ENCOUNTER — Encounter: Payer: Self-pay | Admitting: Physician Assistant

## 2019-12-10 ENCOUNTER — Ambulatory Visit: Payer: Medicaid Other | Admitting: Physician Assistant

## 2019-12-10 VITALS — BP 100/64 | HR 95 | Temp 98.1°F | Wt 205.2 lb

## 2019-12-10 DIAGNOSIS — Z13220 Encounter for screening for lipoid disorders: Secondary | ICD-10-CM

## 2019-12-10 DIAGNOSIS — F419 Anxiety disorder, unspecified: Secondary | ICD-10-CM

## 2019-12-10 DIAGNOSIS — Z7689 Persons encountering health services in other specified circumstances: Secondary | ICD-10-CM

## 2019-12-10 DIAGNOSIS — Z131 Encounter for screening for diabetes mellitus: Secondary | ICD-10-CM

## 2019-12-10 DIAGNOSIS — R1084 Generalized abdominal pain: Secondary | ICD-10-CM

## 2019-12-10 DIAGNOSIS — R109 Unspecified abdominal pain: Secondary | ICD-10-CM

## 2019-12-10 DIAGNOSIS — Z833 Family history of diabetes mellitus: Secondary | ICD-10-CM

## 2019-12-10 MED ORDER — ALBUTEROL SULFATE HFA 108 (90 BASE) MCG/ACT IN AERS: 2.0000 | INHALATION_SPRAY | Freq: Four times a day (QID) | RESPIRATORY_TRACT | 1 refills | Status: DC | PRN

## 2019-12-10 NOTE — Patient Instructions (Signed)
Coping with Quitting Smoking  Quitting smoking is a physical and mental challenge. You will face cravings, withdrawal symptoms, and temptation. Before quitting, work with your health care provider to make a plan that can help you cope. Preparation can help you quit and keep you from giving in. How can I cope with cravings? Cravings usually last for 5-10 minutes. If you get through it, the craving will pass. Consider taking the following actions to help you cope with cravings:  Keep your mouth busy: ? Chew sugar-free gum. ? Suck on hard candies or a straw. ? Brush your teeth.  Keep your hands and body busy: ? Immediately change to a different activity when you feel a craving. ? Squeeze or play with a ball. ? Do an activity or a hobby, like making bead jewelry, practicing needlepoint, or working with wood. ? Mix up your normal routine. ? Take a short exercise break. Go for a quick walk or run up and down stairs. ? Spend time in public places where smoking is not allowed.  Focus on doing something kind or helpful for someone else.  Call a friend or family member to talk during a craving.  Join a support group.  Call a quit line, such as 1-800-QUIT-NOW.  Talk with your health care provider about medicines that might help you cope with cravings and make quitting easier for you. How can I deal with withdrawal symptoms? Your body may experience negative effects as it tries to get used to not having nicotine in the system. These effects are called withdrawal symptoms. They may include:  Feeling hungrier than normal.  Trouble concentrating.  Irritability.  Trouble sleeping.  Feeling depressed.  Restlessness and agitation.  Craving a cigarette. To manage withdrawal symptoms:  Avoid places, people, and activities that trigger your cravings.  Remember why you want to quit.  Get plenty of sleep.  Avoid coffee and other caffeinated drinks. These may worsen some of your  symptoms. How can I handle social situations? Social situations can be difficult when you are quitting smoking, especially in the first few weeks. To manage this, you can:  Avoid parties, bars, and other social situations where people might be smoking.  Avoid alcohol.  Leave right away if you have the urge to smoke.  Explain to your family and friends that you are quitting smoking. Ask for understanding and support.  Plan activities with friends or family where smoking is not an option. What are some ways I can cope with stress? Wanting to smoke may cause stress, and stress can make you want to smoke. Find ways to manage your stress. Relaxation techniques can help. For example:  Breathe slowly and deeply, in through your nose and out through your mouth.  Listen to soothing, relaxing music.  Talk with a family member or friend about your stress.  Light a candle.  Soak in a bath or take a shower.  Think about a peaceful place. What are some ways I can prevent weight gain? Be aware that many people gain weight after they quit smoking. However, not everyone does. To keep from gaining weight, have a plan in place before you quit and stick to the plan after you quit. Your plan should include:  Having healthy snacks. When you have a craving, it may help to: ? Eat plain popcorn, crunchy carrots, celery, or other cut vegetables. ? Chew sugar-free gum.  Changing how you eat: ? Eat small portion sizes at meals. ? Eat 4-6 small meals   throughout the day instead of 1-2 large meals a day. ? Be mindful when you eat. Do not watch television or do other things that might distract you as you eat.  Exercising regularly: ? Make time to exercise each day. If you do not have time for a long workout, do short bouts of exercise for 5-10 minutes several times a day. ? Do some form of strengthening exercise, like weight lifting, and some form of aerobic exercise, like running or swimming.  Drinking  plenty of water or other low-calorie or no-calorie drinks. Drink 6-8 glasses of water daily, or as much as instructed by your health care provider. Summary  Quitting smoking is a physical and mental challenge. You will face cravings, withdrawal symptoms, and temptation to smoke again. Preparation can help you as you go through these challenges.  You can cope with cravings by keeping your mouth busy (such as by chewing gum), keeping your body and hands busy, and making calls to family, friends, or a helpline for people who want to quit smoking.  You can cope with withdrawal symptoms by avoiding places where people smoke, avoiding drinks with caffeine, and getting plenty of rest.  Ask your health care provider about the different ways to prevent weight gain, avoid stress, and handle social situations. This information is not intended to replace advice given to you by your health care provider. Make sure you discuss any questions you have with your health care provider. Document Revised: 09/21/2017 Document Reviewed: 10/06/2016 Elsevier Patient Education  2020 Elsevier Inc.  

## 2019-12-10 NOTE — Progress Notes (Signed)
BP 100/64   Pulse 95   Temp 98.1 F (36.7 C)   Wt 205 lb 3.2 oz (93.1 kg)   SpO2 97%   BMI 36.35 kg/m    Subjective:    Patient ID: Tracie Aguilar, female    DOB: Mar 21, 1986, 34 y.o.   MRN: [id number]  HPI: Tracie Aguilar is a 34 y.o. female presenting on 12/10/2019 for New Patient (Initial Visit)   HPI    Pt had negative covid 19 screening questionnaire.   Pt is 23yoF who presents to office to establish care.  She says it has been a long time since she had PCP.  She sees gyn regularly.   Pt going to [employer] and studying  IT.    She says she is having pain.  she doesn't know if it's her back or her stomach.  She says urinating releives her pain somewhat.  Sometimes moving her bowels improves her pain as well.   No history of stomach problems.   It comes and goes - mostly in the morning and late at night when things are quiet at home.     She says she has had cough for about a month.  She got covid test which was negative.  She says it seems to be getting better.  She used inhaler in the past but it was many years ago.    She feels wheezy at times.   She is a smoker.   Pt denies depression.  Admits anxiety. She Went to [facility] in the past but hasn't been there in a long time.  Pt says her BMs are regular and normal.  She is having no dysuria.  She is eating normally.  She has no fevers.      Relevant past medical, surgical, family and social history reviewed and updated as indicated. Interim medical history since our last visit reviewed. Allergies and medications reviewed and updated.    Current Outpatient Medications:  .  Ascorbic Acid (VITA-C PO), Take 1 tablet by mouth daily., Disp: , Rfl:  .  Cyanocobalamin (VITAMIN B 12 PO), Take 1 tablet by mouth daily., Disp: , Rfl:  .  Multiple Vitamins-Minerals (HAIR SKIN AND NAILS FORMULA) TABS, Take 1 tablet by mouth daily., Disp: , Rfl:       Review of Systems  Per HPI unless specifically indicated above      Objective:    BP 100/64   Pulse 95   Temp 98.1 F (36.7 C)   Wt 205 lb 3.2 oz (93.1 kg)   SpO2 97%   BMI 36.35 kg/m   Wt Readings from Last 3 Encounters:  12/10/19 205 lb 3.2 oz (93.1 kg)  10/28/19 202 lb (91.6 kg)  10/07/19 197 lb (89.4 kg)    Physical Exam Vitals reviewed.  Constitutional:      General: She is not in acute distress.    Appearance: Normal appearance. She is well-developed. She is not ill-appearing.  HENT:     Head: Normocephalic and atraumatic.     Mouth/Throat:     Pharynx: No oropharyngeal exudate.  Eyes:     Conjunctiva/sclera: Conjunctivae normal.     Pupils: Pupils are equal, round, and reactive to light.  Neck:     Thyroid: No thyromegaly.  Cardiovascular:     Rate and Rhythm: Normal rate and regular rhythm.     Pulses:          Dorsalis pedis pulses are 2+ on the right side and  2+ on the left side.  Pulmonary:     Effort: Pulmonary effort is normal.     Breath sounds: Normal breath sounds.  Abdominal:     General: Bowel sounds are normal.     Palpations: Abdomen is soft. There is no mass.     Tenderness: There is no abdominal tenderness.  Musculoskeletal:     Cervical back: Neck supple.     Right lower leg: No edema.     Left lower leg: No edema.  Lymphadenopathy:     Cervical: No cervical adenopathy.  Skin:    General: Skin is warm and dry.  Neurological:     Mental Status: She is alert and oriented to person, place, and time.     Cranial Nerves: No facial asymmetry.     Motor: No weakness or tremor.     Gait: Gait normal.     Deep Tendon Reflexes:     Reflex Scores:      Patellar reflexes are 2+ on the right side and 2+ on the left side. Psychiatric:        Attention and Perception: Attention normal.        Mood and Affect: Mood is anxious.        Speech: Speech normal.        Behavior: Behavior normal. Behavior is cooperative.           Assessment & Plan:   Encounter Diagnoses  Name Primary?  . Encounter to  establish care Yes  . Generalized abdominal pain   . Flank pain   . Screening for diabetes mellitus   . Family history of diabetes mellitus   . Screening cholesterol level   . Anxiety      -rx inhaler -pt encouraged to Cut back on smoking or stop altogether -will check baseline Labs including UA -pt encouraged to Return to [facility] -pt to follow up in 3 weeks.  She is encouraged to contact office sooner

## 2019-12-16 ENCOUNTER — Other Ambulatory Visit: Payer: Self-pay

## 2019-12-16 ENCOUNTER — Ambulatory Visit: Payer: Medicaid Other | Attending: Internal Medicine

## 2019-12-16 DIAGNOSIS — Z20822 Contact with and (suspected) exposure to covid-19: Secondary | ICD-10-CM

## 2019-12-17 LAB — NOVEL CORONAVIRUS, NAA: SARS-CoV-2, NAA: NOT DETECTED

## 2019-12-22 ENCOUNTER — Encounter (HOSPITAL_COMMUNITY): Payer: Self-pay

## 2019-12-22 ENCOUNTER — Other Ambulatory Visit: Payer: Self-pay | Admitting: Obstetrics & Gynecology

## 2019-12-22 ENCOUNTER — Encounter (HOSPITAL_COMMUNITY)
Admission: RE | Admit: 2019-12-22 | Discharge: 2019-12-22 | Disposition: A | Discharge status: Home / Self Care | Payer: Medicaid Other | Source: Ambulatory Visit | Attending: Obstetrics & Gynecology | Admitting: Obstetrics & Gynecology

## 2019-12-22 ENCOUNTER — Other Ambulatory Visit: Payer: Self-pay

## 2019-12-22 ENCOUNTER — Other Ambulatory Visit (HOSPITAL_COMMUNITY)
Admission: RE | Admit: 2019-12-22 | Discharge: 2019-12-22 | Disposition: A | Discharge status: Home / Self Care | Payer: Medicaid Other | Source: Ambulatory Visit | Attending: Obstetrics & Gynecology | Admitting: Obstetrics & Gynecology

## 2019-12-22 ENCOUNTER — Other Ambulatory Visit (HOSPITAL_COMMUNITY)
Admission: RE | Admit: 2019-12-22 | Discharge: 2019-12-22 | Disposition: A | Discharge status: Home / Self Care | Payer: Medicaid Other | Source: Ambulatory Visit | Attending: Physician Assistant | Admitting: Physician Assistant

## 2019-12-22 DIAGNOSIS — Z20822 Contact with and (suspected) exposure to covid-19: Secondary | ICD-10-CM | POA: Insufficient documentation

## 2019-12-22 DIAGNOSIS — Z833 Family history of diabetes mellitus: Secondary | ICD-10-CM | POA: Insufficient documentation

## 2019-12-22 DIAGNOSIS — Z01812 Encounter for preprocedural laboratory examination: Secondary | ICD-10-CM | POA: Insufficient documentation

## 2019-12-22 DIAGNOSIS — Z131 Encounter for screening for diabetes mellitus: Secondary | ICD-10-CM | POA: Insufficient documentation

## 2019-12-22 DIAGNOSIS — Z13220 Encounter for screening for lipoid disorders: Secondary | ICD-10-CM | POA: Insufficient documentation

## 2019-12-22 DIAGNOSIS — R1084 Generalized abdominal pain: Secondary | ICD-10-CM | POA: Insufficient documentation

## 2019-12-22 DIAGNOSIS — R109 Unspecified abdominal pain: Secondary | ICD-10-CM | POA: Insufficient documentation

## 2019-12-22 HISTORY — DX: Cardiac arrhythmia, unspecified: I49.9

## 2019-12-22 LAB — COMPREHENSIVE METABOLIC PANEL
ALT: 14 U/L (ref 0–44)
AST: 15 U/L (ref 15–41)
Albumin: 3.6 g/dL (ref 3.5–5.0)
Alkaline Phosphatase: 78 U/L (ref 38–126)
Anion gap: 6 (ref 5–15)
BUN: 14 mg/dL (ref 6–20)
CO2: 25 mmol/L (ref 22–32)
Calcium: 8.5 mg/dL — ABNORMAL LOW (ref 8.9–10.3)
Chloride: 106 mmol/L (ref 98–111)
Creatinine, Ser: 0.61 mg/dL (ref 0.44–1.00)
GFR calc Af Amer: >60 mL/min (ref >60)
GFR calc non Af Amer: >60 mL/min (ref >60)
Glucose, Bld: 94 mg/dL (ref 70–99)
Potassium: 4.2 mmol/L (ref 3.5–5.1)
Sodium: 137 mmol/L (ref 135–145)
Total Bilirubin: 0.4 mg/dL (ref 0.3–1.2)
Total Protein: 6.7 g/dL (ref 6.5–8.1)

## 2019-12-22 LAB — CBC
HCT: 42.5 % (ref 36.0–46.0)
Hemoglobin: 13.9 g/dL (ref 12.0–15.0)
MCH: 30.2 pg (ref 26.0–34.0)
MCHC: 32.7 g/dL (ref 30.0–36.0)
MCV: 92.2 fL (ref 80.0–100.0)
Platelets: 313 10*3/uL (ref 150–400)
RBC: 4.61 MIL/uL (ref 3.87–5.11)
RDW: 13.5 % (ref 11.5–15.5)
WBC: 8.6 10*3/uL (ref 4.0–10.5)
nRBC: 0 % (ref 0.0–0.2)

## 2019-12-22 LAB — HEMOGLOBIN A1C
Hgb A1c MFr Bld: 4.9 % (ref 4.8–5.6)
Mean Plasma Glucose: 93.93 mg/dL

## 2019-12-22 LAB — RAPID HIV SCREEN (HIV 1/2 AB+AG)
HIV 1/2 Antibodies: NONREACTIVE
HIV-1 P24 Antigen - HIV24: NONREACTIVE

## 2019-12-22 LAB — LIPID PANEL
Cholesterol: 153 mg/dL (ref 0–200)
HDL: 47 mg/dL (ref >40)
LDL Cholesterol: 97 mg/dL (ref 0–99)
Total CHOL/HDL Ratio: 3.3 RATIO
Triglycerides: 46 mg/dL (ref <150)
VLDL: 9 mg/dL (ref 0–40)

## 2019-12-22 LAB — URINALYSIS, ROUTINE W REFLEX MICROSCOPIC
Bilirubin Urine: NEGATIVE
Glucose, UA: NEGATIVE mg/dL
Hgb urine dipstick: NEGATIVE
Ketones, ur: NEGATIVE mg/dL
Leukocytes,Ua: NEGATIVE
Nitrite: NEGATIVE
Protein, ur: NEGATIVE mg/dL
Specific Gravity, Urine: 1.012 (ref 1.005–1.030)
pH: 7 (ref 5.0–8.0)

## 2019-12-22 LAB — HCG, QUANTITATIVE, PREGNANCY: hCG, Beta Chain, Quant, S: <1 m[IU]/mL (ref <5)

## 2019-12-22 LAB — SARS CORONAVIRUS 2 (TAT 6-24 HRS): SARS Coronavirus 2: NEGATIVE

## 2019-12-22 LAB — TSH: TSH: 0.946 u[IU]/mL (ref 0.350–4.500)

## 2019-12-22 NOTE — Patient Instructions (Addendum)
Your procedure is scheduled on: 12/24/2019  Report to [facility] at   8:30  AM.  Call this number if you have problems the morning of surgery: [phone]   Remember:   Do not Eat or Drink after midnight         No Smoking the morning of surgery  :  Take these medicines the morning of surgery with A SIP OF WATER: none  Use albuterol inhaler if needed   Do not wear jewelry, make-up or nail polish.  Do not wear lotions, powders, or perfumes. You may wear deodorant.  Do not shave 48 hours prior to surgery. Men may shave face and neck.  Do not bring valuables to the hospital.  Contacts, dentures or bridgework may not be worn into surgery.  Leave suitcase in the car. After surgery it may be brought to your room.  For patients admitted to the hospital, checkout time is 11:00 AM the day of discharge.   Patients discharged the day of surgery will not be allowed to drive home.    Special Instructions: Shower using CHG night before surgery and shower the day of surgery use CHG.  Use special wash - you have one bottle of CHG for all showers.  You should use approximately 1/2 of the bottle for each shower.   Removal of lesion, Care After This sheet gives you information about how to care for yourself after your procedure. Your health care provider may also give you more specific instructions. If you have problems or questions, contact your health care provider. What can I expect after the procedure? After the procedure, it is common to have:  Slight bleeding or fluid coming from your incision.  Mild pain.  Swelling. Follow these instructions at home: Incision care   Follow instructions from your health care provider about how to take care of your incision.  Leave stitches (sutures), skin glue, or adhesive strips in place. These skin closures may need to stay in place for 2 weeks or longer. If adhesive strip edges start to loosen and curl up, you may trim the loose edges. Do not remove  adhesive strips completely unless your health care provider tells you to do that.  Check your incision area every day for signs of infection. Check for: ? Redness, swelling, or more pain. ? More fluid or blood. ? Warmth. ? Vaginal discharge or a bad smell. Medicines  Apply antibiotic ointment as told by your health care provider.  Take over-the-counter and prescription medicines only as told by your health care provider. Driving  Do not drive or use heavy machinery while taking prescription pain medicine.  Do not drive for 24 hours if you were given a medicine to help you relax (sedative) during your procedure. Activity  For 4 weeks, or as long as directed: ? Do not place anything in your vagina, including tampons. ? Do not have vaginal sex. ? Do not wear tight clothing, especially tight underwear.  Return to your normal activities as told by your health care provider. Ask your health care provider what activities are safe for you. Bathing  Do not take baths, swim, or use a hot tub until your health care provider approves. Ask your health care provider if you may take showers. You may only be allowed to take sponge baths.  Use mild soap and water to clean your incision area as needed. Managing pain and swelling  To reduce pain or swelling, put ice on the affected area: ?  Put ice in a plastic bag. ? Place the bag between your dressing and your underwear. Make sure your underwear holds the ice bag in place. ? Leave the ice on for 20 minutes, 2-3 times a day.  When lying down, put a pillow under your hips. This will help to reduce swelling. General instructions  Do not use any products that contain nicotine or tobacco, such as cigarettes and e-cigarettes. These can delay healing and increase the risk that your incision will open. If you need help quitting, ask your health care provider.  To prevent or treat constipation while you are taking prescription pain medicine, your  health care provider may recommend that you: ? Drink enough fluid to keep your urine pale yellow. ? Take over-the-counter or prescription medicines. ? Eat foods that are high in fiber, such as fresh fruits and vegetables, whole grains, and beans. ? Limit foods that are high in fat and processed sugars, such as fried and sweet foods.  Keep all follow-up visits as told by your health care provider. This is important. Contact a health care provider if:  You have chills or a fever.  You have redness, swelling, or more pain around your incision.  You have more fluid or blood coming from your incision.  Your incision feels warm to the touch.  You have vaginal discharge or a bad smell coming from your incision area.  You have vaginal dryness or pain during sex after 4 weeks. Get help right away if:  You have bleeding that does not stop after 10 minutes of applying firm pressure. Summary  For the first few days after labiaplasty, you may have some mild pain and swelling.  For as long as directed by your health care provider, do not place anything in your vagina, have vaginal sex, or wear tight clothing. This information is not intended to replace advice given to you by your health care provider. Make sure you discuss any questions you have with your health care provider. Document Revised: 08/02/2018 Document Reviewed: 04/17/2017 Elsevier Patient Education  2020 [employer]  General Anesthesia, Adult, Care After This sheet gives you information about how to care for yourself after your procedure. Your health care provider may also give you more specific instructions. If you have problems or questions, contact your health care provider. What can I expect after the procedure? After the procedure, the following side effects are common:  Pain or discomfort at the IV site.  Nausea.  Vomiting.  Sore throat.  Trouble concentrating.  Feeling cold or chills.  Weak or  tired.  Sleepiness and fatigue.  Soreness and body aches. These side effects can affect parts of the body that were not involved in surgery. Follow these instructions at home:  For at least 24 hours after the procedure:  Have a responsible adult stay with you. It is important to have someone help care for you until you are awake and alert.  Rest as needed.  Do not: ? Participate in activities in which you could fall or become injured. ? Drive. ? Use heavy machinery. ? Drink alcohol. ? Take sleeping pills or medicines that cause drowsiness. ? Make important decisions or sign legal documents. ? Take care of children on your own. Eating and drinking  Follow any instructions from your health care provider about eating or drinking restrictions.  When you feel hungry, start by eating small amounts of foods that are soft and easy to digest (bland), such as toast. Gradually return to  your regular diet.  Drink enough fluid to keep your urine pale yellow.  If you vomit, rehydrate by drinking water, juice, or clear broth. General instructions  If you have sleep apnea, surgery and certain medicines can increase your risk for breathing problems. Follow instructions from your health care provider about wearing your sleep device: ? Anytime you are sleeping, including during daytime naps. ? While taking prescription pain medicines, sleeping medicines, or medicines that make you drowsy.  Return to your normal activities as told by your health care provider. Ask your health care provider what activities are safe for you.  Take over-the-counter and prescription medicines only as told by your health care provider.  If you smoke, do not smoke without supervision.  Keep all follow-up visits as told by your health care provider. This is important. Contact a health care provider if:  You have nausea or vomiting that does not get better with medicine.  You cannot eat or drink without  vomiting.  You have pain that does not get better with medicine.  You are unable to pass urine.  You develop a skin rash.  You have a fever.  You have redness around your IV site that gets worse. Get help right away if:  You have difficulty breathing.  You have chest pain.  You have blood in your urine or stool, or you vomit blood. Summary  After the procedure, it is common to have a sore throat or nausea. It is also common to feel tired.  Have a responsible adult stay with you for the first 24 hours after general anesthesia. It is important to have someone help care for you until you are awake and alert.  When you feel hungry, start by eating small amounts of foods that are soft and easy to digest (bland), such as toast. Gradually return to your regular diet.  Drink enough fluid to keep your urine pale yellow.  Return to your normal activities as told by your health care provider. Ask your health care provider what activities are safe for you. This information is not intended to replace advice given to you by your health care provider. Make sure you discuss any questions you have with your health care provider. Document Revised: 10/12/2017 Document Reviewed: 05/25/2017 Elsevier Patient Education  [place].

## 2019-12-23 NOTE — Progress Notes (Signed)
EKG reviewed per Dr Alva Garnet. EKG okay. No new orders given. Cleared to proceed with procedure 12/24/2019.

## 2019-12-24 ENCOUNTER — Ambulatory Visit (HOSPITAL_COMMUNITY)
Admission: RE | Admit: 2019-12-24 | Discharge: 2019-12-24 | Disposition: A | Discharge status: Home / Self Care | Payer: Self-pay | Attending: Obstetrics & Gynecology | Admitting: Obstetrics & Gynecology

## 2019-12-24 ENCOUNTER — Encounter (HOSPITAL_COMMUNITY)
Admission: RE | Disposition: A | Discharge status: Home / Self Care | Payer: Self-pay | Source: Home / Self Care | Attending: Obstetrics & Gynecology

## 2019-12-24 ENCOUNTER — Ambulatory Visit (HOSPITAL_COMMUNITY): Payer: Self-pay | Admitting: Anesthesiology

## 2019-12-24 ENCOUNTER — Encounter (HOSPITAL_COMMUNITY): Payer: Self-pay | Admitting: Obstetrics & Gynecology

## 2019-12-24 ENCOUNTER — Other Ambulatory Visit: Payer: Self-pay

## 2019-12-24 DIAGNOSIS — Z825 Family history of asthma and other chronic lower respiratory diseases: Secondary | ICD-10-CM | POA: Insufficient documentation

## 2019-12-24 DIAGNOSIS — Z8249 Family history of ischemic heart disease and other diseases of the circulatory system: Secondary | ICD-10-CM | POA: Insufficient documentation

## 2019-12-24 DIAGNOSIS — Z833 Family history of diabetes mellitus: Secondary | ICD-10-CM | POA: Insufficient documentation

## 2019-12-24 DIAGNOSIS — Z801 Family history of malignant neoplasm of trachea, bronchus and lung: Secondary | ICD-10-CM | POA: Insufficient documentation

## 2019-12-24 DIAGNOSIS — N9089 Other specified noninflammatory disorders of vulva and perineum: Secondary | ICD-10-CM

## 2019-12-24 DIAGNOSIS — F17210 Nicotine dependence, cigarettes, uncomplicated: Secondary | ICD-10-CM | POA: Insufficient documentation

## 2019-12-24 DIAGNOSIS — L720 Epidermal cyst: Secondary | ICD-10-CM | POA: Insufficient documentation

## 2019-12-24 DIAGNOSIS — F329 Major depressive disorder, single episode, unspecified: Secondary | ICD-10-CM | POA: Insufficient documentation

## 2019-12-24 DIAGNOSIS — F419 Anxiety disorder, unspecified: Secondary | ICD-10-CM | POA: Insufficient documentation

## 2019-12-24 DIAGNOSIS — F4310 Post-traumatic stress disorder, unspecified: Secondary | ICD-10-CM | POA: Insufficient documentation

## 2019-12-24 DIAGNOSIS — R000 Tachycardia, unspecified: Secondary | ICD-10-CM | POA: Insufficient documentation

## 2019-12-24 DIAGNOSIS — Z803 Family history of malignant neoplasm of breast: Secondary | ICD-10-CM | POA: Insufficient documentation

## 2019-12-24 DIAGNOSIS — Z791 Long term (current) use of non-steroidal anti-inflammatories (NSAID): Secondary | ICD-10-CM | POA: Insufficient documentation

## 2019-12-24 DIAGNOSIS — Z823 Family history of stroke: Secondary | ICD-10-CM | POA: Insufficient documentation

## 2019-12-24 DIAGNOSIS — Z811 Family history of alcohol abuse and dependence: Secondary | ICD-10-CM | POA: Insufficient documentation

## 2019-12-24 DIAGNOSIS — Z8261 Family history of arthritis: Secondary | ICD-10-CM | POA: Insufficient documentation

## 2019-12-24 DIAGNOSIS — Z87442 Personal history of urinary calculi: Secondary | ICD-10-CM | POA: Insufficient documentation

## 2019-12-24 DIAGNOSIS — Z882 Allergy status to sulfonamides status: Secondary | ICD-10-CM | POA: Insufficient documentation

## 2019-12-24 HISTORY — PX: EXCISION VAGINAL CYST: SHX5825

## 2019-12-24 SURGERY — EXCISION, CYST, VAGINA
Anesthesia: General | Laterality: Left

## 2019-12-24 MED ORDER — BUPIVACAINE LIPOSOME 1.3 % IJ SUSP
INTRAMUSCULAR | Status: AC
  Filled 2019-12-24: qty 20

## 2019-12-24 MED ORDER — MEPERIDINE HCL 50 MG/ML IJ SOLN: 6.2500 mg | INTRAMUSCULAR | Status: DC | PRN

## 2019-12-24 MED ORDER — PROMETHAZINE HCL 25 MG/ML IJ SOLN: 6.2500 mg | INTRAMUSCULAR | Status: DC | PRN

## 2019-12-24 MED ORDER — BACITRACIN-NEOMYCIN-POLYMYXIN 400-5-5000 EX OINT
TOPICAL_OINTMENT | CUTANEOUS | Status: AC
  Filled 2019-12-24: qty 1

## 2019-12-24 MED ORDER — HYDROCODONE-ACETAMINOPHEN 5-325 MG PO TABS: 1.0000 | ORAL_TABLET | Freq: Four times a day (QID) | ORAL | 0 refills | Status: DC | PRN

## 2019-12-24 MED ORDER — KETOROLAC TROMETHAMINE 30 MG/ML IJ SOLN
30.0000 mg | Freq: Once | INTRAMUSCULAR | Status: AC
  Administered 2019-12-24: 10:00:00 30 mg via INTRAVENOUS
  Filled 2019-12-24: qty 1

## 2019-12-24 MED ORDER — ONDANSETRON HCL 4 MG/2ML IJ SOLN
INTRAMUSCULAR | Status: AC
  Filled 2019-12-24: qty 2

## 2019-12-24 MED ORDER — LACTATED RINGERS IV SOLN: INTRAVENOUS | Status: DC | PRN

## 2019-12-24 MED ORDER — FENTANYL CITRATE (PF) 100 MCG/2ML IJ SOLN
INTRAMUSCULAR | Status: DC | PRN
  Administered 2019-12-24: 50 ug via INTRAVENOUS

## 2019-12-24 MED ORDER — CEFAZOLIN SODIUM-DEXTROSE 2-4 GM/100ML-% IV SOLN
2.0000 g | INTRAVENOUS | Status: AC
  Administered 2019-12-24: 2 g via INTRAVENOUS
  Filled 2019-12-24: qty 100

## 2019-12-24 MED ORDER — DOXYCYCLINE HYCLATE 100 MG PO TABS: 100.0000 mg | ORAL_TABLET | Freq: Two times a day (BID) | ORAL | 0 refills | Status: DC

## 2019-12-24 MED ORDER — SEVOFLURANE IN SOLN
RESPIRATORY_TRACT | Status: AC
  Filled 2019-12-24: qty 250

## 2019-12-24 MED ORDER — BUPIVACAINE LIPOSOME 1.3 % IJ SUSP
20.0000 mL | Freq: Once | INTRAMUSCULAR | Status: DC
  Filled 2019-12-24: qty 20

## 2019-12-24 MED ORDER — LIDOCAINE HCL (CARDIAC) PF 100 MG/5ML IV SOSY
PREFILLED_SYRINGE | INTRAVENOUS | Status: DC | PRN
  Administered 2019-12-24: 80 mg via INTRAVENOUS

## 2019-12-24 MED ORDER — EPHEDRINE 5 MG/ML INJ
INTRAVENOUS | Status: AC
  Filled 2019-12-24: qty 10

## 2019-12-24 MED ORDER — PROPOFOL 10 MG/ML IV BOLUS
INTRAVENOUS | Status: AC
  Filled 2019-12-24: qty 20

## 2019-12-24 MED ORDER — MIDAZOLAM HCL 2 MG/2ML IJ SOLN
2.0000 mg | Freq: Once | INTRAMUSCULAR | Status: AC
  Administered 2019-12-24: 10:00:00 2 mg via INTRAVENOUS
  Filled 2019-12-24: qty 2

## 2019-12-24 MED ORDER — SODIUM CHLORIDE 0.9 % IR SOLN
Status: DC | PRN
  Administered 2019-12-24: 1000 mL

## 2019-12-24 MED ORDER — HYDROMORPHONE HCL 1 MG/ML IJ SOLN: 0.2500 mg | INTRAMUSCULAR | Status: DC | PRN

## 2019-12-24 MED ORDER — MIDAZOLAM HCL 2 MG/2ML IJ SOLN
INTRAMUSCULAR | Status: AC
  Filled 2019-12-24: qty 2

## 2019-12-24 MED ORDER — IBUPROFEN 800 MG PO TABS: 800.0000 mg | ORAL_TABLET | Freq: Three times a day (TID) | ORAL | 1 refills | Status: DC | PRN

## 2019-12-24 MED ORDER — LIDOCAINE 2% (20 MG/ML) 5 ML SYRINGE
INTRAMUSCULAR | Status: AC
  Filled 2019-12-24: qty 5

## 2019-12-24 MED ORDER — BACITRACIN-NEOMYCIN-POLYMYXIN 400-5-5000 EX OINT
TOPICAL_OINTMENT | CUTANEOUS | Status: DC | PRN
  Administered 2019-12-24: 1 via TOPICAL

## 2019-12-24 MED ORDER — DEXAMETHASONE SODIUM PHOSPHATE 10 MG/ML IJ SOLN
INTRAMUSCULAR | Status: AC
  Filled 2019-12-24: qty 1

## 2019-12-24 MED ORDER — LACTATED RINGERS IV SOLN
Freq: Once | INTRAVENOUS | Status: AC
  Administered 2019-12-24: 09:00:00 1000 mL via INTRAVENOUS

## 2019-12-24 MED ORDER — BUPIVACAINE-EPINEPHRINE (PF) 0.5% -1:200000 IJ SOLN
INTRAMUSCULAR | Status: AC
  Filled 2019-12-24: qty 30

## 2019-12-24 MED ORDER — ONDANSETRON HCL 4 MG/2ML IJ SOLN
INTRAMUSCULAR | Status: DC | PRN
  Administered 2019-12-24: 4 mg via INTRAVENOUS

## 2019-12-24 MED ORDER — BUPIVACAINE LIPOSOME 1.3 % IJ SUSP
INTRAMUSCULAR | Status: DC | PRN
  Administered 2019-12-24: 20 mL

## 2019-12-24 MED ORDER — FENTANYL CITRATE (PF) 250 MCG/5ML IJ SOLN
INTRAMUSCULAR | Status: AC
  Filled 2019-12-24: qty 5

## 2019-12-24 MED ORDER — PROPOFOL 10 MG/ML IV BOLUS
INTRAVENOUS | Status: DC | PRN
  Administered 2019-12-24: 200 mg via INTRAVENOUS

## 2019-12-24 MED ORDER — EPHEDRINE SULFATE 50 MG/ML IJ SOLN
INTRAMUSCULAR | Status: DC | PRN
  Administered 2019-12-24: 5 mg via INTRAVENOUS

## 2019-12-24 MED ORDER — PHENYLEPHRINE 40 MCG/ML (10ML) SYRINGE FOR IV PUSH (FOR BLOOD PRESSURE SUPPORT)
PREFILLED_SYRINGE | INTRAVENOUS | Status: AC
  Filled 2019-12-24: qty 10

## 2019-12-24 SURGICAL SUPPLY — 28 items
BAG HAMPER (MISCELLANEOUS) ×3 IMPLANT
CLOTH BEACON ORANGE TIMEOUT ST (SAFETY) ×3 IMPLANT
COVER LIGHT HANDLE STERIS (MISCELLANEOUS) ×6 IMPLANT
COVER WAND RF STERILE (DRAPES) ×3 IMPLANT
DECANTER SPIKE VIAL GLASS SM (MISCELLANEOUS) ×3 IMPLANT
DRAPE HALF SHEET 40X57 (DRAPES) ×3 IMPLANT
ELECT REM PT RETURN 9FT ADLT (ELECTROSURGICAL) ×3
ELECTRODE REM PT RTRN 9FT ADLT (ELECTROSURGICAL) ×1 IMPLANT
GAUZE 4X4 16PLY RFD (DISPOSABLE) ×3 IMPLANT
GLOVE BIOGEL PI IND STRL 7.0 (GLOVE) ×2 IMPLANT
GLOVE BIOGEL PI IND STRL 8 (GLOVE) ×1 IMPLANT
GLOVE BIOGEL PI INDICATOR 7.0 (GLOVE) ×4
GLOVE BIOGEL PI INDICATOR 8 (GLOVE) ×2
GLOVE ECLIPSE 6.5 STRL STRAW (GLOVE) ×3 IMPLANT
GLOVE ECLIPSE 8.0 STRL XLNG CF (GLOVE) ×3 IMPLANT
GOWN STRL REUS W/TWL LRG LVL3 (GOWN DISPOSABLE) ×3 IMPLANT
GOWN STRL REUS W/TWL XL LVL3 (GOWN DISPOSABLE) ×3 IMPLANT
KIT TURNOVER CYSTO (KITS) ×3 IMPLANT
MANIFOLD NEPTUNE II (INSTRUMENTS) ×3 IMPLANT
NEEDLE HYPO 18GX1.5 BLUNT FILL (NEEDLE) ×3 IMPLANT
NEEDLE HYPO 22GX1.5 SAFETY (NEEDLE) ×3 IMPLANT
NS IRRIG 1000ML POUR BTL (IV SOLUTION) ×3 IMPLANT
PACK PERI GYN (CUSTOM PROCEDURE TRAY) ×3 IMPLANT
PAD ARMBOARD 7.5X6 YLW CONV (MISCELLANEOUS) ×3 IMPLANT
SET BASIN LINEN APH (SET/KITS/TRAYS/PACK) ×3 IMPLANT
SUT MON AB 3-0 SH 27 (SUTURE) ×9 IMPLANT
SYR 20ML LL LF (SYRINGE) ×3 IMPLANT
SYR 30ML LL (SYRINGE) ×3 IMPLANT

## 2019-12-24 NOTE — H&P (Signed)
Preoperative History and Physical  Tracie Aguilar is a 34 y.o. G5P3 with Patient's last menstrual period was 12/15/2019. admitted for a removal of left periclitoral lesion.  Present for over 1 year I saw her n 12/20 and tried a course of doxycycline and silvadene with no improvement and certainly no resolution Because the lesion is very close to the clitoris I was concerned of disruption of either the blood supply or the neural supply to the clitioris and since it Is in difficult anatomical position for outpatient removal so admitted for OR based removal  PMH:    Past Medical History:  Diagnosis Date  . Anxiety   . Asthma    childhood asthma  . Depression   . Dysrhythmia   . Kidney stones 2007   passed x2  . Post traumatic stress disorder    prior medications   . Tachycardia     PSH:     Past Surgical History:  Procedure Laterality Date  . TUBAL LIGATION  2013    POb/GynH:      OB History    Gravida  5   Para  3   Term      Preterm      AB      Living        SAB      TAB      Ectopic      Multiple      Live Births              SH:   Social History   Tobacco Use  . Smoking status: Current Every Day Smoker    Packs/day: 1.50    Years: 17.00    Pack years: 25.50    Types: Cigarettes    Start date: 10/23/2001  . Smokeless tobacco: Never Used  Substance Use Topics  . Alcohol use: Yes    Alcohol/week: 0.0 standard drinks    Comment: occassinal  . Drug use: Yes    Types: Marijuana    FH:    Family History  Problem Relation Age of Onset  . COPD Mother   . Alcohol abuse Mother   . Arthritis Mother        OA  . Hypertension Mother   . Alcohol abuse Father   . Hypertension Father   . Diabetes Father   . Lung cancer Maternal Grandmother   . Stroke Maternal Grandfather   . Hypertension Maternal Grandfather   . Diabetes Maternal Grandfather   . Stroke Paternal Grandfather   . Breast cancer Cousin   . Diabetes Cousin       Allergies:  Allergies  Allergen Reactions  . Sulfa Antibiotics Hives    Medications:       Current Facility-Administered Medications:  .  bupivacaine liposome (EXPAREL) 1.3 % injection 266 mg, 20 mL, Infiltration, Once, Marquasha Brutus H, MD .  ceFAZolin (ANCEF) IVPB 2g/100 mL premix, 2 g, Intravenous, On Call to OR, Florian Buff, MD .  ketorolac (TORADOL) 30 MG/ML injection 30 mg, 30 mg, Intravenous, Once, Florian Buff, MD .  midazolam (VERSED) injection 2 mg, 2 mg, Intravenous, Once, Battula, Rajamani C, MD  Review of Systems:   Review of Systems  Constitutional: Negative for fever, chills, weight loss, malaise/fatigue and diaphoresis.  HENT: Negative for hearing loss, ear pain, nosebleeds, congestion, sore throat, neck pain, tinnitus and ear discharge.   Eyes: Negative for blurred vision, double vision, photophobia, pain, discharge and redness.  Respiratory: Negative for  cough, hemoptysis, sputum production, shortness of breath, wheezing and stridor.   Cardiovascular: Negative for chest pain, palpitations, orthopnea, claudication, leg swelling and PND.  Gastrointestinal: Positive for abdominal pain. Negative for heartburn, nausea, vomiting, diarrhea, constipation, blood in stool and melena.  Genitourinary: Negative for dysuria, urgency, frequency, hematuria and flank pain.  Musculoskeletal: Negative for myalgias, back pain, joint pain and falls.  Skin: Negative for itching and rash.  Neurological: Negative for dizziness, tingling, tremors, sensory change, speech change, focal weakness, seizures, loss of consciousness, weakness and headaches.  Endo/Heme/Allergies: Negative for environmental allergies and polydipsia. Does not bruise/bleed easily.  Psychiatric/Behavioral: Negative for depression, suicidal ideas, hallucinations, memory loss and substance abuse. The patient is not nervous/anxious and does not have insomnia.      PHYSICAL EXAM:  Last menstrual period  12/15/2019.    Vitals reviewed. Constitutional: She is oriented to person, place, and time. She appears well-developed and well-nourished.  HENT:  Head: Normocephalic and atraumatic.  Right Ear: External ear normal.  Left Ear: External ear normal.  Nose: Nose normal.  Mouth/Throat: Oropharynx is clear and moist.  Eyes: Conjunctivae and EOM are normal. Pupils are equal, round, and reactive to light. Right eye exhibits no discharge. Left eye exhibits no discharge. No scleral icterus.  Neck: Normal range of motion. Neck supple. No tracheal deviation present. No thyromegaly present.  Cardiovascular: Normal rate, regular rhythm, normal heart sounds and intact distal pulses.  Exam reveals no gallop and no friction rub.   No murmur heard. Respiratory: Effort normal and breath sounds normal. No respiratory distress. She has no wheezes. She has no rales. She exhibits no tenderness.  GI: Soft. Bowel sounds are normal. She exhibits no distension and no mass. There is tenderness. There is no rebound and no guarding.  Genitourinary:       Vulva is normal without lesions, small draining lesion just to the left of the clitoris  Vagina is pink moist without discharge Cervix normal in appearance and pap is normal Uterus is normal size, contour, position, consistency, mobility, non-tender Adnexa is negative with normal sized ovaries by sonogram  Musculoskeletal: Normal range of motion. She exhibits no edema and no tenderness.  Neurological: She is alert and oriented to person, place, and time. She has normal reflexes. She displays normal reflexes. No cranial nerve deficit. She exhibits normal muscle tone. Coordination normal.  Skin: Skin is warm and dry. No rash noted. No erythema. No pallor.  Psychiatric: She has a normal mood and affect. Her behavior is normal. Judgment and thought content normal.    Labs: Results for orders placed or performed during the hospital encounter of 12/22/19 (from the past  336 hour(s))  Rapid HIV screen (HIV 1/2 Ab+Ag)   Collection Time: 12/22/19  3:00 PM  Result Value Ref Range   HIV-1 P24 Antigen - HIV24 NON REACTIVE NON REACTIVE   HIV 1/2 Antibodies NON REACTIVE NON REACTIVE   Interpretation (HIV Ag Ab)      A non reactive test result means that HIV 1 or HIV 2 antibodies and HIV 1 p24 antigen were not detected in the specimen.  Urinalysis, Routine w reflex microscopic   Collection Time: 12/22/19  3:00 PM  Result Value Ref Range   Color, Urine YELLOW YELLOW   APPearance HAZY (A) CLEAR   Specific Gravity, Urine 1.012 1.005 - 1.030   pH 7.0 5.0 - 8.0   Glucose, UA NEGATIVE NEGATIVE mg/dL   Hgb urine dipstick NEGATIVE NEGATIVE   Bilirubin Urine NEGATIVE NEGATIVE  Ketones, ur NEGATIVE NEGATIVE mg/dL   Protein, ur NEGATIVE NEGATIVE mg/dL   Nitrite NEGATIVE NEGATIVE   Leukocytes,Ua NEGATIVE NEGATIVE  hCG, quantitative, pregnancy   Collection Time: 12/22/19  3:01 PM  Result Value Ref Range   hCG, Beta Chain, Quant, S <1 <5 mIU/mL  Results for orders placed or performed during the hospital encounter of 12/22/19 (from the past 336 hour(s))  Hemoglobin A1c   Collection Time: 12/22/19  8:27 AM  Result Value Ref Range   Hgb A1c MFr Bld 4.9 4.8 - 5.6 %   Mean Plasma Glucose 93.93 mg/dL  CBC   Collection Time: 12/22/19  8:29 AM  Result Value Ref Range   WBC 8.6 4.0 - 10.5 K/uL   RBC 4.61 3.87 - 5.11 MIL/uL   Hemoglobin 13.9 12.0 - 15.0 g/dL   HCT 81.1 57.2 - 62.0 %   MCV 92.2 80.0 - 100.0 fL   MCH 30.2 26.0 - 34.0 pg   MCHC 32.7 30.0 - 36.0 g/dL   RDW 35.5 97.4 - 16.3 %   Platelets 313 150 - 400 K/uL   nRBC 0.0 0.0 - 0.2 %  TSH   Collection Time: 12/22/19  8:29 AM  Result Value Ref Range   TSH 0.946 0.350 - 4.500 uIU/mL  Comprehensive metabolic panel   Collection Time: 12/22/19  8:29 AM  Result Value Ref Range   Sodium 137 135 - 145 mmol/L   Potassium 4.2 3.5 - 5.1 mmol/L   Chloride 106 98 - 111 mmol/L   CO2 25 22 - 32 mmol/L   Glucose, Bld  94 70 - 99 mg/dL   BUN 14 6 - 20 mg/dL   Creatinine, Ser 8.45 0.44 - 1.00 mg/dL   Calcium 8.5 (L) 8.9 - 10.3 mg/dL   Total Protein 6.7 6.5 - 8.1 g/dL   Albumin 3.6 3.5 - 5.0 g/dL   AST 15 15 - 41 U/L   ALT 14 0 - 44 U/L   Alkaline Phosphatase 78 38 - 126 U/L   Total Bilirubin 0.4 0.3 - 1.2 mg/dL   GFR calc non Af Amer >60 >60 mL/min   GFR calc Af Amer >60 >60 mL/min   Anion gap 6 5 - 15  Lipid panel   Collection Time: 12/22/19  8:29 AM  Result Value Ref Range   Cholesterol 153 0 - 200 mg/dL   Triglycerides 46 <364 mg/dL   HDL 47 >68 mg/dL   Total CHOL/HDL Ratio 3.3 RATIO   VLDL 9 0 - 40 mg/dL   LDL Cholesterol 97 0 - 99 mg/dL  Results for orders placed or performed during the hospital encounter of 12/22/19 (from the past 336 hour(s))  SARS CORONAVIRUS 2 (TAT 6-24 HRS) Nasopharyngeal Nasopharyngeal Swab   Collection Time: 12/22/19  7:16 AM   Specimen: Nasopharyngeal Swab  Result Value Ref Range   SARS Coronavirus 2 NEGATIVE NEGATIVE  Results for orders placed or performed in visit on 12/16/19 (from the past 336 hour(s))  Novel Coronavirus, NAA ([facility])   Collection Time: 12/16/19  2:26 PM   Specimen: Nasopharyngeal(NP) swabs in vial transport medium   NASOPHARYNGE  TESTING  Result Value Ref Range   SARS-CoV-2, NAA Not Detected Not Detected    EKG: Orders placed or performed in visit on 12/22/19  . EKG 12-Lead    Imaging Studies: No results found.    Assessment: Periclitoral draining lesion just to the left of midline  Plan: Removal of left periclitoral lesion  Lazaro Arms 12/24/2019  9:30 AM

## 2019-12-24 NOTE — Anesthesia Postprocedure Evaluation (Signed)
Anesthesia Post Note  Patient: Tracie Aguilar  Procedure(s) Performed: REMOVAL OF LEFT PERICLITORAL LESION (Left )  Patient location during evaluation: PACU Anesthesia Type: General Level of consciousness: awake and alert Pain management: pain level controlled Vital Signs Assessment: post-procedure vital signs reviewed and stable Respiratory status: spontaneous breathing, nonlabored ventilation and respiratory function stable Cardiovascular status: stable Postop Assessment: no apparent nausea or vomiting Anesthetic complications: no     Last Vitals:  Vitals:   12/24/19 0927 12/24/19 1035  BP: 113/62 (!) 102/46  Pulse: 66 (!) 33  Resp: 18 14  Temp: 36.7 C 36.6 C  SpO2: 96% (P) 99%    Last Pain:  Vitals:   12/24/19 1035  TempSrc:   PainSc: (P) 0-No pain                 Levander Katzenstein Hristova

## 2019-12-24 NOTE — Op Note (Signed)
Preop diagnosis: Persistently draining left periclitoral lesion unresponsive to antibiotics and topical antimicrobials  Postop diagnosis: Same as above  Procedure: Excision of left periclitoral lesion requiring 3 layer closure  Surgeon:Darian Cansler Lauretta Chester, MD   Anesthesia: Laryngeal mask airway  Findings:\  I saw the patient initially in December 2020 for a lesion of her vulva that had been present for approximately 11 months at that time.  The patient states that it would episodically become red and inflamed and then would drain fluid.  This would happen on almost a monthly basis or more for nearly a year.  She said at times the discharge was foul-smelling but she never got the impression that it was overtly infected.  Discomfort was minimal. When I saw her in December I placed her on oral doxycycline and topical Silvadene cream and reexamined her 3 weeks later with no improvement.  Did not appear to be a sebaceous cyst and it certainly was none of the anatomical cyst that we can see such as King's gland other paravaginal remnant cyst or Bartholin's gland cyst.  This was well away and as stated previously adjacent to the clitoris just left of the midline.  Because of the anatomical complexity of the region I did not want to do an outpatient removal so as not to embarrass the clitoral blood supply or the innervation.  There were no other significant findings at the time of surgery  Description of operation. Patient was taken to the operating room where she was placed in dorsal lithotomy position in candycane stirrups She was prepped and draped in usual sterile fashion Downward and medial traction was placed in the area of the lesion A left lateral semilunar incision was made and I then use this tissue to retract laterally left to minimize the risk of damage to the clitoral blood supply and innervation I then used a knife to completely excise the lesion both medially and in the subcutaneous  tissue. It was removed in total and intact Hemostasis was achieved with the electrocautery unit A 3 layer closure was then performed A deep interrupted subcutaneous later layer was placed Followed by second interrupted subcutaneous layer Finally a superficial layer was placed through the skin for additional hemostasis and pressure on the area There was good hemostasis at this point 30 cc of Exparel was injected into the site for postoperative pain management Neosporin was placed No dressing could be maintained in this area Experienced about 20 cc of blood loss She received 2 g of Ancef and 30 mg of Toradol IV preoperatively She was awakened from anesthesia taken recovery in good stable condition all counts correct x3 and winding was negative  Lazaro Arms, MD 12/24/2019 10:49 AM

## 2019-12-24 NOTE — Anesthesia Procedure Notes (Signed)
Procedure Name: LMA Insertion Date/Time: 12/24/2019 9:43 AM Performed by: Elgie Congo, CRNA Pre-anesthesia Checklist: Patient identified, Emergency Drugs available, Suction available and Patient being monitored Patient Re-evaluated:Patient Re-evaluated prior to induction Oxygen Delivery Method: Circle system utilized Preoxygenation: Pre-oxygenation with 100% oxygen Induction Type: IV induction LMA: LMA inserted LMA Size: 4.0 Tube type: Oral Number of attempts: 1 Placement Confirmation: positive ETCO2 and breath sounds checked- equal and bilateral Tube secured with: Tape Dental Injury: Teeth and Oropharynx as per pre-operative assessment

## 2019-12-24 NOTE — Discharge Instructions (Signed)
Vulvectomy, Care After This sheet gives you information about how to care for yourself after your procedure. Your health care provider may also give you more specific instructions. If you have problems or questions, contact your health care provider. What can I expect after the procedure? After the procedure, it is common to have:  Vaginal pain.  Vaginal numbness.  Vaginal swelling.  Bloody vaginal discharge. Follow these instructions at home: Activity   Rest as told by your health care provider.  Do not lift, push, or pull more than 5 lb (2.3 kg), or the limit that you are told, until your health care provider says that it is safe.  Avoid activities that take a lot of effort for as long as told by your health care provider. This includes any exercise.  Raise (elevate) your legs while sitting or lying down.  Avoid standing or sitting in one place for long periods of time.  Do not cross your legs, especially when sitting, until your health care provider approves.  Return to your normal activities as told by your health care provider. Ask your health care provider what activities are safe for you. Bathing   Do not take baths, swim, or use a hot tub until your health care provider approves. Ask your health care provider if you can take showers. You may only be allowed to take sponge baths.  After passing urine or a bowel movement, wipe yourself from front to back and clean your vaginal area using a spray bottle.  If told by your health care provider, take a sitz bath to help with discomfort. This is a warm water bath you take while sitting down. ? Do this 3-4 times a day, or as often as told by your health care provider. ? The water should only come up to your hips and cover your buttocks. ? You may pat the area dry with a soft, clean towel. ? If needed, you may then gently dry the area with a hair dryer on a cool setting for 5-10 minutes. An enclosed box fan may also be used to  gently dry the area. Incision care   Follow instructions from your health care provider about how to take care of your incision areas. Make sure you: ? Wash your hands with soap and water before and after you change your bandages (dressing). If soap and water are not available, use hand sanitizer. ? Change your dressing as told by your health care provider. ? Leave stitches (sutures), skin glue, adhesive strips, or surgical clips in place. These skin closures may need to stay in place for 2 weeks or longer. If adhesive strip edges start to loosen and curl up, you may trim the loose edges. Do not remove adhesive strips completely unless your health care provider tells you to do that.  Check your incision areas every day for signs of infection. It may be helpful to use a handheld mirror to do this. Check for: ? Redness, swelling, or pain that has gotten worse. ? More fluid or blood. ? Warmth. ? Pus or a bad smell.  If you were sent home with a drain, take care of it as told by your health care provider. Lifestyle  Do not douche or use tampons until your health care provider approves.  Do not have sex until your health care provider approves. Tell your health care provider if you have pain or numbness when you return to sexual activity.  Wear cotton underwear and comfortable, loose-fitting clothing.   Medicines  Take over-the-counter and prescription medicines only as told by your health care provider.  Ask your health care provider if the medicine prescribed to you: ? Requires you to avoid driving or using heavy machinery. ? Can cause constipation. You may need to take these actions to prevent or treat constipation:  Take over-the-counter or prescription medicines.  Eat foods that are high in fiber, such as beans, whole grains, and fresh fruits and vegetables.  Limit foods that are high in fat and processed sugars, such as fried or sweet foods. General instructions  Do not drive  until your health care provider says that it is safe.  Drink enough fluid to keep your urine pale yellow.  Wear compression stockings as told by your health care provider. These stockings help to prevent blood clots and reduce swelling in your legs.  Keep all follow-up visits as told by your health care provider. This is important. Contact a health care provider if:  You have any of these problems in the incision area: ? More redness, swelling, or pain around the incision. ? More fluid or blood coming from the incision. ? Pus or a bad smell coming from the incision. ? The incision feels warm to the touch. ? The incision breaks open.  You have a fever.  You have painful or bloody urination.  You feel nauseous or you vomit.  You have diarrhea.  You develop constipation.  You develop a rash.  You feel dizzy or light-headed.  You have pain that does not get better with medicine. Get help right away if you:  Faint.  Have leg or chest pain.  Have abdominal pain.  Have shortness of breath. Summary  After the procedure, it is common to have some pain, numbness, or swelling in the vaginal area. It is also common to have some bloody vaginal discharge.  Do not lift, push, or pull more than 5 lb (2.3 kg), or the limit that you are told, or engage in activities that take a lot of effort until your health care provider approves.  Follow instructions from your health care provider about how to take care of your incision.  Keep all follow-up visits as told by your health care provider. This is important. This information is not intended to replace advice given to you by your health care provider. Make sure you discuss any questions you have with your health care provider. Document Revised: 11/05/2018 Document Reviewed: 11/06/2018 Elsevier Patient Education  2020 [employer].

## 2019-12-24 NOTE — Anesthesia Preprocedure Evaluation (Addendum)
Anesthesia Evaluation  Patient identified by MRN, date of birth, ID band Patient awake    Reviewed: Allergy & Precautions, NPO status , Patient's Chart, lab work & pertinent test results  Airway Mallampati: II  TM Distance: >3 FB Neck ROM: Full    Dental  (+) Teeth Intact   Pulmonary asthma , Current SmokerPatient did not abstain from smoking.,    Pulmonary exam normal breath sounds clear to auscultation       Cardiovascular Exercise Tolerance: Good + dysrhythmias (tachycardia)  Rhythm:Regular Rate:Normal  22-Dec-2019 13:41:48 [facility]-300 ROUTINE RECORD Normal sinus rhythm with sinus arrhythmia Rightward axis Borderline ECG Since last tracing rate slower Confirmed by Rinaldo Cloud ([id number]) on 12/22/2019 10:25:57 PM   Neuro/Psych PSYCHIATRIC DISORDERS Anxiety Depression PTSD   GI/Hepatic (+)     substance abuse  marijuana use,   Endo/Other    Renal/GU Renal disease     Musculoskeletal   Abdominal   Peds  Hematology   Anesthesia Other Findings   Reproductive/Obstetrics                            Anesthesia Physical Anesthesia Plan  ASA: II  Anesthesia Plan: General   Post-op Pain Management:    Induction: Intravenous  PONV Risk Score and Plan: 4 or greater and Ondansetron, Dexamethasone and Midazolam  Airway Management Planned: LMA  Additional Equipment:   Intra-op Plan:   Post-operative Plan:   Informed Consent: I have reviewed the patients History and Physical, chart, labs and discussed the procedure including the risks, benefits and alternatives for the proposed anesthesia with the patient or authorized representative who has indicated his/her understanding and acceptance.     Dental advisory given  Plan Discussed with: CRNA and Surgeon  Anesthesia Plan Comments:         Anesthesia Quick Evaluation

## 2019-12-24 NOTE — Transfer of Care (Signed)
Immediate Anesthesia Transfer of Care Note  Patient: Tracie Aguilar  Procedure(s) Performed: REMOVAL OF LEFT PERICLITORAL LESION (Left )  Patient Location: PACU  Anesthesia Type:General  Level of Consciousness: awake  Airway & Oxygen Therapy: Patient Spontanous Breathing and Patient connected to face mask oxygen  Post-op Assessment: Report given to RN and Post -op Vital signs reviewed and stable  Post vital signs: Reviewed and stable  Last Vitals:  Vitals Value Taken Time  BP 102/46 12/24/19 1035  Temp 36.6 C 12/24/19 1035  Pulse 71 12/24/19 1041  Resp 17 12/24/19 1041  SpO2 100 % 12/24/19 1041  Vitals shown include unvalidated device data.  Last Pain:  Vitals:   12/24/19 1035  TempSrc:   PainSc: (P) 0-No pain      Patients Stated Pain Goal: (P) 6 (12/24/19 1035)  Complications: No apparent anesthesia complications

## 2019-12-26 LAB — SURGICAL PATHOLOGY

## 2020-01-05 ENCOUNTER — Other Ambulatory Visit: Payer: Self-pay

## 2020-01-05 ENCOUNTER — Ambulatory Visit (INDEPENDENT_AMBULATORY_CARE_PROVIDER_SITE_OTHER): Payer: Self-pay | Admitting: Obstetrics & Gynecology

## 2020-01-05 ENCOUNTER — Encounter: Payer: Self-pay | Admitting: Obstetrics & Gynecology

## 2020-01-05 VITALS — BP 102/65 | HR 89 | Ht 63.0 in | Wt 206.0 lb

## 2020-01-05 DIAGNOSIS — Z9889 Other specified postprocedural states: Secondary | ICD-10-CM

## 2020-01-05 MED ORDER — SILVER SULFADIAZINE 1 % EX CREA: TOPICAL_CREAM | CUTANEOUS | 11 refills | Status: DC

## 2020-01-05 NOTE — Progress Notes (Signed)
  HPI: Patient returns for routine postoperative follow-up having undergone excison of periclitoral/mons pubis cyst on 12/24/19.  The patient's immediate postoperative recovery has been unremarkable. Since hospital discharge the patient reports no problems.   Current Outpatient Medications: .  Ascorbic Acid (VITAMIN C PO), Take 3 tablets by mouth daily., Disp: , Rfl:  .  ibuprofen (ADVIL) 800 MG tablet, Take 1 tablet (800 mg total) by mouth every 8 (eight) hours as needed., Disp: 30 tablet, Rfl: 1 .  Multiple Vitamins-Minerals (HAIR SKIN AND NAILS FORMULA) TABS, Take 1 tablet by mouth daily., Disp: , Rfl:  .  albuterol (VENTOLIN HFA) 108 (90 Base) MCG/ACT inhaler, Inhale 2 puffs into the lungs every 6 (six) hours as needed for wheezing or shortness of breath. (Patient not taking: Reported on 01/05/2020), Disp: 18 g, Rfl: 1 .  Cyanocobalamin (VITAMIN B 12 PO), Take 2,500 mcg by mouth daily. , Disp: , Rfl:  .  doxycycline (VIBRA-TABS) 100 MG tablet, Take 1 tablet (100 mg total) by mouth 2 (two) times daily. (Patient not taking: Reported on 01/05/2020), Disp: 20 tablet, Rfl: 0 .  HYDROcodone-acetaminophen (NORCO/VICODIN) 5-325 MG tablet, Take 1 tablet by mouth every 6 (six) hours as needed. (Patient not taking: Reported on 01/05/2020), Disp: 15 tablet, Rfl: 0 .  silver sulfADIAZINE (SILVADENE) 1 % cream, Apply twice daily, Disp: 50 g, Rfl: 11  No current facility-administered medications for this visit.    Blood pressure 102/65, pulse 89, height 5\' 3"  (1.6 m), weight 206 lb (93.4 kg), last menstrual period 12/15/2019.  Physical Exam: Incision area clean dry intact Healing very well  Diagnostic Tests:   Pathology: benign  Impression: S/p excision of a peri clitoral cyst  Plan:   Follow up: 5  weeks  12/17/2019, MD

## 2020-01-07 ENCOUNTER — Ambulatory Visit: Payer: Medicaid Other | Admitting: Physician Assistant

## 2020-01-07 DIAGNOSIS — F419 Anxiety disorder, unspecified: Secondary | ICD-10-CM

## 2020-01-07 DIAGNOSIS — F17200 Nicotine dependence, unspecified, uncomplicated: Secondary | ICD-10-CM

## 2020-01-07 NOTE — Progress Notes (Signed)
   LMP 12/15/2019    Subjective:    Patient ID: Tracie Aguilar, female    DOB: 04/03/1986, 34 y.o.   MRN: [id number]  HPI: Tracie Aguilar is a 34 y.o. female presenting on 01/07/2020 for No chief complaint on file.   HPI   This is a telemedicine appointment through [employer] due to coronavirus pandemic.  I connected with  [name] on 01/07/20 by a video enabled telemedicine application and verified that I am speaking with the correct person using two identifiers.   I discussed the limitations of evaluation and management by telemedicine. The patient expressed understanding and agreed to proceed.  Pt is at home.  Provider is at office.   Pt is 33yoF who was seen last month to establish care.  She had surgical procedure with gyn and says that is feeling better.  She says her Breathing is better and she is not needing the inhaler.  She hasn't contacted [facility]; she says her  Anxiety is bad lately due to being so busy.   She says her Bowels are moving well now and her Stomach and abdominal pain is improved.         Relevant past medical, surgical, family and social history reviewed and updated as indicated. Interim medical history since our last visit reviewed. Allergies and medications reviewed and updated.    Current Outpatient Medications:  [name]  Multiple Vitamins-Minerals (HAIR SKIN AND NAILS FORMULA) TABS, Take 1 tablet by mouth daily., Disp: , Rfl:  .  silver sulfADIAZINE (SILVADENE) 1 % cream, Apply twice daily, Disp: 50 g, Rfl: 11 .  albuterol (VENTOLIN HFA) 108 (90 Base) MCG/ACT inhaler, Inhale 2 puffs into the lungs every 6 (six) hours as needed for wheezing or shortness of breath. (Patient not taking: Reported on 01/05/2020), Disp: 18 g, Rfl: 1 .  Ascorbic Acid (VITAMIN C PO), Take 3 tablets by mouth daily., Disp: , Rfl:  .  Cyanocobalamin (VITAMIN B 12 PO), Take 2,500 mcg by mouth daily. , Disp: , Rfl:      Review of Systems  Per HPI unless specifically  indicated above     Objective:    LMP 12/15/2019   Wt Readings from Last 3 Encounters:  01/05/20 206 lb (93.4 kg)  12/22/19 203 lb (92.1 kg)  12/10/19 205 lb 3.2 oz (93.1 kg)    Physical Exam Constitutional:      General: She is not in acute distress.    Appearance: She is not ill-appearing.  HENT:     Head: Normocephalic and atraumatic.  Pulmonary:     Effort: Pulmonary effort is normal. No respiratory distress.  Neurological:     Mental Status: She is alert and oriented to person, place, and time.  Psychiatric:        Attention and Perception: Attention normal.        Speech: Speech normal.        Behavior: Behavior is cooperative.              Assessment & Plan:    Encounter Diagnoses  Name Primary?  [name] Anxiety Yes  . Tobacco use disorder       -Reviewed labs with pt (lipids, cbc, tsh, cmp, a1c) -encouraged pt to Go to [facility] for her anxiety -will have pt follow up in 3 months.  She is to contact office sooner for any problems

## 2020-01-09 ENCOUNTER — Encounter: Payer: Self-pay | Admitting: Physician Assistant

## 2020-01-27 ENCOUNTER — Ambulatory Visit: Payer: Medicaid Other | Admitting: Physician Assistant

## 2020-01-27 ENCOUNTER — Encounter: Payer: Self-pay | Admitting: Physician Assistant

## 2020-01-27 ENCOUNTER — Other Ambulatory Visit: Payer: Self-pay

## 2020-01-27 VITALS — BP 104/68 | HR 88 | Temp 98.6°F

## 2020-01-27 DIAGNOSIS — S39012A Strain of muscle, fascia and tendon of lower back, initial encounter: Secondary | ICD-10-CM

## 2020-01-27 DIAGNOSIS — R1084 Generalized abdominal pain: Secondary | ICD-10-CM

## 2020-01-27 LAB — POCT URINALYSIS DIPSTICK
Bilirubin, UA: NEGATIVE
Blood, UA: NEGATIVE
Glucose, UA: NEGATIVE
Ketones, UA: NEGATIVE
Leukocytes, UA: NEGATIVE
Nitrite, UA: NEGATIVE
Protein, UA: NEGATIVE
Spec Grav, UA: 1.015 (ref 1.010–1.025)
Urobilinogen, UA: 0.2 E.U./dL
pH, UA: 7 (ref 5.0–8.0)

## 2020-01-27 MED ORDER — CYCLOBENZAPRINE HCL 10 MG PO TABS: 10.0000 mg | ORAL_TABLET | Freq: Three times a day (TID) | ORAL | 0 refills | Status: DC | PRN

## 2020-01-27 MED ORDER — DICLOFENAC SODIUM 75 MG PO TBEC: 75.0000 mg | DELAYED_RELEASE_TABLET | Freq: Two times a day (BID) | ORAL | 0 refills | Status: DC

## 2020-01-27 NOTE — Patient Instructions (Signed)
Lumbar Strain A lumbar strain, which is sometimes called a low-back strain, is a stretch or tear in a muscle or the strong cords of tissue that attach muscle to bone (tendons) in the lower back (lumbar spine). This type of injury occurs when muscles or tendons are torn or are stretched beyond their limits. Lumbar strains can range from mild to severe. Mild strains may involve stretching a muscle or tendon without tearing it. These may heal in 1-2 weeks. More severe strains involve tearing of muscle fibers or tendons. These will cause more pain and may take 6-8 weeks to heal. What are the causes? This condition may be caused by:  Trauma, such as a fall or a hit to the body.  Twisting or overstretching the back. This may result from doing activities that need a lot of energy, such as lifting heavy objects. What increases the risk? This injury is more common in:  Athletes.  People with obesity.  People who do repeated lifting, bending, or other movements that involve their back. What are the signs or symptoms? Symptoms of this condition may include:  Sharp or dull pain in the lower back that does not go away. The pain may extend to the buttocks.  Stiffness or limited range of motion.  Sudden muscle tightening (spasms). How is this diagnosed? This condition may be diagnosed based on:  Your symptoms.  Your medical history.  A physical exam.  Imaging tests, such as: ? X-rays. ? MRI. How is this treated? Treatment for this condition may include:  Rest.  Applying heat and cold to the affected area.  Over-the-counter medicines to help relieve pain and inflammation, such as NSAIDs.  Prescription pain medicine and muscle relaxants may be needed for a short time.  Physical therapy. Follow these instructions at home: Managing pain, stiffness, and swelling      If directed, put ice on the injured area during the first 24 hours after your injury. ? Put ice in a plastic  bag. ? Place a towel between your skin and the bag. ? Leave the ice on for 20 minutes, 2-3 times a day.  If directed, apply heat to the affected area as often as told by your health care provider. Use the heat source that your health care provider recommends, such as a moist heat pack or a heating pad. ? Place a towel between your skin and the heat source. ? Leave the heat on for 20-30 minutes. ? Remove the heat if your skin turns bright red. This is especially important if you are unable to feel pain, heat, or cold. You may have a greater risk of getting burned. Activity  Rest and return to your normal activities as told by your health care provider. Ask your health care provider what activities are safe for you.  Do exercises as told by your health care provider. Medicines  Take over-the-counter and prescription medicines only as told by your health care provider.  Ask your health care provider if the medicine prescribed to you: ? Requires you to avoid driving or using heavy machinery. ? Can cause constipation. You may need to take these actions to prevent or treat constipation:  Drink enough fluid to keep your urine pale yellow.  Take over-the-counter or prescription medicines.  Eat foods that are high in fiber, such as beans, whole grains, and fresh fruits and vegetables.  Limit foods that are high in fat and processed sugars, such as fried or sweet foods. Injury prevention To prevent   a future low-back injury:  Always warm up properly before physical activity or sports.  Cool down and stretch after being active.  Use correct form when playing sports and lifting heavy objects. Bend your knees before you lift heavy objects.  Use good posture when sitting and standing.  Stay physically fit and keep a healthy weight. ? Do at least 150 minutes of moderate-intensity exercise each week, such as brisk walking or water aerobics. ? Do strength exercises at least 2 times each  week.  General instructions  Do not use any products that contain nicotine or tobacco, such as cigarettes, e-cigarettes, and chewing tobacco. If you need help quitting, ask your health care provider.  Keep all follow-up visits as told by your health care provider. This is important. Contact a health care provider if:  Your back pain does not improve after 6 weeks of treatment.  Your symptoms get worse. Get help right away if:  Your back pain is severe.  You are unable to stand or walk.  You develop pain in your legs.  You develop weakness in your buttocks or legs.  You have difficulty controlling when you urinate or when you have a bowel movement. ? You have frequent, painful, or bloody urination. ? You have a temperature over 101.0F (38.3C) Summary  A lumbar strain, which is sometimes called a low-back strain, is a stretch or tear in a muscle or the strong cords of tissue that attach muscle to bone (tendons) in the lower back (lumbar spine).  This type of injury occurs when muscles or tendons are torn or are stretched beyond their limits.  Rest and return to your normal activities as told by your health care provider. If directed, apply heat and ice to the affected area as often as told by your health care provider.  Take over-the-counter and prescription medicines only as told by your health care provider.  Contact a health care provider if you have new or worsening symptoms. This information is not intended to replace advice given to you by your health care provider. Make sure you discuss any questions you have with your health care provider. Document Revised: 08/08/2018 Document Reviewed: 08/08/2018 Elsevier Patient Education  2020 [employer]  

## 2020-01-27 NOTE — Progress Notes (Signed)
BP 104/68   Pulse 88   Temp 98.6 F (37 C)   LMP  (LMP Unknown)   SpO2 96%    Subjective:    Patient ID: Tracie Aguilar, female    DOB: 11-Aug-1986, 34 y.o.   MRN: [id number]  HPI: Tracie Aguilar is a 34 y.o. female presenting on 01/27/2020 for No chief complaint on file.   HPI   Pt had a negative covid 19 screening questionnaire.    Pt is 33yoF who made appt for today due to back and abd pain.  She says it Started day before yesterday/sunday.  She says the pain is Keeping her awake.     She is eating normally.  She says she has A little bit of nausea she thinks is due to pain.  No emesis.  No diarhhea.  She Had BM this a.m. and it was normal.    She tried IBU for pain and she thinks it helped her abdomen but not her back.    She says on Saturday she did nothing out of the normal.  She was Just walking around doing normal things.  No lifting.  She states isssues with her back in the past.    She recently had surgery for abscess in genitals and she says its good/ all better.  She says her Pain is in the lower back.  Pain worse at night.  She Feels better when she is up and around.  No pain in legs or arms. No numbness or tingling or weakness.    Relevant past medical, surgical, family and social history reviewed and updated as indicated. Interim medical history since our last visit reviewed. Allergies and medications reviewed and updated.   Current Outpatient Medications:  .  Ascorbic Acid (VITAMIN C PO), Take 3 tablets by mouth daily., Disp: , Rfl:  .  Multiple Vitamins-Minerals (HAIR SKIN AND NAILS FORMULA) TABS, Take 1 tablet by mouth daily., Disp: , Rfl:  .  silver sulfADIAZINE (SILVADENE) 1 % cream, Apply twice daily, Disp: 50 g, Rfl: 11 .  albuterol (VENTOLIN HFA) 108 (90 Base) MCG/ACT inhaler, Inhale 2 puffs into the lungs every 6 (six) hours as needed for wheezing or shortness of breath. (Patient not taking: Reported on 01/05/2020), Disp: 18 g, Rfl: 1 .   Cyanocobalamin (VITAMIN B 12 PO), Take 2,500 mcg by mouth daily. , Disp: , Rfl:    Review of Systems  Per HPI unless specifically indicated above     Objective:    BP 104/68   Pulse 88   Temp 98.6 F (37 C)   LMP  (LMP Unknown)   SpO2 96%   Wt Readings from Last 3 Encounters:  01/05/20 206 lb (93.4 kg)  12/22/19 203 lb (92.1 kg)  12/10/19 205 lb 3.2 oz (93.1 kg)    Physical Exam Vitals reviewed.  Constitutional:      General: She is not in acute distress.    Appearance: Normal appearance. She is well-developed. She is obese. She is not ill-appearing or toxic-appearing.  HENT:     Head: Normocephalic and atraumatic.  Cardiovascular:     Rate and Rhythm: Normal rate and regular rhythm.  Pulmonary:     Effort: Pulmonary effort is normal.     Breath sounds: Normal breath sounds.  Abdominal:     General: Bowel sounds are normal.     Palpations: Abdomen is soft. There is no mass.     Tenderness: There is no abdominal tenderness.  Musculoskeletal:     Cervical back: Neck supple.     Lumbar back: Normal. No swelling, deformity, tenderness or bony tenderness. Normal range of motion. Negative right straight leg raise test and negative left straight leg raise test.     Right lower leg: No edema.     Left lower leg: No edema.  Lymphadenopathy:     Cervical: No cervical adenopathy.  Skin:    General: Skin is warm and dry.  Neurological:     Mental Status: She is alert and oriented to person, place, and time.  Psychiatric:        Attention and Perception: Attention normal.        Mood and Affect: Mood normal.        Speech: Speech normal.        Behavior: Behavior normal. Behavior is cooperative.     UA - wnl      Assessment & Plan:    Encounter Diagnoses  Name Primary?  . Back strain, initial encounter Yes  . Generalized abdominal pain      -rx diclofenac and flexeril.  Pt counseled to avoid driving on flexeril due to sedation.  Encouraged ice/heat.  Gave  reading information -pt to follow up next month as scheduled.  She is to contact office sooner prn

## 2020-02-09 ENCOUNTER — Other Ambulatory Visit: Payer: Self-pay

## 2020-02-09 ENCOUNTER — Encounter: Payer: Self-pay | Admitting: Obstetrics & Gynecology

## 2020-02-09 ENCOUNTER — Ambulatory Visit (INDEPENDENT_AMBULATORY_CARE_PROVIDER_SITE_OTHER): Payer: Self-pay | Admitting: Obstetrics & Gynecology

## 2020-02-09 VITALS — BP 115/58 | HR 93 | Ht 63.0 in | Wt 211.0 lb

## 2020-02-09 DIAGNOSIS — Z9889 Other specified postprocedural states: Secondary | ICD-10-CM

## 2020-02-09 NOTE — Progress Notes (Signed)
  HPI: Patient returns for routine postoperative follow-up having undergone removal of peri clitoral infected epidermal inclusion cyst on 12/24/19.  The patient's immediate postoperative recovery has been unremarkable. Since hospital discharge the patient reports no complaints related to surgery.   Current Outpatient Medications: .  albuterol (VENTOLIN HFA) 108 (90 Base) MCG/ACT inhaler, Inhale 2 puffs into the lungs every 6 (six) hours as needed for wheezing or shortness of breath., Disp: 18 g, Rfl: 1 .  cyclobenzaprine (FLEXERIL) 10 MG tablet, Take 1 tablet (10 mg total) by mouth 3 (three) times daily as needed for muscle spasms., Disp: 30 tablet, Rfl: 0 .  diclofenac (VOLTAREN) 75 MG EC tablet, Take 1 tablet (75 mg total) by mouth 2 (two) times daily., Disp: 30 tablet, Rfl: 0 .  Multiple Vitamins-Minerals (HAIR SKIN AND NAILS FORMULA) TABS, Take 1 tablet by mouth daily., Disp: , Rfl:  .  silver sulfADIAZINE (SILVADENE) 1 % cream, Apply twice daily, Disp: 50 g, Rfl: 11  No current facility-administered medications for this visit.    Blood pressure (!) 115/58, pulse 93, height 5\' 3"  (1.6 m), weight 211 lb (95.7 kg), last menstrual period 02/08/2020.  Physical Exam: Healed surgical area no erythema swelling or s/s infection  Diagnostic Tests:   Pathology:   Impression: S/p removal of peri clitoral  Plan: No follow up needed  Follow up: prn    02/10/2020, MD

## 2020-02-23 ENCOUNTER — Emergency Department (HOSPITAL_COMMUNITY)
Admission: EM | Admit: 2020-02-23 | Discharge: 2020-02-23 | Disposition: A | Discharge status: Home / Self Care | Payer: Self-pay | Attending: Emergency Medicine | Admitting: Emergency Medicine

## 2020-02-23 ENCOUNTER — Emergency Department (HOSPITAL_COMMUNITY): Payer: Self-pay

## 2020-02-23 ENCOUNTER — Encounter (HOSPITAL_COMMUNITY): Payer: Self-pay | Admitting: *Deleted

## 2020-02-23 ENCOUNTER — Other Ambulatory Visit: Payer: Self-pay

## 2020-02-23 DIAGNOSIS — E236 Other disorders of pituitary gland: Secondary | ICD-10-CM | POA: Insufficient documentation

## 2020-02-23 DIAGNOSIS — Z79899 Other long term (current) drug therapy: Secondary | ICD-10-CM | POA: Insufficient documentation

## 2020-02-23 DIAGNOSIS — R112 Nausea with vomiting, unspecified: Secondary | ICD-10-CM | POA: Insufficient documentation

## 2020-02-23 DIAGNOSIS — M545 Low back pain, unspecified: Secondary | ICD-10-CM

## 2020-02-23 DIAGNOSIS — F1210 Cannabis abuse, uncomplicated: Secondary | ICD-10-CM | POA: Insufficient documentation

## 2020-02-23 DIAGNOSIS — R42 Dizziness and giddiness: Secondary | ICD-10-CM | POA: Insufficient documentation

## 2020-02-23 DIAGNOSIS — R519 Headache, unspecified: Secondary | ICD-10-CM

## 2020-02-23 DIAGNOSIS — F17210 Nicotine dependence, cigarettes, uncomplicated: Secondary | ICD-10-CM | POA: Insufficient documentation

## 2020-02-23 DIAGNOSIS — H538 Other visual disturbances: Secondary | ICD-10-CM | POA: Insufficient documentation

## 2020-02-23 LAB — URINALYSIS, ROUTINE W REFLEX MICROSCOPIC
Bilirubin Urine: NEGATIVE
Glucose, UA: NEGATIVE mg/dL
Hgb urine dipstick: NEGATIVE
Ketones, ur: NEGATIVE mg/dL
Leukocytes,Ua: NEGATIVE
Nitrite: NEGATIVE
Protein, ur: NEGATIVE mg/dL
Specific Gravity, Urine: 1.005 (ref 1.005–1.030)
pH: 7 (ref 5.0–8.0)

## 2020-02-23 LAB — BASIC METABOLIC PANEL
Anion gap: 10 (ref 5–15)
BUN: 12 mg/dL (ref 6–20)
CO2: 24 mmol/L (ref 22–32)
Calcium: 9.2 mg/dL (ref 8.9–10.3)
Chloride: 102 mmol/L (ref 98–111)
Creatinine, Ser: 0.55 mg/dL (ref 0.44–1.00)
GFR calc Af Amer: >60 mL/min (ref >60)
GFR calc non Af Amer: >60 mL/min (ref >60)
Glucose, Bld: 95 mg/dL (ref 70–99)
Potassium: 3.9 mmol/L (ref 3.5–5.1)
Sodium: 136 mmol/L (ref 135–145)

## 2020-02-23 LAB — CBC WITH DIFFERENTIAL/PLATELET
Abs Immature Granulocytes: 0.03 10*3/uL (ref 0.00–0.07)
Basophils Absolute: 0.1 10*3/uL (ref 0.0–0.1)
Basophils Relative: 1 %
Eosinophils Absolute: 0.3 10*3/uL (ref 0.0–0.5)
Eosinophils Relative: 2 %
HCT: 41.1 % (ref 36.0–46.0)
Hemoglobin: 13.7 g/dL (ref 12.0–15.0)
Immature Granulocytes: 0 %
Lymphocytes Relative: 23 %
Lymphs Abs: 2.7 10*3/uL (ref 0.7–4.0)
MCH: 30.2 pg (ref 26.0–34.0)
MCHC: 33.3 g/dL (ref 30.0–36.0)
MCV: 90.5 fL (ref 80.0–100.0)
Monocytes Absolute: 0.6 10*3/uL (ref 0.1–1.0)
Monocytes Relative: 5 %
Neutro Abs: 8.2 10*3/uL — ABNORMAL HIGH (ref 1.7–7.7)
Neutrophils Relative %: 69 %
Platelets: 340 10*3/uL (ref 150–400)
RBC: 4.54 MIL/uL (ref 3.87–5.11)
RDW: 12.7 % (ref 11.5–15.5)
WBC: 11.9 10*3/uL — ABNORMAL HIGH (ref 4.0–10.5)
nRBC: 0 % (ref 0.0–0.2)

## 2020-02-23 LAB — PREGNANCY, URINE: Preg Test, Ur: NEGATIVE

## 2020-02-23 MED ORDER — CYCLOBENZAPRINE HCL 10 MG PO TABS
10.0000 mg | ORAL_TABLET | Freq: Two times a day (BID) | ORAL | 0 refills | Status: DC | PRN
Start: 2020-02-23 — End: 2020-04-29

## 2020-02-23 MED ORDER — PREDNISONE 50 MG PO TABS
60.0000 mg | ORAL_TABLET | Freq: Once | ORAL | Status: AC
  Administered 2020-02-23: 60 mg via ORAL
  Filled 2020-02-23: qty 1

## 2020-02-23 MED ORDER — ONDANSETRON 4 MG PO TBDP: 4.0000 mg | ORAL_TABLET | Freq: Three times a day (TID) | ORAL | 0 refills | Status: DC | PRN

## 2020-02-23 MED ORDER — PREDNISONE 10 MG PO TABS: 20.0000 mg | ORAL_TABLET | Freq: Two times a day (BID) | ORAL | 0 refills | Status: AC

## 2020-02-23 NOTE — ED Notes (Signed)
Pt in er room number 14, pt states that about a month ago she went to her pmd for some chronic back pain, states that she was given an nsaid and flexeril, states that since then she has been having some headaches on her L face, states that the headaches come on suddenly and make her vision blurry and are fairly debilitating, states that she takes the meds for her back and they seem to help, pt denies pain at  This time.

## 2020-02-23 NOTE — ED Provider Notes (Signed)
[facility] EMERGENCY DEPARTMENT Provider Note   CSN: [id number] Arrival date & time: 02/23/20  1118     History Chief Complaint  Patient presents with  . Headache    Tracie Aguilar is a 34 y.o. female with history of anxiety, depression, asthma, nephrolithiasis, PTSD presenting for evaluation of acute onset, persistent low back pain for 4 weeks and left-sided headaches intermittently for 3 weeks.  Denies bending lifting or twisting injury or trauma to the back.  She was seen and evaluated by her PCP on 01/27/2020 for her back pain and was discharged with Flexeril and diclofenac which she reports has not been helpful for her back pain.  The pain is dull, localized to the lower lumbar region and does not radiate.  It improves with sitting upright, worsens laying flat.  It is worse at night when she is lying flat and trying to sleep.  Denies bowel or bladder incontinence, saddle anesthesia, fevers or history of IV drug use.   Approximately 1 week after she was seen by her PCP she began developing sharp left-sided headaches that come on suddenly, sometimes multiple times a day.  It is associated with photophobia, tearing of the left eye, sometimes blurred vision of the left eye, nausea and vomiting at times, and feeling flushed and feverish.  They will improve after taking diclofenac and Flexeril.  She states when the headaches come on she will want to lay down and rest.  She denies neck pain, fevers, recent travel or surgeries, or any sick contacts.  She states that these headaches are different from headaches she has experienced previously.  Denies head trauma.  Denies headache at this time.  The history is provided by the patient.       Past Medical History:  Diagnosis Date  . Anxiety   . Asthma    childhood asthma  . Depression   . Dysrhythmia   . Kidney stones 2007   passed x2  . Post traumatic stress disorder    prior medications   . Tachycardia     Patient Active Problem  List   Diagnosis Date Noted  . PTSD (post-traumatic stress disorder) 06/03/2017  . 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 Depression 07/21/2015  . Encounter for smoking cessation counseling 07/21/2015  . Health care maintenance 07/21/2015    Past Surgical History:  Procedure Laterality Date  . EXCISION VAGINAL CYST Left 12/24/2019   Procedure: REMOVAL OF LEFT PERICLITORAL LESION;  Surgeon: Lazaro Arms, MD;  Location: [facility];  Service: Gynecology;  Laterality: Left;  . TUBAL LIGATION  2013     OB History    Gravida  5   Para  3   Term      Preterm      AB      Living        SAB      TAB      Ectopic      Multiple      Live Births              Family History  Problem Relation Age of Onset  . COPD Mother   . Alcohol abuse Mother   . Arthritis Mother        OA  . Hypertension Mother   . Alcohol abuse Father   . Hypertension Father   . Diabetes Father   . Lung cancer Maternal Grandmother   . Stroke Maternal Grandfather   .  Hypertension Maternal Grandfather   . Diabetes Maternal Grandfather   . Stroke Paternal Grandfather   . Breast cancer Cousin   . Diabetes Cousin     Social History   Tobacco Use  . Smoking status: Current Every Day Smoker    Packs/day: 1.50    Years: 17.00    Pack years: 25.50    Types: Cigarettes    Start date: 10/23/2001  . Smokeless tobacco: Never Used  Substance Use Topics  . Alcohol use: Yes    Alcohol/week: 0.0 standard drinks    Comment: occassinal  . Drug use: Yes    Types: Marijuana    Home Medications Prior to Admission medications   Medication Sig Start Date End Date Taking? Authorizing Provider  albuterol (VENTOLIN HFA) 108 (90 Base) MCG/ACT inhaler Inhale 2 puffs into the lungs every 6 (six) hours as needed for wheezing or shortness of breath. 12/10/19  Yes Jacquelin Hawking, PA-C  Multiple Vitamins-Minerals (HAIR SKIN AND NAILS FORMULA) TABS Take 1 tablet  by mouth daily.   Yes [provider]  cyclobenzaprine (FLEXERIL) 10 MG tablet Take 1 tablet (10 mg total) by mouth 2 (two) times daily as needed for muscle spasms. 02/23/20   Charly Holcomb A, PA-C  ondansetron (ZOFRAN ODT) 4 MG disintegrating tablet Take 1 tablet (4 mg total) by mouth every 8 (eight) hours as needed for nausea or vomiting. 02/23/20   Husam Hohn A, PA-C  predniSONE (DELTASONE) 10 MG tablet Take 2 tablets (20 mg total) by mouth 2 (two) times daily with a meal for 5 days. 02/23/20 02/28/20  Michela Pitcher A, PA-C    Allergies    Sulfa antibiotics  Review of Systems   Review of Systems  Constitutional: Negative for chills and fever.  Eyes: Positive for photophobia and visual disturbance.  Respiratory: Negative for shortness of breath.   Cardiovascular: Negative for chest pain.  Gastrointestinal: Positive for nausea and vomiting. Negative for abdominal pain.  Musculoskeletal: Positive for back pain.  Neurological: Positive for light-headedness and headaches. Negative for syncope, weakness and numbness.  All other systems reviewed and are negative.   Physical Exam Updated Vital Signs BP 96/81   Pulse 73   Temp 98 F (36.7 C) (Oral)   Resp 18   Ht 5\' 3"  (1.6 m)   Wt 95.3 kg   LMP 02/08/2020 (Exact Date)   SpO2 100%   BMI 37.20 kg/m   Physical Exam Vitals and nursing note reviewed.  Constitutional:      General: She is not in acute distress.    Appearance: She is well-developed.  HENT:     Head: Normocephalic and atraumatic.  Eyes:     General:        Right eye: No discharge.        Left eye: No discharge.     Extraocular Movements: Extraocular movements intact.     Right eye: Normal extraocular motion and no nystagmus.     Left eye: Normal extraocular motion and no nystagmus.     Conjunctiva/sclera: Conjunctivae normal.     Pupils: Pupils are equal, round, and reactive to light.  Neck:     Vascular: No JVD.     Trachea: No tracheal deviation.      Meningeal: Brudzinski's sign and Kernig's sign absent.     Comments: No cervical spine tenderness to palpation, no pain or restriction with range of motion of the cervical spine Cardiovascular:     Rate and Rhythm: Normal rate.  Pulmonary:  Effort: Pulmonary effort is normal.  Abdominal:     General: There is no distension.  Musculoskeletal:        General: No swelling or tenderness. Normal range of motion.     Cervical back: Normal range of motion and neck supple. No rigidity.     Comments: No midline spine tenderness, para spinal muscle tenderness or deformity, crepitus, or step-off noted.  5/5 strength of BUE and BLE major muscle groups.  Normal range of motion of the lumbar spine with no pain with flexion or extension of the lumbar spine  Lymphadenopathy:     Cervical: No cervical adenopathy.  Skin:    General: Skin is warm and dry.     Findings: No erythema.  Neurological:     Mental Status: She is alert and oriented to person, place, and time.     GCS: GCS eye subscore is 4. GCS verbal subscore is 5. GCS motor subscore is 6.     Comments: Mental Status:  Alert, thought content appropriate, able to give a coherent history. Speech fluent without evidence of aphasia. Able to follow 2 step commands without difficulty.  Cranial Nerves:  II:  Peripheral visual fields grossly normal, pupils equal, round, reactive to light III,IV, VI: ptosis not present, extra-ocular motions intact bilaterally  V,VII: smile symmetric, facial light touch sensation equal VIII: hearing grossly normal to voice  X: uvula elevates symmetrically  XI: bilateral shoulder shrug symmetric and strong XII: midline tongue extension without fassiculations Motor:  Normal tone. 5/5 strength of BUE and BLE major muscle groups including strong and equal grip strength and dorsiflexion/plantar flexion Sensory: light touch normal in all extremities. Cerebellar: normal finger-to-nose with bilateral upper extremities,  Romberg sign absent Gait: normal gait and balance. Able to walk on toes and heels with ease.     Psychiatric:        Behavior: Behavior normal.     ED Results / Procedures / Treatments   Labs (all labs ordered are listed, but only abnormal results are displayed) Labs Reviewed  CBC WITH DIFFERENTIAL/PLATELET - Abnormal; Notable for the following components:      Result Value   WBC 11.9 (*)    Neutro Abs 8.2 (*)    All other components within normal limits  BASIC METABOLIC PANEL  URINALYSIS, ROUTINE W REFLEX MICROSCOPIC  PREGNANCY, URINE    EKG None  Radiology CT Head Wo Contrast  Result Date: 02/23/2020 CLINICAL DATA:  Headache, acute, normal neuro exam. Additional history provided: Patient reports headache to left side of head, associated eye watering, dizziness. EXAM: CT HEAD WITHOUT CONTRAST TECHNIQUE: Contiguous axial images were obtained from the base of the skull through the vertex without intravenous contrast. COMPARISON:  No pertinent prior studies available for comparison. FINDINGS: Brain: There is no acute intracranial hemorrhage. No demarcated cortical infarct. No extra-axial fluid collection. No evidence of intracranial mass. No midline shift. Partially empty sella turcica. Vascular: No hyperdense vessel. Skull: Normal. Negative for fracture or focal lesion. Sinuses/Orbits: Visualized orbits show no acute finding. No significant paranasal sinus disease or mastoid effusion at the imaged levels. IMPRESSION: Partially empty sella turcica. This is very commonly an incidental finding, but may be seen in the setting of idiopathic intracranial hypertension. Otherwise, unremarkable head CT without evidence of acute intracranial abnormality. Electronically Signed   By: Jackey Loge DO   On: 02/23/2020 16:59   CT Lumbar Spine Wo Contrast  Result Date: 02/23/2020 CLINICAL DATA:  Central low back pain for 1 month  with headaches. EXAM: CT LUMBAR SPINE WITHOUT CONTRAST TECHNIQUE:  Multidetector CT imaging of the lumbar spine was performed without intravenous contrast administration. Multiplanar CT image reconstructions were also generated. COMPARISON:  Lumbar spine radiographs 02/14/2016. FINDINGS: Segmentation: There are 5 lumbar type vertebral bodies. Alignment: Normal. Vertebrae: No evidence of acute fracture or traumatic subluxation. Endplate sclerosis and osteophytes are present at L5-S1. Paraspinal and other soft tissues: No acute paraspinal findings. There are nonobstructing bilateral renal calculi. No evidence of ureteral calculus or hydronephrosis. Disc levels: From T12-L1 through L4-5, the disc height is preserved, and there is no evidence of disc herniation or significant spinal stenosis. L5-S1: Mild loss of disc height with disc bulging, endplate sclerosis and osteophytes. Mild bilateral facet hypertrophy. Mild osseous inferior foraminal narrowing on the left without definite L5 nerve root encroachment. IMPRESSION: 1. No acute findings or explanation for the patient's symptoms. 2. Mild spondylosis at L5-S1 with mild osseous inferior foraminal narrowing on the left. No definite L5 nerve root encroachment. 3. Nonobstructing bilateral renal calculi. Electronically Signed   By: Richardean Sale M.D.   On: 02/23/2020 16:56    Procedures Procedures (including critical care time)  Medications Ordered in ED Medications  predniSONE (DELTASONE) tablet 60 mg (60 mg Oral Given 02/23/20 1820)    ED Course  I have reviewed the triage vital signs and the nursing notes.  Pertinent labs & imaging results that were available during my care of the patient were reviewed by me and considered in my medical decision making (see chart for details).    MDM Rules/Calculators/A&P                      Patient presenting for evaluation of low back pain for 1 month, left-sided headaches for 3 weeks.  She is afebrile, vital signs are stable.  She is nontoxic in appearance.  She is  neurovascularly intact.  She has a normal neurologic examination with no focal neurologic deficits.  With regards to her back pain she has no midline spine tenderness, no red flag signs concerning for cauda equina or spinal abscess.  She has normal range of motion of the lumbar spine.  Her pain is reproducible with position changes but I have difficulty reproducing it on palpation.  A CT scan of the lumbar spine was obtained which shows no acute findings but does show mild spondylosis at L5-S1 with mild osseous inferior foraminal narrowing on the left which could potentially explain some of her back pain.  She also has nonobstructing bilateral renal calculi but she has no urinary symptoms and her UA today shows no evidence of UTI or ureterolithiasis.  Doubt acute surgical abdominal pathology and abdomen is soft and nontender.  Doubt AAA or dissection.   With regards to her headaches, she has a normal neurologic examination.  She has no fever or nuchal rigidity and I doubt meningitis specifically doubt bacterial meningitis given the duration of her symptoms.  Her headaches sound very consistent with cluster headaches.  She was asymptomatic the entire time that she was in the ED and did not have any headaches.  CT scan was obtained which shows partially empty sella turcica.  I suspect that this is more so an incidental finding, less likely to be indicative of idiopathic intracranial hypertension given the description of her headaches.  Low concern for subarachnoid hemorrhage or other intracranial hemorrhage.  Doubt CVA.  Doubt temporal arteritis.   On reevaluation patient is resting comfortably in no apparent distress.  We will discontinue her diclofenac and start her on a course of prednisone for her back pain.  She will continue the Flexeril, advised of medication side effects.  Will give her Zofran for the nausea and vomiting that she is experienced with her headaches.  She will follow up with her PCP and  neurology and neurosurgery on an outpatient basis for management of her headaches and her back pain.  Discussed strict ED return precautions. Patient verbalized understanding of and agreement with plan and is safe for discharge home at this time.    Final Clinical Impression(s) / ED Diagnoses Final diagnoses:  Acute bilateral low back pain without sciatica  Left-sided headache  Empty sella turcica (HCC)    Rx / DC Orders ED Discharge Orders         Ordered    predniSONE (DELTASONE) 10 MG tablet  2 times daily with meals     02/23/20 1812    cyclobenzaprine (FLEXERIL) 10 MG tablet  2 times daily PRN     02/23/20 1812    ondansetron (ZOFRAN ODT) 4 MG disintegrating tablet  Every 8 hours PRN     02/23/20 1815           Jeanie SewerFawze, Aracely Rickett A, PA-C 02/23/20 1828    Pollyann SavoySheldon, Charles B, MD 02/23/20 2214

## 2020-02-23 NOTE — Discharge Instructions (Signed)
1. Medications: Start taking steroid (prednisone) as prescribed beginning tomorrow as you received the first dose in the emergency department today.  Do not take diclofenac, ibuprofen, Advil, Motrin, or Aleve while taking this medication.  You can take 1 to 2 tablets of Tylenol every 6 hours additionally however.  When you are done taking the steroid taper you can alternate 600 mg of ibuprofen and [phone] mg of Tylenol every 3 hours as needed for pain. Do not exceed 4000 mg of Tylenol daily.  Take ibuprofen with food to avoid upset stomach issues.    You can take Flexeril as needed for muscle spasm up to twice daily but do not drive, drink alcohol, or operate heavy machinery while taking this medicine because it may make you drowsy.  I typically recommend taking this medicine only at night when you are going to sleep.  You can also cut these tablets in half if they make you feel very drowsy.    You can apply pain patches to areas of pain which can be purchased at any pharmacy over-the-counter such as Salonpas, icy hot, or lidocaine patches.  2. Treatment: rest, drink plenty of fluids, gentle stretching as discussed (see attached or you can YouTube search low back physical therapy exercises), alternate ice and heat (or stick with whichever feels best) 20 minutes on 20 minutes off.  3. Follow Up: Your work-up today was reassuring.  Your CT scan did show what is called empty sella turcica.  This likely is not contributing to your symptoms although people with empty sella can have headaches.  However the headaches you are describing to me sound much more like cluster headaches which are managed differently.  It will be important to follow-up with your PCP or neurologist for reevaluation of your headaches.  Please followup with your primary doctor in 3-7 days for discussion of your diagnoses and further evaluation after today's visit; I have also provided information for a neurosurgeon you can follow-up with;  Return to the ER for worsening back pain, difficulty walking, loss of bowel or bladder control or other concerning symptoms

## 2020-02-23 NOTE — ED Triage Notes (Signed)
Pt c/o headache to left side of the head; pt states the pain makes her eyes water and she has dizziness with the pain; pt states she has to apply pressure to the area of pain and lie down.  She has been having these symptoms x 3 weeks; pt states she took diclofenac and flexeril at 11am today

## 2020-03-05 ENCOUNTER — Emergency Department (HOSPITAL_COMMUNITY)
Admission: EM | Admit: 2020-03-05 | Discharge: 2020-03-06 | Disposition: A | Discharge status: Home / Self Care | Payer: Self-pay | Attending: Emergency Medicine | Admitting: Emergency Medicine

## 2020-03-05 ENCOUNTER — Encounter (HOSPITAL_COMMUNITY): Payer: Self-pay | Admitting: Emergency Medicine

## 2020-03-05 ENCOUNTER — Emergency Department (HOSPITAL_COMMUNITY): Payer: Self-pay

## 2020-03-05 ENCOUNTER — Other Ambulatory Visit: Payer: Self-pay

## 2020-03-05 DIAGNOSIS — F17210 Nicotine dependence, cigarettes, uncomplicated: Secondary | ICD-10-CM | POA: Insufficient documentation

## 2020-03-05 DIAGNOSIS — J45909 Unspecified asthma, uncomplicated: Secondary | ICD-10-CM | POA: Insufficient documentation

## 2020-03-05 DIAGNOSIS — N132 Hydronephrosis with renal and ureteral calculous obstruction: Secondary | ICD-10-CM | POA: Insufficient documentation

## 2020-03-05 DIAGNOSIS — N201 Calculus of ureter: Secondary | ICD-10-CM | POA: Insufficient documentation

## 2020-03-05 DIAGNOSIS — Z79899 Other long term (current) drug therapy: Secondary | ICD-10-CM | POA: Insufficient documentation

## 2020-03-05 LAB — CBC WITH DIFFERENTIAL/PLATELET
Abs Immature Granulocytes: 0.05 10*3/uL (ref 0.00–0.07)
Basophils Absolute: 0.1 10*3/uL (ref 0.0–0.1)
Basophils Relative: 1 %
Eosinophils Absolute: 0.4 10*3/uL (ref 0.0–0.5)
Eosinophils Relative: 3 %
HCT: 38.2 % (ref 36.0–46.0)
Hemoglobin: 12.6 g/dL (ref 12.0–15.0)
Immature Granulocytes: 0 %
Lymphocytes Relative: 20 %
Lymphs Abs: 2.5 10*3/uL (ref 0.7–4.0)
MCH: 29.7 pg (ref 26.0–34.0)
MCHC: 33 g/dL (ref 30.0–36.0)
MCV: 90.1 fL (ref 80.0–100.0)
Monocytes Absolute: 0.8 10*3/uL (ref 0.1–1.0)
Monocytes Relative: 6 %
Neutro Abs: 8.9 10*3/uL — ABNORMAL HIGH (ref 1.7–7.7)
Neutrophils Relative %: 70 %
Platelets: 305 10*3/uL (ref 150–400)
RBC: 4.24 MIL/uL (ref 3.87–5.11)
RDW: 12.8 % (ref 11.5–15.5)
WBC: 12.8 10*3/uL — ABNORMAL HIGH (ref 4.0–10.5)
nRBC: 0 % (ref 0.0–0.2)

## 2020-03-05 LAB — URINALYSIS, ROUTINE W REFLEX MICROSCOPIC
Bacteria, UA: NONE SEEN
Bilirubin Urine: NEGATIVE
Glucose, UA: NEGATIVE mg/dL
Ketones, ur: NEGATIVE mg/dL
Leukocytes,Ua: NEGATIVE
Nitrite: NEGATIVE
Protein, ur: 100 mg/dL — AB
RBC / HPF: >50 RBC/hpf — ABNORMAL HIGH (ref 0–5)
Specific Gravity, Urine: 1.019 (ref 1.005–1.030)
pH: 6 (ref 5.0–8.0)

## 2020-03-05 LAB — COMPREHENSIVE METABOLIC PANEL
ALT: 41 U/L (ref 0–44)
AST: 31 U/L (ref 15–41)
Albumin: 3.3 g/dL — ABNORMAL LOW (ref 3.5–5.0)
Alkaline Phosphatase: 78 U/L (ref 38–126)
Anion gap: 9 (ref 5–15)
BUN: 15 mg/dL (ref 6–20)
CO2: 24 mmol/L (ref 22–32)
Calcium: 8.4 mg/dL — ABNORMAL LOW (ref 8.9–10.3)
Chloride: 103 mmol/L (ref 98–111)
Creatinine, Ser: 0.68 mg/dL (ref 0.44–1.00)
GFR calc Af Amer: >60 mL/min (ref >60)
GFR calc non Af Amer: >60 mL/min (ref >60)
Glucose, Bld: 108 mg/dL — ABNORMAL HIGH (ref 70–99)
Potassium: 3.7 mmol/L (ref 3.5–5.1)
Sodium: 136 mmol/L (ref 135–145)
Total Bilirubin: 0.2 mg/dL — ABNORMAL LOW (ref 0.3–1.2)
Total Protein: 6.3 g/dL — ABNORMAL LOW (ref 6.5–8.1)

## 2020-03-05 LAB — LIPASE, BLOOD: Lipase: 27 U/L (ref 11–51)

## 2020-03-05 LAB — POC URINE PREG, ED: Preg Test, Ur: NEGATIVE

## 2020-03-05 MED ORDER — MORPHINE SULFATE (PF) 4 MG/ML IV SOLN
4.0000 mg | Freq: Once | INTRAVENOUS | Status: AC
  Administered 2020-03-05: 4 mg via INTRAVENOUS
  Filled 2020-03-05: qty 1

## 2020-03-05 MED ORDER — ONDANSETRON HCL 4 MG/2ML IJ SOLN
4.0000 mg | Freq: Once | INTRAMUSCULAR | Status: AC
  Administered 2020-03-05: 4 mg via INTRAVENOUS
  Filled 2020-03-05: qty 2

## 2020-03-05 MED ORDER — KETOROLAC TROMETHAMINE 30 MG/ML IJ SOLN
30.0000 mg | Freq: Once | INTRAMUSCULAR | Status: AC
  Administered 2020-03-05: 30 mg via INTRAVENOUS
  Filled 2020-03-05: qty 1

## 2020-03-05 MED ORDER — IOHEXOL 300 MG/ML  SOLN
100.0000 mL | Freq: Once | INTRAMUSCULAR | Status: AC | PRN
  Administered 2020-03-05: 100 mL via INTRAVENOUS

## 2020-03-05 NOTE — ED Notes (Signed)
Pt w hx of kidney stone in 2008  Noticed L back pain then noted hematuria 4 days ago   Pain is unrelieved   Here for eval

## 2020-03-05 NOTE — ED Triage Notes (Signed)
Pt c/o abdominal pain, back pain, and hematuria x 4 days.

## 2020-03-05 NOTE — ED Provider Notes (Signed)
[facility] EMERGENCY DEPARTMENT Provider Note   CSN: [id number] Arrival date & time: 03/05/20  1931     History Chief Complaint  Patient presents with  . Hematuria    Tracie Aguilar is a 34 y.o. female with history of asthma, anxiety, nephrolithiasis, PTSD presents for evaluation of acute onset, persistent abdominal pain and left flank pain for 4 days.  She notes associated nausea.  For the last 2 days she has been experiencing hematuria but denies dysuria, urgency or frequency.  She has had nausea but no vomiting, fevers, shortness of breath or chest pain.  No diarrhea or constipation.  She reports she initially thought the hematuria was due to menstruating but then realize she is not currently menstruating.  She has been taking ibuprofen but otherwise has not tried anything for her symptoms.  The pain is waxing and waning, no aggravating or alleviating factors noted.  She reports she has not been sexually active in approximately 1 year and has no concern for STDs.  The history is provided by the patient.       Past Medical History:  Diagnosis Date  . Anxiety   . Asthma    childhood asthma  . Depression   . Dysrhythmia   . Kidney stones 2007   passed x2  . Post traumatic stress disorder    prior medications   . Tachycardia     Patient Active Problem List   Diagnosis Date Noted  . PTSD (post-traumatic stress disorder) 06/03/2017  . 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 Depression 07/21/2015  . Encounter for smoking cessation counseling 07/21/2015  . Health care maintenance 07/21/2015    Past Surgical History:  Procedure Laterality Date  . EXCISION VAGINAL CYST Left 12/24/2019   Procedure: REMOVAL OF LEFT PERICLITORAL LESION;  Surgeon: Florian Buff, MD;  Location: [facility];  Service: Gynecology;  Laterality: Left;  . TUBAL LIGATION  2013     OB History    Gravida  5   Para  3   Term      Preterm      AB       Living        SAB      TAB      Ectopic      Multiple      Live Births              Family History  Problem Relation Age of Onset  . COPD Mother   . Alcohol abuse Mother   . Arthritis Mother        OA  . Hypertension Mother   . Alcohol abuse Father   . Hypertension Father   . Diabetes Father   . Lung cancer Maternal Grandmother   . Stroke Maternal Grandfather   . Hypertension Maternal Grandfather   . Diabetes Maternal Grandfather   . Stroke Paternal Grandfather   . Breast cancer Cousin   . Diabetes Cousin     Social History   Tobacco Use  . Smoking status: Current Every Day Smoker    Packs/day: 1.50    Years: 17.00    Pack years: 25.50    Types: Cigarettes    Start date: 10/23/2001  . Smokeless tobacco: Never Used  Substance Use Topics  . Alcohol use: Yes    Alcohol/week: 0.0 standard drinks    Comment: occassinal  . Drug use: Yes    Types: Marijuana    Home Medications  Prior to Admission medications   Medication Sig Start Date End Date Taking? Authorizing Provider  albuterol (VENTOLIN HFA) 108 (90 Base) MCG/ACT inhaler Inhale 2 puffs into the lungs every 6 (six) hours as needed for wheezing or shortness of breath. 12/10/19  Yes Jacquelin Hawking, PA-C  cyclobenzaprine (FLEXERIL) 10 MG tablet Take 1 tablet (10 mg total) by mouth 2 (two) times daily as needed for muscle spasms. 02/23/20  Yes Jaquana Geiger A, PA-C  Multiple Vitamins-Minerals (HAIR SKIN AND NAILS FORMULA) TABS Take 1 tablet by mouth daily.   Yes [provider]  acetaminophen (TYLENOL) 500 MG tablet Take 1 tablet (500 mg total) by mouth every 6 (six) hours as needed. 03/06/20   Skylar Flynt A, PA-C  ibuprofen (ADVIL) 600 MG tablet Take 1 tablet (600 mg total) by mouth every 6 (six) hours as needed. 03/06/20   Luevenia Maxin, Maxwell Martorano A, PA-C  ondansetron (ZOFRAN) 4 MG tablet Take 1 tablet (4 mg total) by mouth every 8 (eight) hours as needed for nausea or vomiting. 03/06/20   Luevenia Maxin, Miqueas Whilden A, PA-C    tamsulosin (FLOMAX) 0.4 MG CAPS capsule Take 1 capsule (0.4 mg total) by mouth daily after supper. 03/06/20   Eloyse Causey A, PA-C  traMADol (ULTRAM) 50 MG tablet Take 1 tablet (50 mg total) by mouth every 12 (twelve) hours as needed for severe pain. 03/06/20   Chau Savell A, PA-C    Allergies    Sulfa antibiotics  Review of Systems   Review of Systems  Constitutional: Negative for chills and fever.  Respiratory: Negative for shortness of breath.   Cardiovascular: Negative for chest pain.  Gastrointestinal: Positive for abdominal pain and nausea. Negative for vomiting.  Genitourinary: Positive for flank pain and hematuria. Negative for frequency, vaginal bleeding and vaginal discharge.  All other systems reviewed and are negative.   Physical Exam Updated Vital Signs BP 110/71   Pulse 88   Temp 98.4 F (36.9 C)   Resp 18   Ht 5\' 3"  (1.6 m)   Wt 95.3 kg   LMP 02/08/2020 (Exact Date)   SpO2 98%   BMI 37.20 kg/m   Physical Exam Vitals and nursing note reviewed.  Constitutional:      General: She is not in acute distress.    Appearance: She is well-developed.     Comments: Appears mildly uncomfortable  HENT:     Head: Normocephalic and atraumatic.  Eyes:     General:        Right eye: No discharge.        Left eye: No discharge.     Conjunctiva/sclera: Conjunctivae normal.  Neck:     Vascular: No JVD.     Trachea: No tracheal deviation.  Cardiovascular:     Rate and Rhythm: Normal rate and regular rhythm.  Pulmonary:     Effort: Pulmonary effort is normal.     Breath sounds: Normal breath sounds.  Abdominal:     General: Bowel sounds are normal. There is no distension.     Palpations: Abdomen is soft.     Tenderness: There is no abdominal tenderness. There is no right CVA tenderness, left CVA tenderness, guarding or rebound.  Musculoskeletal:     Cervical back: Neck supple.  Skin:    General: Skin is warm and dry.     Findings: No erythema.  Neurological:      Mental Status: She is alert.  Psychiatric:        Behavior: Behavior normal.  ED Results / Procedures / Treatments   Labs (all labs ordered are listed, but only abnormal results are displayed) Labs Reviewed  URINALYSIS, ROUTINE W REFLEX MICROSCOPIC - Abnormal; Notable for the following components:      Result Value   APPearance CLOUDY (*)    Hgb urine dipstick LARGE (*)    Protein, ur 100 (*)    RBC / HPF >50 (*)    All other components within normal limits  COMPREHENSIVE METABOLIC PANEL - Abnormal; Notable for the following components:   Glucose, Bld 108 (*)    Calcium 8.4 (*)    Total Protein 6.3 (*)    Albumin 3.3 (*)    Total Bilirubin 0.2 (*)    All other components within normal limits  CBC WITH DIFFERENTIAL/PLATELET - Abnormal; Notable for the following components:   WBC 12.8 (*)    Neutro Abs 8.9 (*)    All other components within normal limits  LIPASE, BLOOD  POC URINE PREG, ED    EKG None  Radiology CT ABDOMEN PELVIS W CONTRAST  Result Date: 03/05/2020 CLINICAL DATA:  Left upper and lower quadrant pain EXAM: CT ABDOMEN AND PELVIS WITH CONTRAST TECHNIQUE: Multidetector CT imaging of the abdomen and pelvis was performed using the standard protocol following bolus administration of intravenous contrast. CONTRAST:  OMNIPAQUE IOHEXOL 300 MG/ML  SOLN COMPARISON:  02/23/2020 FINDINGS: Lower chest: Lung bases demonstrate no acute consolidation or effusion. Hepatobiliary: No focal liver abnormality is seen. No gallstones, gallbladder wall thickening, or biliary dilatation. Pancreas: Unremarkable. No pancreatic ductal dilatation or surrounding inflammatory changes. Spleen: Normal in size without focal abnormality. Adrenals/Urinary Tract: Adrenal glands are normal. Interim development of mild left hydronephrosis, secondary to a 4 x 7 mm stone within the proximal left ureter about 2 cm distal to the left UPJ. Bladder is unremarkable. Punctate stone lower pole right  kidney. Stomach/Bowel: Stomach is within normal limits. Appendix appears normal. No evidence of bowel wall thickening, distention, or inflammatory changes. Vascular/Lymphatic: No significant vascular findings are present. No enlarged abdominal or pelvic lymph nodes. Reproductive: Bilateral tubal ligation clips.  No adnexal mass. Other: No abdominal wall hernia or abnormality. No abdominopelvic ascites. Musculoskeletal: No acute or significant osseous findings. IMPRESSION: 1. Interval mild left hydronephrosis, secondary to a 5 x 7 mm stone within the proximal left ureter about 2 cm distal to the left UPJ. 2. Otherwise no CT evidence for acute intra-abdominal or pelvic abnormality Electronically Signed   By: Jasmine Pang M.D.   On: 03/05/2020 23:22    Procedures Procedures (including critical care time)  Medications Ordered in ED Medications  ondansetron (ZOFRAN) injection 4 mg (4 mg Intravenous Given 03/05/20 2215)  morphine 4 MG/ML injection 4 mg (4 mg Intravenous Given 03/05/20 2216)  iohexol (OMNIPAQUE) 300 MG/ML solution 100 mL (100 mLs Intravenous Contrast Given 03/05/20 2252)  ketorolac (TORADOL) 30 MG/ML injection 30 mg (30 mg Intravenous Given 03/05/20 2336)    ED Course  I have reviewed the triage vital signs and the nursing notes.  Pertinent labs & imaging results that were available during my care of the patient were reviewed by me and considered in my medical decision making (see chart for details).    MDM Rules/Calculators/A&P                      Patient presenting for evaluation of abdominal pain, left flank pain and hematuria.  She is afebrile, vital signs are stable.  She is uncomfortable but  nontoxic in appearance.  No red flag signs concerning for cauda equina or spinal abscess.  No midline spine tenderness on examination.  She is afebrile.  Abdomen is soft with no tenderness on my initial assessment.  No rebound or guarding noted.  Lab work reviewed and interpreted by myself  shows leukocytosis of 12.8, no anemia, no metabolic derangements, no renal insufficiency.  Her UA shows hematuria, less concerning for UTI.  Imaging today obtained shows left hydronephrosis secondary to a proximal 5 x 7 mm stone within the left ureter.  Otherwise no concerning abnormalities, no evidence of acute surgical abdominal pathology.   Patient was given IV fluids, antiemetics and pain medicine in the ED and on reevaluation is resting more comfortably reports that she feels a little bit better.  She would like to go home.  She has been tolerating p.o. fluids at home.  We discussed importance of follow-up with urology for reevaluation given the size and location of her stone.  We discussed strict ED return precautions.  Discussed appropriate use of medications and potential side effects.  Patient verbalized understanding of and agreement with plan and is stable for discharge at this time.   Final Clinical Impression(s) / ED Diagnoses Final diagnoses:  Hydronephrosis with urinary obstruction due to ureteral calculus  Ureterolithiasis    Rx / DC Orders ED Discharge Orders         Ordered    traMADol (ULTRAM) 50 MG tablet  Every 12 hours PRN     03/06/20 0040    ondansetron (ZOFRAN) 4 MG tablet  Every 8 hours PRN     03/06/20 0040    ibuprofen (ADVIL) 600 MG tablet  Every 6 hours PRN     03/06/20 0040    acetaminophen (TYLENOL) 500 MG tablet  Every 6 hours PRN     03/06/20 0040    tamsulosin (FLOMAX) 0.4 MG CAPS capsule  Daily after supper     03/06/20 0040           Jeanie Sewer, PA-C 03/06/20 1104    Bethann Berkshire, MD 03/06/20 1110

## 2020-03-06 ENCOUNTER — Emergency Department (HOSPITAL_COMMUNITY)
Admission: EM | Admit: 2020-03-06 | Discharge: 2020-03-06 | Disposition: A | Discharge status: Home / Self Care | Payer: Self-pay | Attending: Emergency Medicine | Admitting: Emergency Medicine

## 2020-03-06 ENCOUNTER — Emergency Department (HOSPITAL_COMMUNITY): Payer: Self-pay

## 2020-03-06 ENCOUNTER — Encounter (HOSPITAL_COMMUNITY): Payer: Self-pay

## 2020-03-06 DIAGNOSIS — Z79899 Other long term (current) drug therapy: Secondary | ICD-10-CM | POA: Insufficient documentation

## 2020-03-06 DIAGNOSIS — J45909 Unspecified asthma, uncomplicated: Secondary | ICD-10-CM | POA: Insufficient documentation

## 2020-03-06 DIAGNOSIS — K5900 Constipation, unspecified: Secondary | ICD-10-CM | POA: Insufficient documentation

## 2020-03-06 DIAGNOSIS — N201 Calculus of ureter: Secondary | ICD-10-CM | POA: Insufficient documentation

## 2020-03-06 DIAGNOSIS — K5903 Drug induced constipation: Secondary | ICD-10-CM

## 2020-03-06 DIAGNOSIS — F17210 Nicotine dependence, cigarettes, uncomplicated: Secondary | ICD-10-CM | POA: Insufficient documentation

## 2020-03-06 LAB — BASIC METABOLIC PANEL
Anion gap: 9 (ref 5–15)
BUN: 13 mg/dL (ref 6–20)
CO2: 24 mmol/L (ref 22–32)
Calcium: 8.5 mg/dL — ABNORMAL LOW (ref 8.9–10.3)
Chloride: 103 mmol/L (ref 98–111)
Creatinine, Ser: 0.58 mg/dL (ref 0.44–1.00)
GFR calc Af Amer: >60 mL/min (ref >60)
GFR calc non Af Amer: >60 mL/min (ref >60)
Glucose, Bld: 77 mg/dL (ref 70–99)
Potassium: 3.8 mmol/L (ref 3.5–5.1)
Sodium: 136 mmol/L (ref 135–145)

## 2020-03-06 LAB — CBC WITH DIFFERENTIAL/PLATELET
Abs Immature Granulocytes: 0.05 10*3/uL (ref 0.00–0.07)
Basophils Absolute: 0.1 10*3/uL (ref 0.0–0.1)
Basophils Relative: 1 %
Eosinophils Absolute: 0.5 10*3/uL (ref 0.0–0.5)
Eosinophils Relative: 4 %
HCT: 38.5 % (ref 36.0–46.0)
Hemoglobin: 12.7 g/dL (ref 12.0–15.0)
Immature Granulocytes: 0 %
Lymphocytes Relative: 27 %
Lymphs Abs: 3 10*3/uL (ref 0.7–4.0)
MCH: 30.3 pg (ref 26.0–34.0)
MCHC: 33 g/dL (ref 30.0–36.0)
MCV: 91.9 fL (ref 80.0–100.0)
Monocytes Absolute: 0.8 10*3/uL (ref 0.1–1.0)
Monocytes Relative: 7 %
Neutro Abs: 6.9 10*3/uL (ref 1.7–7.7)
Neutrophils Relative %: 61 %
Platelets: 299 10*3/uL (ref 150–400)
RBC: 4.19 MIL/uL (ref 3.87–5.11)
RDW: 13.1 % (ref 11.5–15.5)
WBC: 11.3 10*3/uL — ABNORMAL HIGH (ref 4.0–10.5)
nRBC: 0 % (ref 0.0–0.2)

## 2020-03-06 LAB — URINALYSIS, ROUTINE W REFLEX MICROSCOPIC
Bilirubin Urine: NEGATIVE
Glucose, UA: NEGATIVE mg/dL
Ketones, ur: NEGATIVE mg/dL
Leukocytes,Ua: NEGATIVE
Nitrite: NEGATIVE
Protein, ur: NEGATIVE mg/dL
Specific Gravity, Urine: 1.005 (ref 1.005–1.030)
pH: 6 (ref 5.0–8.0)

## 2020-03-06 MED ORDER — CEPHALEXIN 500 MG PO CAPS: 500.0000 mg | ORAL_CAPSULE | Freq: Two times a day (BID) | ORAL | 0 refills | Status: AC

## 2020-03-06 MED ORDER — IBUPROFEN 600 MG PO TABS
600.0000 mg | ORAL_TABLET | Freq: Four times a day (QID) | ORAL | 0 refills | Status: DC | PRN
Start: 2020-03-06 — End: 2020-04-01

## 2020-03-06 MED ORDER — TAMSULOSIN HCL 0.4 MG PO CAPS: 0.4000 mg | ORAL_CAPSULE | Freq: Every day | ORAL | 0 refills | Status: DC

## 2020-03-06 MED ORDER — TRAMADOL HCL 50 MG PO TABS: 50.0000 mg | ORAL_TABLET | Freq: Two times a day (BID) | ORAL | 0 refills | Status: DC | PRN

## 2020-03-06 MED ORDER — ONDANSETRON HCL 4 MG PO TABS: 4.0000 mg | ORAL_TABLET | Freq: Three times a day (TID) | ORAL | 0 refills | Status: DC | PRN

## 2020-03-06 MED ORDER — ACETAMINOPHEN 500 MG PO TABS
500.0000 mg | ORAL_TABLET | Freq: Four times a day (QID) | ORAL | 0 refills | Status: DC | PRN
Start: 2020-03-06 — End: 2020-04-01

## 2020-03-06 MED ORDER — POLYETHYLENE GLYCOL 3350 17 GM/SCOOP PO POWD: 17.0000 g | Freq: Every day | ORAL | 0 refills | Status: DC

## 2020-03-06 MED ORDER — CEPHALEXIN 500 MG PO CAPS
500.0000 mg | ORAL_CAPSULE | Freq: Once | ORAL | Status: AC
  Administered 2020-03-06: 500 mg via ORAL
  Filled 2020-03-06: qty 1

## 2020-03-06 MED ORDER — BISACODYL 10 MG RE SUPP
10.0000 mg | Freq: Once | RECTAL | Status: AC
  Administered 2020-03-06: 10 mg via RECTAL
  Filled 2020-03-06: qty 1

## 2020-03-06 NOTE — Discharge Instructions (Signed)
1. Medications: Alternate 600 mg of ibuprofen and [phone] mg of Tylenol every 3 hours as needed for pain. Do not exceed 4000 mg of Tylenol daily.  Take ibuprofen with food to avoid upset stomach issues.  Take Zofran as needed for nausea.  Let this medicine dissolve under your tongue and wait around 10-15 minutes before eating or drinking after taking this medication to give it time to work.  Take Flomax as prescribed.  This medicine will open up the ureter(the tube connecting the bladder to the kidney) to allow for easier passage of the kidney stone.  Be aware this medication can cause a drop in your blood pressure so be careful when going from laying or sitting to standing as you may feel lightheaded.  If you feel very fatigued, lightheaded, or have persistently low blood pressures, stop taking this medication entirely. You can take tramadol as needed for severe pain but do not drive, drink alcohol, or operate heavy machinery while taking this medicine as it can cause drowsiness.   2. Treatment: rest, drink plenty of fluids.  3. Follow Up: Please followup with urology as soon as possible (preferably this week) for discussion of your diagnoses and further evaluation after today's visit; Please return to the ER for persistent vomiting, high fevers or worsening symptoms

## 2020-03-06 NOTE — ED Triage Notes (Signed)
Pt comes in POV for known kidney stones, difficulty urinating, and unable to have a BM. Last BM yesterday.

## 2020-03-06 NOTE — Discharge Instructions (Signed)
You have been given a dose of the medication that should help stimulate a bowel movement.  I also recommend the MiraLAX powder that was prescribed, although you do not need a prescription for this medication as you can find it on the shelf.  I recommend taking 12 doses daily until your stools are softer and more regular and while you are on this pain medication which I suspect is the source of your constipation.  I spoke with Dr. Laverle Patter of [facility] and he would like you to get seen by their office on Monday as he anticipates you would benefit from lithotripsy to help resolve this kidney stone.  Be aware that you should get rechecked immediately if you develop any fevers or nausea or vomiting.  You are being placed on an antibiotic this evening to help prevent infection in your urinary tract.  Take your next dose of Keflex tomorrow morning.

## 2020-03-06 NOTE — ED Provider Notes (Signed)
[facility] EMERGENCY DEPARTMENT Provider Note   CSN: [id number] Arrival date & time: 03/06/20  1841     History Chief Complaint  Patient presents with  . Flank Pain    Tracie Aguilar is a 34 y.o. female with a history of PTSD, asthma and nephrolithiasis who was seen here yesterday and diagnosed with a left 5 x 7 mm stone in the upper ureter approximately 2 cm distal to the UPJ, presenting with increased abdominal pain and distention in association with constipation.  She states she normally has a bowel movement every day but has been unable to have a bowel movement x 2 days.  She is passing flatus, she denies rectal pain or pressure.  She also notes reduced urinary frequency, although has been increasing her fluid intake in an attempt to pass this stone.  She is using tramadol for pain relief and has also started her Flomax today.   She denies fevers or chills.  She had hematuria yesterday but this seems to have cleared today.  She has had no medications for treatment of her constipation. She denies rectal pain or pressure.   The history is provided by the patient.       Past Medical History:  Diagnosis Date  . Anxiety   . Asthma    childhood asthma  . Depression   . Dysrhythmia   . Kidney stones 2007   passed x2  . Post traumatic stress disorder    prior medications   . Tachycardia     Patient Active Problem List   Diagnosis Date Noted  . PTSD (post-traumatic stress disorder) 06/03/2017  . 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 Depression 07/21/2015  . Encounter for smoking cessation counseling 07/21/2015  . Health care maintenance 07/21/2015    Past Surgical History:  Procedure Laterality Date  . EXCISION VAGINAL CYST Left 12/24/2019   Procedure: REMOVAL OF LEFT PERICLITORAL LESION;  Surgeon: Florian Buff, MD;  Location: [facility];  Service: Gynecology;  Laterality: Left;  . TUBAL LIGATION  2013     OB History     Gravida  5   Para  3   Term      Preterm      AB      Living        SAB      TAB      Ectopic      Multiple      Live Births              Family History  Problem Relation Age of Onset  . COPD Mother   . Alcohol abuse Mother   . Arthritis Mother        OA  . Hypertension Mother   . Alcohol abuse Father   . Hypertension Father   . Diabetes Father   . Lung cancer Maternal Grandmother   . Stroke Maternal Grandfather   . Hypertension Maternal Grandfather   . Diabetes Maternal Grandfather   . Stroke Paternal Grandfather   . Breast cancer Cousin   . Diabetes Cousin     Social History   Tobacco Use  . Smoking status: Current Every Day Smoker    Packs/day: 1.50    Years: 17.00    Pack years: 25.50    Types: Cigarettes    Start date: 10/23/2001  . Smokeless tobacco: Never Used  Substance Use Topics  . Alcohol use: Yes    Alcohol/week: 0.0 standard  drinks    Comment: occassinal  . Drug use: Yes    Types: Marijuana    Home Medications Prior to Admission medications   Medication Sig Start Date End Date Taking? Authorizing Provider  acetaminophen (TYLENOL) 500 MG tablet Take 1 tablet (500 mg total) by mouth every 6 (six) hours as needed. 03/06/20  Yes Fawze, Mina A, PA-C  albuterol (VENTOLIN HFA) 108 (90 Base) MCG/ACT inhaler Inhale 2 puffs into the lungs every 6 (six) hours as needed for wheezing or shortness of breath. 12/10/19  Yes Jacquelin Hawking, PA-C  cyclobenzaprine (FLEXERIL) 10 MG tablet Take 1 tablet (10 mg total) by mouth 2 (two) times daily as needed for muscle spasms. 02/23/20  Yes Fawze, Mina A, PA-C  ibuprofen (ADVIL) 600 MG tablet Take 1 tablet (600 mg total) by mouth every 6 (six) hours as needed. 03/06/20  Yes Fawze, Mina A, PA-C  Multiple Vitamins-Minerals (HAIR SKIN AND NAILS FORMULA) TABS Take 1 tablet by mouth daily.   Yes [provider]  ondansetron (ZOFRAN) 4 MG tablet Take 1 tablet (4 mg total) by mouth every 8 (eight) hours  as needed for nausea or vomiting. 03/06/20  Yes Luevenia Maxin, Mina A, PA-C  tamsulosin (FLOMAX) 0.4 MG CAPS capsule Take 1 capsule (0.4 mg total) by mouth daily after supper. 03/06/20  Yes Fawze, Mina A, PA-C  traMADol (ULTRAM) 50 MG tablet Take 1 tablet (50 mg total) by mouth every 12 (twelve) hours as needed for severe pain. 03/06/20  Yes Fawze, Mina A, PA-C  cephALEXin (KEFLEX) 500 MG capsule Take 1 capsule (500 mg total) by mouth 2 (two) times daily for 7 days. 03/06/20 03/13/20  Burgess Amor, PA-C  polyethylene glycol powder (GLYCOLAX/MIRALAX) 17 GM/SCOOP powder Take 17 g by mouth daily. 03/06/20   Burgess Amor, PA-C    Allergies    Sulfa antibiotics  Review of Systems   Review of Systems  Constitutional: Negative for fever.  HENT: Negative for congestion and sore throat.   Eyes: Negative.   Respiratory: Negative for chest tightness and shortness of breath.   Cardiovascular: Negative for chest pain.  Gastrointestinal: Positive for abdominal distention and constipation. Negative for nausea.  Genitourinary: Positive for decreased urine volume and flank pain.  Musculoskeletal: Negative for arthralgias, joint swelling and neck pain.  Skin: Negative.  Negative for rash and wound.  Neurological: Negative for dizziness, weakness, light-headedness, numbness and headaches.  Psychiatric/Behavioral: Negative.     Physical Exam Updated Vital Signs BP (!) 106/49   Pulse 80   Temp 98 F (36.7 C) (Oral)   Resp 18   Ht 5\' 3"  (1.6 m)   Wt 95.3 kg   LMP 02/08/2020 (Exact Date) Comment: urine preg test negative on 03/05/20  SpO2 100%   BMI 37.20 kg/m   Physical Exam Vitals and nursing note reviewed.  Constitutional:      Appearance: She is well-developed.  HENT:     Head: Normocephalic and atraumatic.  Eyes:     Conjunctiva/sclera: Conjunctivae normal.  Cardiovascular:     Rate and Rhythm: Normal rate and regular rhythm.     Heart sounds: Normal heart sounds.  Pulmonary:     Effort: Pulmonary  effort is normal.     Breath sounds: Normal breath sounds. No wheezing.  Abdominal:     General: Bowel sounds are normal. There is distension.     Palpations: Abdomen is soft.     Tenderness: There is no abdominal tenderness. There is no guarding or rebound.  Comments: Soft distention without guarding or rebound.  Bowel sounds are normal.  No increased tympany to percussion.  Musculoskeletal:        General: Normal range of motion.     Cervical back: Normal range of motion.  Skin:    General: Skin is warm and dry.  Neurological:     Mental Status: She is alert.     ED Results / Procedures / Treatments   Labs (all labs ordered are listed, but only abnormal results are displayed) Labs Reviewed  URINALYSIS, ROUTINE W REFLEX MICROSCOPIC - Abnormal; Notable for the following components:      Result Value   Color, Urine STRAW (*)    Hgb urine dipstick LARGE (*)    Bacteria, UA MANY (*)    All other components within normal limits  BASIC METABOLIC PANEL - Abnormal; Notable for the following components:   Calcium 8.5 (*)    All other components within normal limits  CBC WITH DIFFERENTIAL/PLATELET - Abnormal; Notable for the following components:   WBC 11.3 (*)    All other components within normal limits  URINE CULTURE    EKG None  Radiology DG Abdomen 1 View  Result Date: 03/06/2020 CLINICAL DATA:  Constipation, history of left ureteral stone EXAM: ABDOMEN - 1 VIEW COMPARISON:  None. FINDINGS: Scattered large and small bowel gas is noted. Considerable retained fecal material is noted consistent with constipation. Known proximal left ureteral stone is again visualized just below the L2 transverse process on the left. Changes of prior tubal ligation are noted. No bony abnormality is seen. IMPRESSION: Stable left ureteral stone. Changes of constipation. Electronically Signed   By: Alcide Clever M.D.   On: 03/06/2020 20:09   CT ABDOMEN PELVIS W CONTRAST  Result Date:  03/05/2020 CLINICAL DATA:  Left upper and lower quadrant pain EXAM: CT ABDOMEN AND PELVIS WITH CONTRAST TECHNIQUE: Multidetector CT imaging of the abdomen and pelvis was performed using the standard protocol following bolus administration of intravenous contrast. CONTRAST:  OMNIPAQUE IOHEXOL 300 MG/ML  SOLN COMPARISON:  02/23/2020 FINDINGS: Lower chest: Lung bases demonstrate no acute consolidation or effusion. Hepatobiliary: No focal liver abnormality is seen. No gallstones, gallbladder wall thickening, or biliary dilatation. Pancreas: Unremarkable. No pancreatic ductal dilatation or surrounding inflammatory changes. Spleen: Normal in size without focal abnormality. Adrenals/Urinary Tract: Adrenal glands are normal. Interim development of mild left hydronephrosis, secondary to a 4 x 7 mm stone within the proximal left ureter about 2 cm distal to the left UPJ. Bladder is unremarkable. Punctate stone lower pole right kidney. Stomach/Bowel: Stomach is within normal limits. Appendix appears normal. No evidence of bowel wall thickening, distention, or inflammatory changes. Vascular/Lymphatic: No significant vascular findings are present. No enlarged abdominal or pelvic lymph nodes. Reproductive: Bilateral tubal ligation clips.  No adnexal mass. Other: No abdominal wall hernia or abnormality. No abdominopelvic ascites. Musculoskeletal: No acute or significant osseous findings. IMPRESSION: 1. Interval mild left hydronephrosis, secondary to a 5 x 7 mm stone within the proximal left ureter about 2 cm distal to the left UPJ. 2. Otherwise no CT evidence for acute intra-abdominal or pelvic abnormality Electronically Signed   By: Jasmine Pang M.D.   On: 03/05/2020 23:22    Procedures Procedures (including critical care time)  Medications Ordered in ED Medications  cephALEXin (KEFLEX) capsule 500 mg (500 mg Oral Given 03/06/20 2047)  bisacodyl (DULCOLAX) suppository 10 mg (10 mg Rectal Given 03/06/20 2150)    ED  Course  I have reviewed  the triage vital signs and the nursing notes.  Pertinent labs & imaging results that were available during my care of the patient were reviewed by me and considered in my medical decision making (see chart for details).    MDM Rules/Calculators/A&P                      Labs and imaging reviewed and discussed with patient.  She still has an elevated WBC count this evening but is improved from yesterdays result.  There is bacteria in urine, no wbc's, no nitrites, this was a clean catch specimen with no epithelial cells.  No change in the level of her renal stone on today kub in comparison to CT from yesterday.  Moderate constipation throughout colon without evidence of rectal impaction.  She was given dulcolax here, also prescribed miralax.   Discussed with Dr. Laverle Patter of urology.  He recommended completing a bladder scan to make sure she is not retaining urine. This was done showing 115 mL so no significant retention.   Encourages judicious use of narcotics to help minimize constipation.  Would add antibiotics while awaiting urine culture results.  Plan follow-up with urology on Monday for anticipation of possible lithotripsy.  Discussed plan with pt who is agreeable.  Return precautions discussed including fevers, chills, vomiting or uncontrolled pain.    Diagnoses Final diagnoses:  Drug-induced constipation  Ureteral stone    Rx / DC Orders ED Discharge Orders         Ordered    cephALEXin (KEFLEX) 500 MG capsule  2 times daily     03/06/20 2120    polyethylene glycol powder (GLYCOLAX/MIRALAX) 17 GM/SCOOP powder  Daily     03/06/20 2120           Burgess Amor, PA-C 03/07/20 0146    Benjiman Core, MD 03/07/20 2330

## 2020-03-06 NOTE — ED Notes (Signed)
Pt here recently dx w renal stone  Returns tonight because feels that she is not emptying her bladder when she urinates   Also reports that she feels that she cannot have a bm

## 2020-03-08 LAB — URINE CULTURE: Culture: <10000 — AB

## 2020-03-10 ENCOUNTER — Encounter (HOSPITAL_COMMUNITY): Payer: Self-pay | Admitting: Emergency Medicine

## 2020-03-10 ENCOUNTER — Other Ambulatory Visit (HOSPITAL_COMMUNITY)
Admission: RE | Admit: 2020-03-10 | Discharge: 2020-03-10 | Disposition: A | Discharge status: Home / Self Care | Payer: Medicaid Other | Source: Ambulatory Visit | Attending: Urology | Admitting: Urology

## 2020-03-10 ENCOUNTER — Encounter: Payer: Self-pay | Admitting: Urology

## 2020-03-10 ENCOUNTER — Other Ambulatory Visit: Payer: Self-pay

## 2020-03-10 ENCOUNTER — Ambulatory Visit (INDEPENDENT_AMBULATORY_CARE_PROVIDER_SITE_OTHER): Payer: Medicaid Other | Admitting: Urology

## 2020-03-10 VITALS — BP 103/69 | HR 76 | Temp 98.4°F | Ht 63.0 in | Wt 220.0 lb

## 2020-03-10 DIAGNOSIS — R000 Tachycardia, unspecified: Secondary | ICD-10-CM | POA: Insufficient documentation

## 2020-03-10 DIAGNOSIS — Z811 Family history of alcohol abuse and dependence: Secondary | ICD-10-CM | POA: Insufficient documentation

## 2020-03-10 DIAGNOSIS — F17200 Nicotine dependence, unspecified, uncomplicated: Secondary | ICD-10-CM | POA: Insufficient documentation

## 2020-03-10 DIAGNOSIS — Z801 Family history of malignant neoplasm of trachea, bronchus and lung: Secondary | ICD-10-CM | POA: Insufficient documentation

## 2020-03-10 DIAGNOSIS — N132 Hydronephrosis with renal and ureteral calculous obstruction: Secondary | ICD-10-CM

## 2020-03-10 DIAGNOSIS — Z803 Family history of malignant neoplasm of breast: Secondary | ICD-10-CM | POA: Insufficient documentation

## 2020-03-10 DIAGNOSIS — Z01812 Encounter for preprocedural laboratory examination: Secondary | ICD-10-CM | POA: Diagnosis not present

## 2020-03-10 DIAGNOSIS — F419 Anxiety disorder, unspecified: Secondary | ICD-10-CM | POA: Insufficient documentation

## 2020-03-10 DIAGNOSIS — J45909 Unspecified asthma, uncomplicated: Secondary | ICD-10-CM | POA: Insufficient documentation

## 2020-03-10 DIAGNOSIS — K5900 Constipation, unspecified: Secondary | ICD-10-CM | POA: Insufficient documentation

## 2020-03-10 DIAGNOSIS — F332 Major depressive disorder, recurrent severe without psychotic features: Secondary | ICD-10-CM | POA: Insufficient documentation

## 2020-03-10 DIAGNOSIS — Z8261 Family history of arthritis: Secondary | ICD-10-CM | POA: Insufficient documentation

## 2020-03-10 DIAGNOSIS — Z882 Allergy status to sulfonamides status: Secondary | ICD-10-CM | POA: Insufficient documentation

## 2020-03-10 DIAGNOSIS — F4310 Post-traumatic stress disorder, unspecified: Secondary | ICD-10-CM | POA: Insufficient documentation

## 2020-03-10 DIAGNOSIS — Z823 Family history of stroke: Secondary | ICD-10-CM | POA: Insufficient documentation

## 2020-03-10 DIAGNOSIS — F17210 Nicotine dependence, cigarettes, uncomplicated: Secondary | ICD-10-CM | POA: Insufficient documentation

## 2020-03-10 DIAGNOSIS — Z8249 Family history of ischemic heart disease and other diseases of the circulatory system: Secondary | ICD-10-CM | POA: Insufficient documentation

## 2020-03-10 DIAGNOSIS — Z20822 Contact with and (suspected) exposure to covid-19: Secondary | ICD-10-CM | POA: Diagnosis not present

## 2020-03-10 DIAGNOSIS — Z87442 Personal history of urinary calculi: Secondary | ICD-10-CM | POA: Insufficient documentation

## 2020-03-10 DIAGNOSIS — Z86718 Personal history of other venous thrombosis and embolism: Secondary | ICD-10-CM | POA: Insufficient documentation

## 2020-03-10 DIAGNOSIS — Z833 Family history of diabetes mellitus: Secondary | ICD-10-CM | POA: Insufficient documentation

## 2020-03-10 DIAGNOSIS — Z825 Family history of asthma and other chronic lower respiratory diseases: Secondary | ICD-10-CM | POA: Insufficient documentation

## 2020-03-10 LAB — POCT URINALYSIS DIPSTICK
Bilirubin, UA: NEGATIVE
Glucose, UA: NEGATIVE
Ketones, UA: NEGATIVE
Nitrite, UA: NEGATIVE
Protein, UA: POSITIVE — AB
Spec Grav, UA: <=1.005 — AB (ref 1.010–1.025)
Urobilinogen, UA: 0.2 E.U./dL
pH, UA: 8.5 — AB (ref 5.0–8.0)

## 2020-03-10 NOTE — Progress Notes (Signed)
Urological Symptom Review  Patient is experiencing the following symptoms: Frequent urination Get up at night to urinate Trouble starting stream Blood in urine Injury to kidneys/bladder Vaginal bleeding (female only) Weak stream  Kidney stones   Review of Systems  Gastrointestinal (upper)  : Nausea Vomiting  Gastrointestinal (lower) : Diarrhea Constipation  Constitutional : Negative for symptoms  Skin: Negative for skin symptoms  Eyes: Negative for eye symptoms  Ear/Nose/Throat : Negative for Ear/Nose/Throat symptoms  Hematologic/Lymphatic: Negative for Hematologic/Lymphatic symptoms  Cardiovascular : Negative for cardiovascular symptoms  Respiratory : Negative for respiratory symptoms  Endocrine: Negative for endocrine symptoms  Musculoskeletal: Back pain  Neurological: Negative for neurological symptoms  Psychologic: Negative for psychiatric symptoms

## 2020-03-10 NOTE — ED Triage Notes (Signed)
Pt here for continued LEFT flank pain since 4pm this afternoon. Pt scheduled for surgery for kidney stones Friday (5/21).

## 2020-03-10 NOTE — Patient Instructions (Signed)
Tracie Aguilar  03/10/2020     @PREFPERIOPPHARMACY @   Your procedure is scheduled on  03/12/2020 .  Report to [facility] ([facility]) at  1100  A.M.  Call this number if you have problems the morning of surgery:  [phone]   Remember:  Do not eat or drink after midnight.                         Take these medicines the morning of surgery with A SIP OF WATER flexeril(if needed), flomax, tramadol(if needed). Use your inhaler before you come.    Do not wear jewelry, make-up or nail polish.  Do not wear lotions, powders, or perfumes. Please wear deodorant and brush your teeth.  Do not shave 48 hours prior to surgery.  Men may shave face and neck.  Do not bring valuables to the hospital.  [facility] is not responsible for any belongings or valuables.  Contacts, dentures or bridgework may not be worn into surgery.  Leave your suitcase in the car.  After surgery it may be brought to your room.  For patients admitted to the hospital, discharge time will be determined by your treatment team.  Patients discharged the day of surgery will not be allowed to drive home.   Name and phone number of your driver:   family Special instructions:  DO NOT smoke the morning of your procedure.  Please read over the following fact sheets that you were given. Anesthesia Post-op Instructions and Care and Recovery After Surgery       Ureteral Stent Implantation, Care After This sheet gives you information about how to care for yourself after your procedure. Your health care provider may also give you more specific instructions. If you have problems or questions, contact your health care provider. What can I expect after the procedure? After the procedure, it is common to have:  Nausea.  Mild pain when you urinate. You may feel this pain in your lower back or lower abdomen. The pain should stop within a few minutes after you urinate. This may last for up to 1 week.  A small amount of  blood in your urine for several days. Follow these instructions at home: Medicines  Take over-the-counter and prescription medicines only as told by your health care provider.  If you were prescribed an antibiotic medicine, take it as told by your health care provider. Do not stop taking the antibiotic even if you start to feel better.  Do not drive for 24 hours if you were given a sedative during your procedure.  Ask your health care provider if the medicine prescribed to you requires you to avoid driving or using heavy machinery. Activity  Rest as told by your health care provider.  Avoid sitting for a long time without moving. Get up to take short walks every 1-2 hours. This is important to improve blood flow and breathing. Ask for help if you feel weak or unsteady.  Return to your normal activities as told by your health care provider. Ask your health care provider what activities are safe for you. General instructions   Watch for any blood in your urine. Call your health care provider if the amount of blood in your urine increases.  If you have a catheter: ? Follow instructions from your health care provider about taking care of your catheter and collection bag. ? Do not take baths, swim,  or use a hot tub until your health care provider approves. Ask your health care provider if you may take showers. You may only be allowed to take sponge baths.  Drink enough fluid to keep your urine pale yellow.  Do not use any products that contain nicotine or tobacco, such as cigarettes, e-cigarettes, and chewing tobacco. These can delay healing after surgery. If you need help quitting, ask your health care provider.  Keep all follow-up visits as told by your health care provider. This is important. Contact a health care provider if:  You have pain that gets worse or does not get better with medicine, especially pain when you urinate.  You have difficulty urinating.  You feel nauseous or  you vomit repeatedly during a period of more than 2 days after the procedure. Get help right away if:  Your urine is dark red or has blood clots in it.  You are leaking urine (have incontinence).  The end of the stent comes out of your urethra.  You cannot urinate.  You have sudden, sharp, or severe pain in your abdomen or lower back.  You have a fever.  You have swelling or pain in your legs.  You have difficulty breathing. Summary  After the procedure, it is common to have mild pain when you urinate that goes away within a few minutes after you urinate. This may last for up to 1 week.  Watch for any blood in your urine. Call your health care provider if the amount of blood in your urine increases.  Take over-the-counter and prescription medicines only as told by your health care provider.  Drink enough fluid to keep your urine pale yellow. This information is not intended to replace advice given to you by your health care provider. Make sure you discuss any questions you have with your health care provider. Document Revised: 07/16/2018 Document Reviewed: 07/17/2018 Elsevier Patient Education  2020 [employer].  Cystoscopy Cystoscopy is a procedure that is used to help diagnose and sometimes treat conditions that affect the lower urinary tract. The lower urinary tract includes the bladder and the urethra. The urethra is the tube that drains urine from the bladder. Cystoscopy is done using a thin, tube-shaped instrument with a light and camera at the end (cystoscope). The cystoscope may be hard or flexible, depending on the goal of the procedure. The cystoscope is inserted through the urethra, into the bladder. Cystoscopy may be recommended if you have:  Urinary tract infections that keep coming back.  Blood in the urine (hematuria).  An inability to control when you urinate (urinary incontinence) or an overactive bladder.  Unusual cells found in a urine sample.  A  blockage in the urethra, such as a urinary stone.  Painful urination.  An abnormality in the bladder found during an intravenous pyelogram (IVP) or CT scan. Cystoscopy may also be done to remove a sample of tissue to be examined under a microscope (biopsy). Tell a health care provider about:  Any allergies you have.  All medicines you are taking, including vitamins, herbs, eye drops, creams, and over-the-counter medicines.  Any problems you or family members have had with anesthetic medicines.  Any blood disorders you have.  Any surgeries you have had.  Any medical conditions you have.  Whether you are pregnant or may be pregnant. What are the risks? Generally, this is a safe procedure. However, problems may occur, including:  Infection.  Bleeding.  Allergic reactions to medicines.  Damage to other  structures or organs. What happens before the procedure?  Ask your health care provider about: ? Changing or stopping your regular medicines. This is especially important if you are taking diabetes medicines or blood thinners. ? Taking medicines such as aspirin and ibuprofen. These medicines can thin your blood. Do not take these medicines unless your health care provider tells you to take them. ? Taking over-the-counter medicines, vitamins, herbs, and supplements.  Follow instructions from your health care provider about eating or drinking restrictions.  Ask your health care provider what steps will be taken to help prevent infection. These may include: ? Washing skin with a germ-killing soap. ? Taking antibiotic medicine.  You may have an exam or testing, such as: ? X-rays of the bladder, urethra, or kidneys. ? Urine tests to check for signs of infection.  Plan to have someone take you home from the hospital or clinic. What happens during the procedure?   You will be given one or more of the following: ? A medicine to help you relax (sedative). ? A medicine to numb  the area (local anesthetic).  The area around the opening of your urethra will be cleaned.  The cystoscope will be passed through your urethra into your bladder.  Germ-free (sterile) fluid will flow through the cystoscope to fill your bladder. The fluid will stretch your bladder so that your health care provider can clearly examine your bladder walls.  Your doctor will look at the urethra and bladder. Your doctor may take a biopsy or remove stones.  The cystoscope will be removed, and your bladder will be emptied. The procedure may vary among health care providers and hospitals. What can I expect after the procedure? After the procedure, it is common to have:  Some soreness or pain in your abdomen and urethra.  Urinary symptoms. These include: ? Mild pain or burning when you urinate. Pain should stop within a few minutes after you urinate. This may last for up to 1 week. ? A small amount of blood in your urine for several days. ? Feeling like you need to urinate but producing only a small amount of urine. Follow these instructions at home: Medicines  Take over-the-counter and prescription medicines only as told by your health care provider.  If you were prescribed an antibiotic medicine, take it as told by your health care provider. Do not stop taking the antibiotic even if you start to feel better. General instructions  Return to your normal activities as told by your health care provider. Ask your health care provider what activities are safe for you.  Do not drive for 24 hours if you were given a sedative during your procedure.  Watch for any blood in your urine. If the amount of blood in your urine increases, call your health care provider.  Follow instructions from your health care provider about eating or drinking restrictions.  If a tissue sample was removed for testing (biopsy) during your procedure, it is up to you to get your test results. Ask your health care provider,  or the department that is doing the test, when your results will be ready.  Drink enough fluid to keep your urine pale yellow.  Keep all follow-up visits as told by your health care provider. This is important. Contact a health care provider if you:  Have pain that gets worse or does not get better with medicine, especially pain when you urinate.  Have trouble urinating.  Have more blood in your urine. Get  help right away if you:  Have blood clots in your urine.  Have abdominal pain.  Have a fever or chills.  Are unable to urinate. Summary  Cystoscopy is a procedure that is used to help diagnose and sometimes treat conditions that affect the lower urinary tract.  Cystoscopy is done using a thin, tube-shaped instrument with a light and camera at the end.  After the procedure, it is common to have some soreness or pain in your abdomen and urethra.  Watch for any blood in your urine. If the amount of blood in your urine increases, call your health care provider.  If you were prescribed an antibiotic medicine, take it as told by your health care provider. Do not stop taking the antibiotic even if you start to feel better. This information is not intended to replace advice given to you by your health care provider. Make sure you discuss any questions you have with your health care provider. Document Revised: 10/01/2018 Document Reviewed: 10/01/2018 Elsevier Patient Education  [place].  Ureteroscopy Ureteroscopy is a procedure to check for and treat problems inside part of the urinary tract. In this procedure, a thin, tube-shaped instrument with a light at the end (ureteroscope) is used to look at the inside of the kidneys and the ureters, which are the tubes that carry urine from the kidneys to the bladder. The ureteroscope is inserted into one or both of the ureters. You may need this procedure if you have frequent urinary tract infections (UTIs), blood in your urine, or  a stone in one of your ureters. A ureteroscopy can be done to find the cause of urine blockage in a ureter and to evaluate other abnormalities inside the ureters or kidneys. If stones are found, they can be removed during the procedure. Polyps, abnormal tissue, and some types of tumors can also be removed or treated. The ureteroscope may also have a tool to remove tissue to be checked for disease under a microscope (biopsy). Tell a health care provider about:  Any allergies you have.  All medicines you are taking, including vitamins, herbs, eye drops, creams, and over-the-counter medicines.  Any problems you or family members have had with anesthetic medicines.  Any blood disorders you have.  Any surgeries you have had.  Any medical conditions you have.  Whether you are pregnant or may be pregnant. What are the risks? Generally, this is a safe procedure. However, problems may occur, including:  Bleeding.  Infection.  Allergic reactions to medicines.  Scarring that narrows the ureter (stricture).  Creating a hole in the ureter (perforation). What happens before the procedure? Staying hydrated Follow instructions from your health care provider about hydration, which may include:  Up to 2 hours before the procedure - you may continue to drink clear liquids, such as water, clear fruit juice, black coffee, and plain tea. Eating and drinking restrictions Follow instructions from your health care provider about eating and drinking, which may include:  8 hours before the procedure - stop eating heavy meals or foods such as meat, fried foods, or fatty foods.  6 hours before the procedure - stop eating light meals or foods, such as toast or cereal.  6 hours before the procedure - stop drinking milk or drinks that contain milk.  2 hours before the procedure - stop drinking clear liquids. Medicines  Ask your health care provider about: ? Changing or stopping your regular medicines.  This is especially important if you are taking diabetes  medicines or blood thinners. ? Taking medicines such as aspirin and ibuprofen. These medicines can thin your blood. Do not take these medicines before your procedure if your health care provider instructs you not to.  You may be given antibiotic medicine to help prevent infection. General instructions  You may have a urine sample taken to check for infection.  Plan to have someone take you home from the hospital or clinic. What happens during the procedure?   To reduce your risk of infection: ? Your health care team will wash or sanitize their hands. ? Your skin will be washed with soap.  An IV tube will be inserted into one of your veins.  You will be given one of the following: ? A medicine to help you relax (sedative). ? A medicine to make you fall asleep (general anesthetic). ? A medicine that is injected into your spine to numb the area below and slightly above the injection site (spinal anesthetic).  To lower your risk of infection, you may be given an antibiotic medicine by an injection or through the IV tube.  The opening from which you urinate (urethra) will be cleaned with a germ-killing solution.  The ureteroscope will be passed through your urethra into your bladder.  A salt-water solution will flow through the ureteroscope to fill your bladder. This will help the health care provider see the openings of your ureters more clearly.  Then, the ureteroscope will be passed into your ureter. ? If a growth is found, a piece of it may be removed so it can be examined under a microscope (biopsy). ? If a stone is found, it may be removed through the ureteroscope, or the stone may be broken up using a laser, shock waves, or electrical energy. ? In some cases, if the ureter is too small, a tube may be inserted that keeps the ureter open (ureteral stent). The stent may be left in place for 1 or 2 weeks to keep the ureter open,  and then the ureteroscopy procedure will be performed.  The scope will be removed, and your bladder will be emptied. The procedure may vary among health care providers and hospitals. What happens after the procedure?  Your blood pressure, heart rate, breathing rate, and blood oxygen level will be monitored until the medicines you were given have worn off.  You may be asked to urinate.  Donot drive for 24 hours if you were given a sedative. This information is not intended to replace advice given to you by your health care provider. Make sure you discuss any questions you have with your health care provider. Document Revised: 09/21/2017 Document Reviewed: 07/21/2016 Elsevier Patient Education  2020 [employer] Monitored Anesthesia Care, Care After These instructions provide you with information about caring for yourself after your procedure. Your health care provider may also give you more specific instructions. Your treatment has been planned according to current medical practices, but problems sometimes occur. Call your health care provider if you have any problems or questions after your procedure. What can I expect after the procedure? After your procedure, you may:  Feel sleepy for several hours.  Feel clumsy and have poor balance for several hours.  Feel forgetful about what happened after the procedure.  Have poor judgment for several hours.  Feel nauseous or vomit.  Have a sore throat if you had a breathing tube during the procedure. Follow these instructions at home: For at least 24 hours after the procedure:  Have a responsible adult stay with you. It is important to have someone help care for you until you are awake and alert.  Rest as needed.  Do not: ? Participate in activities in which you could fall or become injured. ? Drive. ? Use heavy machinery. ? Drink alcohol. ? Take sleeping pills or medicines that cause drowsiness. ? Make important decisions  or sign legal documents. ? Take care of children on your own. Eating and drinking  Follow the diet that is recommended by your health care provider.  If you vomit, drink water, juice, or soup when you can drink without vomiting.  Make sure you have little or no nausea before eating solid foods. General instructions  Take over-the-counter and prescription medicines only as told by your health care provider.  If you have sleep apnea, surgery and certain medicines can increase your risk for breathing problems. Follow instructions from your health care provider about wearing your sleep device: ? Anytime you are sleeping, including during daytime naps. ? While taking prescription pain medicines, sleeping medicines, or medicines that make you drowsy.  If you smoke, do not smoke without supervision.  Keep all follow-up visits as told by your health care provider. This is important. Contact a health care provider if:  You keep feeling nauseous or you keep vomiting.  You feel light-headed.  You develop a rash.  You have a fever. Get help right away if:  You have trouble breathing. Summary  For several hours after your procedure, you may feel sleepy and have poor judgment.  Have a responsible adult stay with you for at least 24 hours or until you are awake and alert. This information is not intended to replace advice given to you by your health care provider. Make sure you discuss any questions you have with your health care provider. Document Revised: 01/07/2018 Document Reviewed: 01/30/2016 Elsevier Patient Education  2020 [employer]

## 2020-03-10 NOTE — Patient Instructions (Signed)

## 2020-03-10 NOTE — Progress Notes (Signed)
Pt. Came in for her COVID test 03/10/20.

## 2020-03-10 NOTE — H&P (View-Only) (Signed)
03/10/2020 10:56 AM   [name] [birth year] [id number]  Referring provider: Jacquelin Hawking, PA-C [address] [place],  [place] [address]  nephrolithiasis  HPI: Ms Tripoli is a 34yo here for evaluation of nephrolithiasis. She has been having intermittent left flank pain for 2 months. She underwent CT on 5/14 with showed a 2mm left proximal ureteral calculus. KUB from 5/15 shows the 19mm left proximal calculus. Mild pain currently mild   PMH: Past Medical History:  Diagnosis Date  . Anxiety   . Asthma    childhood asthma  . Depression   . DVT (deep venous thrombosis) (HCC)   . Dysrhythmia   . Kidney stones 2007   passed x2  . Post traumatic stress disorder    prior medications   . Tachycardia     Surgical History: Past Surgical History:  Procedure Laterality Date  . EXCISION VAGINAL CYST Left 12/24/2019   Procedure: REMOVAL OF LEFT PERICLITORAL LESION;  Surgeon: Lazaro Arms, MD;  Location: [facility];  Service: Gynecology;  Laterality: Left;  . TUBAL LIGATION  2013    Home Medications:  Allergies as of 03/10/2020      Reactions   Sulfa Antibiotics Hives      Medication List       Accurate as of Mar 10, 2020 10:56 AM. If you have any questions, ask your nurse or doctor.        acetaminophen 500 MG tablet Commonly known as: TYLENOL Take 1 tablet (500 mg total) by mouth every 6 (six) hours as needed.   albuterol 108 (90 Base) MCG/ACT inhaler Commonly known as: VENTOLIN HFA Inhale 2 puffs into the lungs every 6 (six) hours as needed for wheezing or shortness of breath.   cephALEXin 500 MG capsule Commonly known as: KEFLEX Take 1 capsule (500 mg total) by mouth 2 (two) times daily for 7 days.   cyclobenzaprine 10 MG tablet Commonly known as: FLEXERIL Take 1 tablet (10 mg total) by mouth 2 (two) times daily as needed for muscle spasms.   Hair Skin and Nails Formula Tabs Take 1 tablet by mouth daily.   ibuprofen 600 MG tablet Commonly known as:  ADVIL Take 1 tablet (600 mg total) by mouth every 6 (six) hours as needed.   ondansetron 4 MG tablet Commonly known as: ZOFRAN Take 1 tablet (4 mg total) by mouth every 8 (eight) hours as needed for nausea or vomiting.   polyethylene glycol powder 17 GM/SCOOP powder Commonly known as: GLYCOLAX/MIRALAX Take 17 g by mouth daily.   tamsulosin 0.4 MG Caps capsule Commonly known as: FLOMAX Take 1 capsule (0.4 mg total) by mouth daily after supper.   traMADol 50 MG tablet Commonly known as: ULTRAM Take 1 tablet (50 mg total) by mouth every 12 (twelve) hours as needed for severe pain.       Allergies:  Allergies  Allergen Reactions  . Sulfa Antibiotics Hives    Family History: Family History  Problem Relation Age of Onset  . COPD Mother   . Alcohol abuse Mother   . Arthritis Mother        OA  . Hypertension Mother   . Alcohol abuse Father   . Hypertension Father   . Diabetes Father   . Lung cancer Maternal Grandmother   . Stroke Maternal Grandfather   . Hypertension Maternal Grandfather   . Diabetes Maternal Grandfather   . Stroke Paternal Grandfather   . Breast cancer Cousin   . Diabetes Cousin  Social History:  reports that she has been smoking cigarettes. She started smoking about 18 years ago. She has a 25.50 pack-year smoking history. She has never used smokeless tobacco. She reports current alcohol use. She reports current drug use. Drug: Marijuana.  ROS: All other review of systems were reviewed and are negative except what is noted above in HPI  Physical Exam: BP 103/69   Pulse 76   Temp 98.4 F (36.9 C)   Ht 5\' 3"  (1.6 m)   Wt 220 lb (99.8 kg)   BMI 38.97 kg/m   Constitutional:  Alert and oriented, No acute distress. HEENT: [place] AT, moist mucus membranes.  Trachea midline, no masses. Cardiovascular: No clubbing, cyanosis, or edema. Respiratory: Normal respiratory effort, no increased work of breathing. GI: Abdomen is soft, nontender, nondistended,  no abdominal masses GU: No CVA tenderness.  Lymph: No cervical or inguinal lymphadenopathy. Skin: No rashes, bruises or suspicious lesions. Neurologic: Grossly intact, no focal deficits, moving all 4 extremities. Psychiatric: Normal mood and affect.  Laboratory Data: Lab Results  Component Value Date   WBC 11.3 (H) 03/06/2020   HGB 12.7 03/06/2020   HCT 38.5 03/06/2020   MCV 91.9 03/06/2020   PLT 299 03/06/2020    Lab Results  Component Value Date   CREATININE 0.58 03/06/2020    No results found for: PSA  No results found for: TESTOSTERONE  Lab Results  Component Value Date   HGBA1C 4.9 12/22/2019    Urinalysis    Component Value Date/Time   COLORURINE STRAW (A) 03/06/2020 1904   APPEARANCEUR CLEAR 03/06/2020 1904   LABSPEC 1.005 03/06/2020 1904   PHURINE 6.0 03/06/2020 1904   GLUCOSEU NEGATIVE 03/06/2020 1904   HGBUR LARGE (A) 03/06/2020 1904   BILIRUBINUR neg 03/10/2020 1052   KETONESUR NEGATIVE 03/06/2020 1904   PROTEINUR Positive (A) 03/10/2020 1052   PROTEINUR NEGATIVE 03/06/2020 1904   UROBILINOGEN 0.2 03/10/2020 1052   UROBILINOGEN 0.2 05/24/2015 1250   NITRITE neg 03/10/2020 1052   NITRITE NEGATIVE 03/06/2020 1904   LEUKOCYTESUR Small (1+) (A) 03/10/2020 1052   LEUKOCYTESUR NEGATIVE 03/06/2020 1904    Lab Results  Component Value Date   BACTERIA MANY (A) 03/06/2020    Pertinent Imaging: CT 03/05/2020: Images reviewed and discussed with the patient Results for orders placed during the hospital encounter of 03/06/20  DG Abdomen 1 View   Narrative CLINICAL DATA:  Constipation, history of left ureteral stone  EXAM: ABDOMEN - 1 VIEW  COMPARISON:  None.  FINDINGS: Scattered large and small bowel gas is noted. Considerable retained fecal material is noted consistent with constipation. Known proximal left ureteral stone is again visualized just below the L2 transverse process on the left. Changes of prior tubal ligation are noted. No bony  abnormality is seen.  IMPRESSION: Stable left ureteral stone.  Changes of constipation.   Electronically Signed   By: Inez Catalina M.D.   On: 03/06/2020 20:09    No results found for this or any previous visit. No results found for this or any previous visit. No results found for this or any previous visit. No results found for this or any previous visit. No results found for this or any previous visit. No results found for this or any previous visit. No results found for this or any previous visit.  Assessment & Plan:    1. Hydronephrosis with urinary obstruction due to ureteral calculus -We discussed the management of kidney stones. These options include observation, ureteroscopy, shockwave lithotripsy (ESWL) and  percutaneous nephrolithotomy (PCNL). We discussed which options are relevant to the patient's stone(s). We discussed the natural history of kidney stones as well as the complications of untreated stones and the impact on quality of life without treatment as well as with each of the above listed treatments. We also discussed the efficacy of each treatment in its ability to clear the stone burden. With any of these management options I discussed the signs and symptoms of infection and the need for emergent treatment should these be experienced. For each option we discussed the ability of each procedure to clear the patient of their stone burden.   For observation I described the risks which include but are not limited to silent renal damage, life-threatening infection, need for emergent surgery, failure to pass stone and pain.   For ureteroscopy I described the risks which include bleeding, infection, damage to contiguous structures, positioning injury, ureteral stricture, ureteral avulsion, ureteral injury, need for prolonged ureteral stent, inability to perform ureteroscopy, need for an interval procedure, inability to clear stone burden, stent discomfort/pain, heart attack,  stroke, pulmonary embolus and the inherent risks with general anesthesia.   For shockwave lithotripsy I described the risks which include arrhythmia, kidney contusion, kidney hemorrhage, need for transfusion, pain, inability to adequately break up stone, inability to pass stone fragments, Steinstrasse, infection associated with obstructing stones, need for alternate surgical procedure, need for repeat shockwave lithotripsy, MI, CVA, PE and the inherent risks with anesthesia/conscious sedation.   For PCNL I described the risks including positioning injury, pneumothorax, hydrothorax, need for chest tube, inability to clear stone burden, renal laceration, arterial venous fistula or malformation, need for embolization of kidney, loss of kidney or renal function, need for repeat procedure, need for prolonged nephrostomy tube, ureteral avulsion, MI, CVA, PE and the inherent risks of general anesthesia.   - The patient would like to proceed with ureteroscopic stone extraction  - POCT urinalysis dipstick   No follow-ups on file.  Wilkie Aye, MD  [facility] [place]

## 2020-03-10 NOTE — Progress Notes (Signed)
03/10/2020 10:56 AM   [name] [birth year] [id number]  Referring provider: Jacquelin Hawking, PA-C [address] [place],  [place] [address]  nephrolithiasis  HPI: Ms Tripoli is a 34yo here for evaluation of nephrolithiasis. She has been having intermittent left flank pain for 2 months. She underwent CT on 5/14 with showed a 2mm left proximal ureteral calculus. KUB from 5/15 shows the 19mm left proximal calculus. Mild pain currently mild   PMH: Past Medical History:  Diagnosis Date  . Anxiety   . Asthma    childhood asthma  . Depression   . DVT (deep venous thrombosis) (HCC)   . Dysrhythmia   . Kidney stones 2007   passed x2  . Post traumatic stress disorder    prior medications   . Tachycardia     Surgical History: Past Surgical History:  Procedure Laterality Date  . EXCISION VAGINAL CYST Left 12/24/2019   Procedure: REMOVAL OF LEFT PERICLITORAL LESION;  Surgeon: Lazaro Arms, MD;  Location: [facility];  Service: Gynecology;  Laterality: Left;  . TUBAL LIGATION  2013    Home Medications:  Allergies as of 03/10/2020      Reactions   Sulfa Antibiotics Hives      Medication List       Accurate as of Mar 10, 2020 10:56 AM. If you have any questions, ask your nurse or doctor.        acetaminophen 500 MG tablet Commonly known as: TYLENOL Take 1 tablet (500 mg total) by mouth every 6 (six) hours as needed.   albuterol 108 (90 Base) MCG/ACT inhaler Commonly known as: VENTOLIN HFA Inhale 2 puffs into the lungs every 6 (six) hours as needed for wheezing or shortness of breath.   cephALEXin 500 MG capsule Commonly known as: KEFLEX Take 1 capsule (500 mg total) by mouth 2 (two) times daily for 7 days.   cyclobenzaprine 10 MG tablet Commonly known as: FLEXERIL Take 1 tablet (10 mg total) by mouth 2 (two) times daily as needed for muscle spasms.   Hair Skin and Nails Formula Tabs Take 1 tablet by mouth daily.   ibuprofen 600 MG tablet Commonly known as:  ADVIL Take 1 tablet (600 mg total) by mouth every 6 (six) hours as needed.   ondansetron 4 MG tablet Commonly known as: ZOFRAN Take 1 tablet (4 mg total) by mouth every 8 (eight) hours as needed for nausea or vomiting.   polyethylene glycol powder 17 GM/SCOOP powder Commonly known as: GLYCOLAX/MIRALAX Take 17 g by mouth daily.   tamsulosin 0.4 MG Caps capsule Commonly known as: FLOMAX Take 1 capsule (0.4 mg total) by mouth daily after supper.   traMADol 50 MG tablet Commonly known as: ULTRAM Take 1 tablet (50 mg total) by mouth every 12 (twelve) hours as needed for severe pain.       Allergies:  Allergies  Allergen Reactions  . Sulfa Antibiotics Hives    Family History: Family History  Problem Relation Age of Onset  . COPD Mother   . Alcohol abuse Mother   . Arthritis Mother        OA  . Hypertension Mother   . Alcohol abuse Father   . Hypertension Father   . Diabetes Father   . Lung cancer Maternal Grandmother   . Stroke Maternal Grandfather   . Hypertension Maternal Grandfather   . Diabetes Maternal Grandfather   . Stroke Paternal Grandfather   . Breast cancer Cousin   . Diabetes Cousin  Social History:  reports that she has been smoking cigarettes. She started smoking about 18 years ago. She has a 25.50 pack-year smoking history. She has never used smokeless tobacco. She reports current alcohol use. She reports current drug use. Drug: Marijuana.  ROS: All other review of systems were reviewed and are negative except what is noted above in HPI  Physical Exam: BP 103/69   Pulse 76   Temp 98.4 F (36.9 C)   Ht 5\' 3"  (1.6 m)   Wt 220 lb (99.8 kg)   BMI 38.97 kg/m   Constitutional:  Alert and oriented, No acute distress. HEENT: [place] AT, moist mucus membranes.  Trachea midline, no masses. Cardiovascular: No clubbing, cyanosis, or edema. Respiratory: Normal respiratory effort, no increased work of breathing. GI: Abdomen is soft, nontender, nondistended,  no abdominal masses GU: No CVA tenderness.  Lymph: No cervical or inguinal lymphadenopathy. Skin: No rashes, bruises or suspicious lesions. Neurologic: Grossly intact, no focal deficits, moving all 4 extremities. Psychiatric: Normal mood and affect.  Laboratory Data: Lab Results  Component Value Date   WBC 11.3 (H) 03/06/2020   HGB 12.7 03/06/2020   HCT 38.5 03/06/2020   MCV 91.9 03/06/2020   PLT 299 03/06/2020    Lab Results  Component Value Date   CREATININE 0.58 03/06/2020    No results found for: PSA  No results found for: TESTOSTERONE  Lab Results  Component Value Date   HGBA1C 4.9 12/22/2019    Urinalysis    Component Value Date/Time   COLORURINE STRAW (A) 03/06/2020 1904   APPEARANCEUR CLEAR 03/06/2020 1904   LABSPEC 1.005 03/06/2020 1904   PHURINE 6.0 03/06/2020 1904   GLUCOSEU NEGATIVE 03/06/2020 1904   HGBUR LARGE (A) 03/06/2020 1904   BILIRUBINUR neg 03/10/2020 1052   KETONESUR NEGATIVE 03/06/2020 1904   PROTEINUR Positive (A) 03/10/2020 1052   PROTEINUR NEGATIVE 03/06/2020 1904   UROBILINOGEN 0.2 03/10/2020 1052   UROBILINOGEN 0.2 05/24/2015 1250   NITRITE neg 03/10/2020 1052   NITRITE NEGATIVE 03/06/2020 1904   LEUKOCYTESUR Small (1+) (A) 03/10/2020 1052   LEUKOCYTESUR NEGATIVE 03/06/2020 1904    Lab Results  Component Value Date   BACTERIA MANY (A) 03/06/2020    Pertinent Imaging: CT 03/05/2020: Images reviewed and discussed with the patient Results for orders placed during the hospital encounter of 03/06/20  DG Abdomen 1 View   Narrative CLINICAL DATA:  Constipation, history of left ureteral stone  EXAM: ABDOMEN - 1 VIEW  COMPARISON:  None.  FINDINGS: Scattered large and small bowel gas is noted. Considerable retained fecal material is noted consistent with constipation. Known proximal left ureteral stone is again visualized just below the L2 transverse process on the left. Changes of prior tubal ligation are noted. No bony  abnormality is seen.  IMPRESSION: Stable left ureteral stone.  Changes of constipation.   Electronically Signed   By: Inez Catalina M.D.   On: 03/06/2020 20:09    No results found for this or any previous visit. No results found for this or any previous visit. No results found for this or any previous visit. No results found for this or any previous visit. No results found for this or any previous visit. No results found for this or any previous visit. No results found for this or any previous visit.  Assessment & Plan:    1. Hydronephrosis with urinary obstruction due to ureteral calculus -We discussed the management of kidney stones. These options include observation, ureteroscopy, shockwave lithotripsy (ESWL) and  percutaneous nephrolithotomy (PCNL). We discussed which options are relevant to the patient's stone(s). We discussed the natural history of kidney stones as well as the complications of untreated stones and the impact on quality of life without treatment as well as with each of the above listed treatments. We also discussed the efficacy of each treatment in its ability to clear the stone burden. With any of these management options I discussed the signs and symptoms of infection and the need for emergent treatment should these be experienced. For each option we discussed the ability of each procedure to clear the patient of their stone burden.   For observation I described the risks which include but are not limited to silent renal damage, life-threatening infection, need for emergent surgery, failure to pass stone and pain.   For ureteroscopy I described the risks which include bleeding, infection, damage to contiguous structures, positioning injury, ureteral stricture, ureteral avulsion, ureteral injury, need for prolonged ureteral stent, inability to perform ureteroscopy, need for an interval procedure, inability to clear stone burden, stent discomfort/pain, heart attack,  stroke, pulmonary embolus and the inherent risks with general anesthesia.   For shockwave lithotripsy I described the risks which include arrhythmia, kidney contusion, kidney hemorrhage, need for transfusion, pain, inability to adequately break up stone, inability to pass stone fragments, Steinstrasse, infection associated with obstructing stones, need for alternate surgical procedure, need for repeat shockwave lithotripsy, MI, CVA, PE and the inherent risks with anesthesia/conscious sedation.   For PCNL I described the risks including positioning injury, pneumothorax, hydrothorax, need for chest tube, inability to clear stone burden, renal laceration, arterial venous fistula or malformation, need for embolization of kidney, loss of kidney or renal function, need for repeat procedure, need for prolonged nephrostomy tube, ureteral avulsion, MI, CVA, PE and the inherent risks of general anesthesia.   - The patient would like to proceed with ureteroscopic stone extraction  - POCT urinalysis dipstick   No follow-ups on file.  Jamice Carreno, MD  [facility] [place]  

## 2020-03-11 ENCOUNTER — Encounter (HOSPITAL_COMMUNITY): Payer: Self-pay

## 2020-03-11 ENCOUNTER — Observation Stay (HOSPITAL_COMMUNITY)
Admission: EM | Admit: 2020-03-11 | Discharge: 2020-03-12 | Disposition: A | Discharge status: Home / Self Care | Payer: Self-pay | Attending: Urology | Admitting: Urology

## 2020-03-11 ENCOUNTER — Encounter (HOSPITAL_COMMUNITY)
Admission: RE | Admit: 2020-03-11 | Discharge: 2020-03-11 | Disposition: A | Discharge status: Home / Self Care | Payer: Medicaid Other | Source: Ambulatory Visit | Attending: Urology | Admitting: Urology

## 2020-03-11 DIAGNOSIS — N132 Hydronephrosis with renal and ureteral calculous obstruction: Secondary | ICD-10-CM

## 2020-03-11 DIAGNOSIS — N23 Unspecified renal colic: Secondary | ICD-10-CM

## 2020-03-11 DIAGNOSIS — N200 Calculus of kidney: Secondary | ICD-10-CM

## 2020-03-11 DIAGNOSIS — F4310 Post-traumatic stress disorder, unspecified: Secondary | ICD-10-CM | POA: Diagnosis present

## 2020-03-11 DIAGNOSIS — F332 Major depressive disorder, recurrent severe without psychotic features: Secondary | ICD-10-CM | POA: Diagnosis present

## 2020-03-11 DIAGNOSIS — R52 Pain, unspecified: Secondary | ICD-10-CM

## 2020-03-11 DIAGNOSIS — N201 Calculus of ureter: Secondary | ICD-10-CM

## 2020-03-11 LAB — URINALYSIS, ROUTINE W REFLEX MICROSCOPIC
Bilirubin Urine: NEGATIVE
Glucose, UA: NEGATIVE mg/dL
Ketones, ur: NEGATIVE mg/dL
Leukocytes,Ua: NEGATIVE
Nitrite: NEGATIVE
Protein, ur: 30 mg/dL — AB
RBC / HPF: >50 RBC/hpf — ABNORMAL HIGH (ref 0–5)
Specific Gravity, Urine: 1.024 (ref 1.005–1.030)
pH: 6 (ref 5.0–8.0)

## 2020-03-11 LAB — BASIC METABOLIC PANEL
Anion gap: 8 (ref 5–15)
BUN: 15 mg/dL (ref 6–20)
CO2: 23 mmol/L (ref 22–32)
Calcium: 8.6 mg/dL — ABNORMAL LOW (ref 8.9–10.3)
Chloride: 104 mmol/L (ref 98–111)
Creatinine, Ser: 0.78 mg/dL (ref 0.44–1.00)
GFR calc Af Amer: >60 mL/min (ref >60)
GFR calc non Af Amer: >60 mL/min (ref >60)
Glucose, Bld: 111 mg/dL — ABNORMAL HIGH (ref 70–99)
Potassium: 3.8 mmol/L (ref 3.5–5.1)
Sodium: 135 mmol/L (ref 135–145)

## 2020-03-11 LAB — SARS CORONAVIRUS 2 (TAT 6-24 HRS): SARS Coronavirus 2: NEGATIVE

## 2020-03-11 LAB — HCG, SERUM, QUALITATIVE: Preg, Serum: NEGATIVE

## 2020-03-11 MED ORDER — HYDROXYZINE HCL 25 MG PO TABS
25.0000 mg | ORAL_TABLET | Freq: Three times a day (TID) | ORAL | Status: DC
  Administered 2020-03-11 – 2020-03-12 (×3): 25 mg via ORAL
  Filled 2020-03-11 (×4): qty 1

## 2020-03-11 MED ORDER — SODIUM CHLORIDE 0.9% FLUSH: 3.0000 mL | INTRAVENOUS | Status: DC | PRN

## 2020-03-11 MED ORDER — KETOROLAC TROMETHAMINE 30 MG/ML IJ SOLN
30.0000 mg | Freq: Once | INTRAMUSCULAR | Status: AC
  Administered 2020-03-11: 30 mg via INTRAVENOUS
  Filled 2020-03-11: qty 1

## 2020-03-11 MED ORDER — TRAZODONE HCL 50 MG PO TABS: 50.0000 mg | ORAL_TABLET | Freq: Every evening | ORAL | Status: DC | PRN

## 2020-03-11 MED ORDER — MORPHINE SULFATE (PF) 2 MG/ML IV SOLN
2.0000 mg | INTRAVENOUS | Status: DC | PRN
  Administered 2020-03-11 – 2020-03-12 (×3): 2 mg via INTRAVENOUS
  Filled 2020-03-11 (×3): qty 1

## 2020-03-11 MED ORDER — SODIUM CHLORIDE 0.9 % IV SOLN: INTRAVENOUS | Status: DC

## 2020-03-11 MED ORDER — POLYETHYLENE GLYCOL 3350 17 G PO PACK: 17.0000 g | PACK | Freq: Every day | ORAL | Status: DC | PRN

## 2020-03-11 MED ORDER — ONDANSETRON HCL 4 MG/2ML IJ SOLN
4.0000 mg | Freq: Once | INTRAMUSCULAR | Status: AC
  Administered 2020-03-11: 4 mg via INTRAVENOUS
  Filled 2020-03-11: qty 2

## 2020-03-11 MED ORDER — LORAZEPAM 2 MG/ML IJ SOLN
0.5000 mg | Freq: Once | INTRAMUSCULAR | Status: AC
  Administered 2020-03-11: 0.5 mg via INTRAVENOUS
  Filled 2020-03-11: qty 1

## 2020-03-11 MED ORDER — LORAZEPAM 2 MG/ML IJ SOLN: 1.0000 mg | Freq: Four times a day (QID) | INTRAMUSCULAR | Status: DC | PRN

## 2020-03-11 MED ORDER — ONDANSETRON HCL 4 MG/2ML IJ SOLN
4.0000 mg | INTRAMUSCULAR | Status: DC | PRN
  Administered 2020-03-11: 4 mg via INTRAVENOUS
  Filled 2020-03-11: qty 2

## 2020-03-11 MED ORDER — SODIUM CHLORIDE 0.9 % IV SOLN: 250.0000 mL | INTRAVENOUS | Status: DC | PRN

## 2020-03-11 MED ORDER — HYDROMORPHONE HCL 1 MG/ML IJ SOLN
0.5000 mg | Freq: Once | INTRAMUSCULAR | Status: AC
  Administered 2020-03-11: 0.5 mg via INTRAVENOUS
  Filled 2020-03-11: qty 1

## 2020-03-11 MED ORDER — SODIUM CHLORIDE 0.9% FLUSH
3.0000 mL | Freq: Two times a day (BID) | INTRAVENOUS | Status: DC
  Administered 2020-03-11 – 2020-03-12 (×2): 3 mL via INTRAVENOUS

## 2020-03-11 MED ORDER — HYDROMORPHONE HCL 1 MG/ML IJ SOLN
1.0000 mg | Freq: Once | INTRAMUSCULAR | Status: AC
  Administered 2020-03-11: 1 mg via INTRAVENOUS
  Filled 2020-03-11: qty 1

## 2020-03-11 MED ORDER — TAMSULOSIN HCL 0.4 MG PO CAPS
0.4000 mg | ORAL_CAPSULE | Freq: Every day | ORAL | Status: DC
  Administered 2020-03-11: 0.4 mg via ORAL
  Filled 2020-03-11: qty 1

## 2020-03-11 MED ORDER — TRAZODONE HCL 50 MG PO TABS
50.0000 mg | ORAL_TABLET | Freq: Every day | ORAL | Status: DC
  Administered 2020-03-11: 50 mg via ORAL
  Filled 2020-03-11: qty 1

## 2020-03-11 MED ORDER — KETOROLAC TROMETHAMINE 15 MG/ML IJ SOLN
15.0000 mg | Freq: Four times a day (QID) | INTRAMUSCULAR | Status: DC | PRN
  Administered 2020-03-11 (×2): 15 mg via INTRAVENOUS
  Filled 2020-03-11 (×2): qty 1

## 2020-03-11 MED ORDER — OXYCODONE HCL 5 MG PO TABS: 5.0000 mg | ORAL_TABLET | ORAL | Status: DC | PRN

## 2020-03-11 NOTE — ED Provider Notes (Signed)
[facility] EMERGENCY DEPARTMENT Provider Note   CSN: [id number] Arrival date & time: 03/10/20  2323   Time seen 1:35 AM  History Chief Complaint  Patient presents with  . Flank Pain    Tracie Aguilar is a 33 y.o. female.  HPI Patient has been followed by Dr. Ronne Binning for left foot pain for the last 1 to 2 months. She is supposed to have a procedure done on the 21st. Looking at the chart he has that he is going to do cystoscopy with retrogram pyelogram and ureteral stent placement on the left. Patient had a CT on May 14 showing mild hydronephrosis secondary to a 5 x 7 mm stone within the proximal left ureter about 2 cm distal to the left UPJ. Patient states she was actually seen earlier today, May 19 by Dr. Ronne Binning but she was not having pain then. She states she started having severe left flank pain about 4 PM that is radiating into her left abdomen. She has had nausea and vomiting x3. She is on her menses so she is not sure if she is having hematuria. She denies fever. She states she had kidney stones in 2008 but this is much worse.  PCP Jacquelin Hawking, PA-C     Past Medical History:  Diagnosis Date  . Anxiety   . Asthma    childhood asthma  . Depression   . DVT (deep venous thrombosis) (HCC)   . Dysrhythmia   . Kidney stones 2007   passed x2  . Post traumatic stress disorder    prior medications   . Tachycardia     Patient Active Problem List   Diagnosis Date Noted  . Hydronephrosis with urinary obstruction due to ureteral calculus 03/10/2020  . PTSD (post-traumatic stress disorder) 06/03/2017  . 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 Depression 07/21/2015  . Encounter for smoking cessation counseling 07/21/2015  . Health care maintenance 07/21/2015    Past Surgical History:  Procedure Laterality Date  . EXCISION VAGINAL CYST Left 12/24/2019   Procedure: REMOVAL OF LEFT PERICLITORAL LESION;  Surgeon: Lazaro Arms, MD;  Location: [facility];  Service: Gynecology;  Laterality: Left;  . TUBAL LIGATION  2013     OB History    Gravida  5   Para  3   Term      Preterm      AB      Living        SAB      TAB      Ectopic      Multiple      Live Births              Family History  Problem Relation Age of Onset  . COPD Mother   . Alcohol abuse Mother   . Arthritis Mother        OA  . Hypertension Mother   . Alcohol abuse Father   . Hypertension Father   . Diabetes Father   . Lung cancer Maternal Grandmother   . Stroke Maternal Grandfather   . Hypertension Maternal Grandfather   . Diabetes Maternal Grandfather   . Stroke Paternal Grandfather   . Breast cancer Cousin   . Diabetes Cousin     Social History   Tobacco Use  . Smoking status: Current Every Day Smoker    Packs/day: 1.50    Years: 17.00    Pack years: 25.50    Types: Cigarettes  Start date: 10/23/2001  . Smokeless tobacco: Never Used  Substance Use Topics  . Alcohol use: Yes    Alcohol/week: 0.0 standard drinks    Comment: occassinal  . Drug use: Yes    Types: Marijuana    Home Medications Prior to Admission medications   Medication Sig Start Date End Date Taking? Authorizing Provider  acetaminophen (TYLENOL) 500 MG tablet Take 1 tablet (500 mg total) by mouth every 6 (six) hours as needed. Patient not taking: Reported on 03/10/2020 03/06/20   Michela Pitcher A, PA-C  albuterol (VENTOLIN HFA) 108 (90 Base) MCG/ACT inhaler Inhale 2 puffs into the lungs every 6 (six) hours as needed for wheezing or shortness of breath. 12/10/19   Jacquelin Hawking, PA-C  cephALEXin (KEFLEX) 500 MG capsule Take 1 capsule (500 mg total) by mouth 2 (two) times daily for 7 days. 03/06/20 03/13/20  Burgess Amor, PA-C  cyclobenzaprine (FLEXERIL) 10 MG tablet Take 1 tablet (10 mg total) by mouth 2 (two) times daily as needed for muscle spasms. 02/23/20   Fawze, Mina A, PA-C  ibuprofen (ADVIL) 200 MG tablet Take 800 mg by mouth every  8 (eight) hours as needed (for pain.).    [provider]  ibuprofen (ADVIL) 600 MG tablet Take 1 tablet (600 mg total) by mouth every 6 (six) hours as needed. Patient not taking: Reported on 03/10/2020 03/06/20   Michela Pitcher A, PA-C  Multiple Vitamins-Minerals (EMERGEN-C VITAMIN C PO) Take 750 mg by mouth daily with lunch.    [provider]  Multiple Vitamins-Minerals (HAIR SKIN AND NAILS FORMULA) TABS Take 1 tablet by mouth daily.    [provider]  ondansetron (ZOFRAN) 4 MG tablet Take 1 tablet (4 mg total) by mouth every 8 (eight) hours as needed for nausea or vomiting. Patient not taking: Reported on 03/10/2020 03/06/20   Michela Pitcher A, PA-C  polyethylene glycol powder (GLYCOLAX/MIRALAX) 17 GM/SCOOP powder Take 17 g by mouth daily. Patient not taking: Reported on 03/10/2020 03/06/20   Burgess Amor, PA-C  tamsulosin (FLOMAX) 0.4 MG CAPS capsule Take 1 capsule (0.4 mg total) by mouth daily after supper. Patient taking differently: Take 0.4 mg by mouth daily at 2 PM.  03/06/20   Luevenia Maxin, Mina A, PA-C  traMADol (ULTRAM) 50 MG tablet Take 1 tablet (50 mg total) by mouth every 12 (twelve) hours as needed for severe pain. 03/06/20   Fawze, Mina A, PA-C    Allergies    Sulfa antibiotics  Review of Systems   Review of Systems  All other systems reviewed and are negative.   Physical Exam Updated Vital Signs BP 104/61   Pulse 71   Temp 98.6 F (37 C) (Oral)   Resp 18   Ht 5\' 3"  (1.6 m)   Wt 95.3 kg   SpO2 99%   BMI 37.20 kg/m   Physical Exam Vitals and nursing note reviewed.  Constitutional:      General: She is in acute distress.     Appearance: Normal appearance. She is obese.  HENT:     Head: Normocephalic and atraumatic.     Right Ear: External ear normal.     Left Ear: External ear normal.     Mouth/Throat:     Mouth: Mucous membranes are dry.  Eyes:     Conjunctiva/sclera: Conjunctivae normal.     Pupils: Pupils are equal, round, and reactive to  light.  Cardiovascular:     Rate and Rhythm: Normal rate.  Pulmonary:  Effort: Pulmonary effort is normal. No respiratory distress.     Breath sounds: Normal breath sounds.  Abdominal:     Tenderness: There is abdominal tenderness. There is left CVA tenderness.  Musculoskeletal:        General: Normal range of motion.     Cervical back: Normal range of motion and neck supple.  Skin:    General: Skin is warm and dry.  Neurological:     General: No focal deficit present.     Mental Status: She is alert and oriented to person, place, and time.     Cranial Nerves: No cranial nerve deficit.  Psychiatric:        Mood and Affect: Mood is anxious. Affect is labile.        Speech: Speech is rapid and pressured.        Behavior: Behavior is agitated.     Comments: Patient is sobbing and crying     ED Results / Procedures / Treatments   Labs (all labs ordered are listed, but only abnormal results are displayed) Results for orders placed or performed during the hospital encounter of 03/11/20  Urinalysis, Routine w reflex microscopic  Result Value Ref Range   Color, Urine YELLOW YELLOW   APPearance HAZY (A) CLEAR   Specific Gravity, Urine 1.024 1.005 - 1.030   pH 6.0 5.0 - 8.0   Glucose, UA NEGATIVE NEGATIVE mg/dL   Hgb urine dipstick LARGE (A) NEGATIVE   Bilirubin Urine NEGATIVE NEGATIVE   Ketones, ur NEGATIVE NEGATIVE mg/dL   Protein, ur 30 (A) NEGATIVE mg/dL   Nitrite NEGATIVE NEGATIVE   Leukocytes,Ua NEGATIVE NEGATIVE   RBC / HPF >50 (H) 0 - 5 RBC/hpf   WBC, UA 0-5 0 - 5 WBC/hpf   Bacteria, UA RARE (A) NONE SEEN   Squamous Epithelial / LPF 0-5 0 - 5   Budding Yeast PRESENT   Basic metabolic panel  Result Value Ref Range   Sodium 135 135 - 145 mmol/L   Potassium 3.8 3.5 - 5.1 mmol/L   Chloride 104 98 - 111 mmol/L   CO2 23 22 - 32 mmol/L   Glucose, Bld 111 (H) 70 - 99 mg/dL   BUN 15 6 - 20 mg/dL   Creatinine, Ser 1.30 0.44 - 1.00 mg/dL   Calcium 8.6 (L) 8.9 - 10.3  mg/dL   GFR calc non Af Amer >60 >60 mL/min   GFR calc Af Amer >60 >60 mL/min   Anion gap 8 5 - 15   Laboratory interpretation all normal except hematuria however she is on her menses  Patient's Covid test was negative on 5/19   EKG None  Radiology No results found.   T Head Wo Contrast  Result Date: 02/23/2020 CLINICAL DATA:  Headache, acute, normal neuro exam. Additional history provided: Patient reports headache to left side of head, associated eye watering, dizziness. IMPRESSION: Partially empty sella turcica. This is very commonly an incidental finding, but may be seen in the setting of idiopathic intracranial hypertension. Otherwise, unremarkable head CT without evidence of acute intracranial abnormality. Electronically Signed   By: Jackey Loge DO   On: 02/23/2020 16:59   CT Lumbar Spine Wo Contrast  Result Date: 02/23/2020 CLINICAL DATA:  Central low back pain for 1 month with headaches.  IMPRESSION: 1. No acute findings or explanation for the patient's symptoms. 2. Mild spondylosis at L5-S1 with mild osseous inferior foraminal narrowing on the left. No definite L5 nerve root encroachment. 3. Nonobstructing bilateral renal  calculi. Electronically Signed   By: Richardean Sale M.D.   On: 02/23/2020 16:56   CT ABDOMEN PELVIS W CONTRAST  Result Date: 03/05/2020 CLINICAL DATA:  Left upper and lower quadrant pain  IMPRESSION: 1. Interval mild left hydronephrosis, secondary to a 5 x 7 mm stone within the proximal left ureter about 2 cm distal to the left UPJ. 2. Otherwise no CT evidence for acute intra-abdominal or pelvic abnormality Electronically Signed   By: Donavan Foil M.D.   On: 03/05/2020 23:22    Procedures Procedures (including critical care time)  Medications Ordered in ED Medications  HYDROmorphone (DILAUDID) injection 1 mg (1 mg Intravenous Given 03/11/20 0207)  ondansetron (ZOFRAN) injection 4 mg (4 mg Intravenous Given 03/11/20 0207)  LORazepam (ATIVAN) injection 0.5  mg (0.5 mg Intravenous Given 03/11/20 0211)  HYDROmorphone (DILAUDID) injection 0.5 mg (0.5 mg Intravenous Given 03/11/20 0412)  ketorolac (TORADOL) 30 MG/ML injection 30 mg (30 mg Intravenous Given 03/11/20 0503)    ED Course  I have reviewed the triage vital signs and the nursing notes.  Pertinent labs & imaging results that were available during my care of the patient were reviewed by me and considered in my medical decision making (see chart for details).    MDM Rules/Calculators/A&P                     Patient was given IV Dilaudid and IV Zofran.  She was given IV Ativan because she seems very emotionally upset.  Recheck at 3:40 AM patient is no longer sobbing out while, however she states she still having pain coming and going.  We discussed that I would give her more pain medicine however she might need to be admitted for pain control so she can have her stent done.  Recheck it 4:40 AM patient had gotten a additional dose of Dilaudid 0.5 mg.  She states currently she is not having pain however the last time it hurt her wrist 5 minutes ago.  She states "I cannot take this any longer.  I will talk to urology to see about admitting her for pain control.  Review of the [place] shows patient was only prescribed tramadol for her kidney stone pain when she was seen in the ED on 5/14.  Patient was seen in the ED on May 3 for complaints of headache.  4:56 AM patient discussed with Dr. Diona Fanti, urologist on call.  He states she can have Toradol before cystoscopy.  He states to try that, however if that does not improve her pain they can admit her for pain control until she can have her procedure done.    Recheck 6:25 AM patient states she is better however she still having some pain.  She is no longer crying like she was before.  I will talk to the urologist about admitting her for pain control.  Please note her heart rate has improved from 101 down to 71 and her blood pressure has  improved from 147/88 to 104/61.  6:49 AM Dr. Diona Fanti, urology.  He states they are not in their [place] office today, he is asked to have the hospitalist admit for pain control and they will do her procedure tomorrow.  7:30 AM Dr. Rogene Houston was aware of patient, still waiting for the hospitalist to call back for her admission for pain control.  Final Clinical Impression(s) / ED Diagnoses Final diagnoses:  Ureteral colic  Intractable pain    Rx / DC Orders  Disposition pending  Devoria AlbeIva Deicy Rusk, MD, Concha PyoFACEP    Immanuel Fedak, MD 03/11/20 [phone]

## 2020-03-11 NOTE — Consult Note (Signed)
This patient is scheduled for left ureteroscopic stone management by Dr. Ronne Binning for 5/21.  She did have intractable pain upon presentation to the emergency room.  We have nobody in our [place] site today.  Appreciate help from hospital service for pain management prior to her procedure tomorrow.  I will notify Dr. Ronne Binning of her admission.

## 2020-03-11 NOTE — H&P (Signed)
Patient Demographics:    Tracie Aguilar, is a 34 y.o. female  MRN: [id number]   DOB - 1986-03-15  Admit Date - 03/11/2020  Outpatient Primary MD for the patient is Soyla Dryer, PA-C   Assessment & Plan:    Principal Problem:   Nephrolithiasis Active Problems:   PTSD (post-traumatic stress disorder)   Major depressive disorder, recurrent severe without psychotic features ([place])    1) nephrolithiasis/Renal Colic----neurologist Dr. Noah Delaine plans to do cystoscopy with retrogram pyelogram and ureteral stent placement on the left on 03/12/20 - Patient had a CT on May 14 showing mild hydronephrosis secondary to a 5 x 7 mm stone within the proximal left ureter about 2 cm distal to the left UPJ. Patient states she was actually seen earlier today, May 19 by Dr. Alyson Ingles but she was not having pain then.  -IV fluids, IV Toradol as needed, IV morphine as needed, -IV Zofran as needed  2) mood disorder--- currently stable, lorazepam as needed   With History of - Reviewed by me  Past Medical History:  Diagnosis Date  . Anxiety   . Asthma    childhood asthma  . Depression   . DVT (deep venous thrombosis) ([place])   . Dysrhythmia   . Kidney stones 2007   passed x2  . Post traumatic stress disorder    prior medications   . Tachycardia       Past Surgical History:  Procedure Laterality Date  . EXCISION VAGINAL CYST Left 12/24/2019   Procedure: REMOVAL OF LEFT PERICLITORAL LESION;  Surgeon: Florian Buff, MD;  Location: [facility];  Service: Gynecology;  Laterality: Left;  . TUBAL LIGATION  2013      Chief Complaint  Patient presents with  . Flank Pain      HPI:    Tracie Aguilar  is a 34 y.o. female with history of recurrent nephrolithiasis with LMP of 03/09/2020 resulting in history of low that may give  appearance of hematuria at this time presents with increasing left flank pain--not resolved by OTC pain medications -Patient has had nausea and vomiting as well, emesis is without bile or blood -.No fever  Or chills    Patient had a CT on May 14 showing mild hydronephrosis secondary to a 5 x 7 mm stone within the proximal left ureter about 2 cm distal to the left UPJ. Patient states she was actually seen earlier today, May 19 by Dr. Alyson Ingles but she was not having pain then.  -She was prescribed Flomax ibuprofen and Flexeril as well as Keflex---  -Left flank pain nausea vomiting worsened - EDP discussed case with on-call urologist Dr. Diona Fanti--- who advised admission for pain control and nausea control -Urologist plans definitive operative management on 03/12/2020  --UA with yeast, RBC due to patient's heavy menstrual flow -Creatinine is 0.8    Review of systems:    In addition to the HPI above,   A full Review of  Systems was done, all other systems reviewed are negative except as noted above in HPI , .    Social History:  Reviewed by me    Social History   Tobacco Use  . Smoking status: Current Every Day Smoker    Packs/day: 1.50    Years: 17.00    Pack years: 25.50    Types: Cigarettes    Start date: 10/23/2001  . Smokeless tobacco: Never Used  Substance Use Topics  . Alcohol use: Yes    Alcohol/week: 0.0 standard drinks    Comment: occassinal       Family History :  Reviewed by me    Family History  Problem Relation Age of Onset  . COPD Mother   . Alcohol abuse Mother   . Arthritis Mother        OA  . Hypertension Mother   . Alcohol abuse Father   . Hypertension Father   . Diabetes Father   . Lung cancer Maternal Grandmother   . Stroke Maternal Grandfather   . Hypertension Maternal Grandfather   . Diabetes Maternal Grandfather   . Stroke Paternal Grandfather   . Breast cancer Cousin   . Diabetes Cousin      Home Medications:   Prior to  Admission medications   Medication Sig Start Date End Date Taking? Authorizing Provider  acetaminophen (TYLENOL) 500 MG tablet Take 1 tablet (500 mg total) by mouth every 6 (six) hours as needed. 03/06/20  Yes Fawze, Mina A, PA-C  albuterol (VENTOLIN HFA) 108 (90 Base) MCG/ACT inhaler Inhale 2 puffs into the lungs every 6 (six) hours as needed for wheezing or shortness of breath. 12/10/19  Yes Jacquelin Hawking, PA-C  cephALEXin (KEFLEX) 500 MG capsule Take 1 capsule (500 mg total) by mouth 2 (two) times daily for 7 days. 03/06/20 03/13/20 Yes Idol, Raynelle Fanning, PA-C  cyclobenzaprine (FLEXERIL) 10 MG tablet Take 1 tablet (10 mg total) by mouth 2 (two) times daily as needed for muscle spasms. 02/23/20  Yes Fawze, Mina A, PA-C  ibuprofen (ADVIL) 200 MG tablet Take 800 mg by mouth every 8 (eight) hours as needed (for pain.).   Yes [provider]  ibuprofen (ADVIL) 600 MG tablet Take 1 tablet (600 mg total) by mouth every 6 (six) hours as needed. 03/06/20  Yes Fawze, Mina A, PA-C  Multiple Vitamins-Minerals (EMERGEN-C VITAMIN C PO) Take 750 mg by mouth daily with lunch.   Yes [provider]  Multiple Vitamins-Minerals (HAIR SKIN AND NAILS FORMULA) TABS Take 1 tablet by mouth daily.   Yes [provider]  ondansetron (ZOFRAN) 4 MG tablet Take 1 tablet (4 mg total) by mouth every 8 (eight) hours as needed for nausea or vomiting. 03/06/20  Yes Luevenia Maxin, Mina A, PA-C  tamsulosin (FLOMAX) 0.4 MG CAPS capsule Take 1 capsule (0.4 mg total) by mouth daily after supper. Patient taking differently: Take 0.4 mg by mouth daily at 2 PM.  03/06/20  Yes Fawze, Mina A, PA-C  traMADol (ULTRAM) 50 MG tablet Take 1 tablet (50 mg total) by mouth every 12 (twelve) hours as needed for severe pain. 03/06/20  Yes Fawze, Mina A, PA-C  polyethylene glycol powder (GLYCOLAX/MIRALAX) 17 GM/SCOOP powder Take 17 g by mouth daily. Patient not taking: Reported on 03/10/2020 03/06/20   Burgess Amor, PA-C     Allergies:       Allergies  Allergen Reactions  . Sulfa Antibiotics Hives     Physical Exam:   Vitals  Blood pressure 106/63,  pulse 75, temperature 98.2 F (36.8 C), temperature source Oral, resp. rate 16, height 5\' 3"  (1.6 m), weight 95.3 kg, SpO2 98 %.  Physical Examination: General appearance - alert, uncomfortable with pain  mental status - alert, oriented to person, place, and time,  Eyes - sclera anicteric Neck - supple, no JVD elevation , Chest - clear  to auscultation bilaterally, symmetrical air movement,  Heart - S1 and S2 normal, regular Abdomen - soft, abdomen is nondistended, there is left flank tenderness,  neurological - screening mental status exam normal, neck supple without rigidity, cranial nerves II through XII intact, DTR's normal and symmetric Extremities - no pedal edema noted, intact peripheral pulses  Skin - warm, dry     Data Review:    CBC Recent Labs  Lab 03/05/20 2148 03/06/20 1904  WBC 12.8* 11.3*  HGB 12.6 12.7  HCT 38.2 38.5  PLT 305 299  MCV 90.1 91.9  MCH 29.7 30.3  MCHC 33.0 33.0  RDW 12.8 13.1  LYMPHSABS 2.5 3.0  MONOABS 0.8 0.8  EOSABS 0.4 0.5  BASOSABS 0.1 0.1   ------------------------------------------------------------------------------------------------------------------  Chemistries  Recent Labs  Lab 03/05/20 2148 03/06/20 1904 03/11/20 0216  NA 136 136 135  K 3.7 3.8 3.8  CL 103 103 104  CO2 24 24 23   GLUCOSE 108* 77 111*  BUN 15 13 15   CREATININE 0.68 0.58 0.78  CALCIUM 8.4* 8.5* 8.6*  AST 31  --   --   ALT 41  --   --   ALKPHOS 78  --   --   BILITOT 0.2*  --   --    ------------------------------------------------------------------------------------------------------------------ estimated creatinine clearance is 108.9 mL/min (by C-G formula based on SCr of 0.78 mg/dL). ------------------------------------------------------------------------------------------------------------------ No results for input(s): TSH,  T4TOTAL, T3FREE, THYROIDAB in the last 72 hours.  Invalid input(s): FREET3   Coagulation profile No results for input(s): INR, PROTIME in the last 168 hours. ------------------------------------------------------------------------------------------------------------------- No results for input(s): DDIMER in the last 72 hours. -------------------------------------------------------------------------------------------------------------------  Cardiac Enzymes No results for input(s): CKMB, TROPONINI, MYOGLOBIN in the last 168 hours.  Invalid input(s): CK ------------------------------------------------------------------------------------------------------------------ No results found for: BNP   ---------------------------------------------------------------------------------------------------------------  Urinalysis    Component Value Date/Time   COLORURINE YELLOW 03/11/2020 0022   APPEARANCEUR HAZY (A) 03/11/2020 0022   LABSPEC 1.024 03/11/2020 0022   PHURINE 6.0 03/11/2020 0022   GLUCOSEU NEGATIVE 03/11/2020 0022   HGBUR LARGE (A) 03/11/2020 0022   BILIRUBINUR NEGATIVE 03/11/2020 0022   BILIRUBINUR neg 03/10/2020 1052   KETONESUR NEGATIVE 03/11/2020 0022   PROTEINUR 30 (A) 03/11/2020 0022   UROBILINOGEN 0.2 03/10/2020 1052   UROBILINOGEN 0.2 05/24/2015 1250   NITRITE NEGATIVE 03/11/2020 0022   LEUKOCYTESUR NEGATIVE 03/11/2020 0022    ----------------------------------------------------------------------------------------------------------------   Imaging Results:    No results found.  Radiological Exams on Admission: No results found.  DVT Prophylaxis -SCD   AM Labs Ordered, also please review Full Orders  Family Communication: Admission, patients condition and plan of care including tests being ordered have been discussed with the patient * who indicate understanding and agree with the plan   Code Status - Full Code  Likely DC to  --Home after cystoscopy with  stent placement possibly in a day or 2  Condition   stable  07/24/2015 M.D on 03/11/2020 at 7:25 PM Go to [email or web address] -  for contact info  [facility] - Office  [phone]

## 2020-03-11 NOTE — Plan of Care (Signed)

## 2020-03-12 ENCOUNTER — Observation Stay (HOSPITAL_COMMUNITY): Payer: Self-pay | Admitting: Anesthesiology

## 2020-03-12 ENCOUNTER — Ambulatory Visit (HOSPITAL_COMMUNITY): Admission: RE | Admit: 2020-03-12 | Payer: Medicaid Other | Source: Home / Self Care | Admitting: Urology

## 2020-03-12 ENCOUNTER — Encounter (HOSPITAL_COMMUNITY): Payer: Self-pay | Admitting: Family Medicine

## 2020-03-12 ENCOUNTER — Encounter (HOSPITAL_COMMUNITY)
Admission: EM | Disposition: A | Discharge status: Home / Self Care | Payer: Self-pay | Source: Home / Self Care | Attending: Emergency Medicine

## 2020-03-12 ENCOUNTER — Observation Stay (HOSPITAL_COMMUNITY): Payer: Self-pay

## 2020-03-12 DIAGNOSIS — N201 Calculus of ureter: Secondary | ICD-10-CM

## 2020-03-12 HISTORY — PX: HOLMIUM LASER APPLICATION: SHX5852

## 2020-03-12 HISTORY — PX: CYSTOSCOPY WITH RETROGRADE PYELOGRAM, URETEROSCOPY AND STENT PLACEMENT: SHX5789

## 2020-03-12 LAB — BASIC METABOLIC PANEL
Anion gap: 8 (ref 5–15)
BUN: 15 mg/dL (ref 6–20)
CO2: 22 mmol/L (ref 22–32)
Calcium: 8.2 mg/dL — ABNORMAL LOW (ref 8.9–10.3)
Chloride: 108 mmol/L (ref 98–111)
Creatinine, Ser: 0.58 mg/dL (ref 0.44–1.00)
GFR calc Af Amer: >60 mL/min (ref >60)
GFR calc non Af Amer: >60 mL/min (ref >60)
Glucose, Bld: 97 mg/dL (ref 70–99)
Potassium: 4.1 mmol/L (ref 3.5–5.1)
Sodium: 138 mmol/L (ref 135–145)

## 2020-03-12 LAB — CBC
HCT: 36.4 % (ref 36.0–46.0)
Hemoglobin: 11.9 g/dL — ABNORMAL LOW (ref 12.0–15.0)
MCH: 29.9 pg (ref 26.0–34.0)
MCHC: 32.7 g/dL (ref 30.0–36.0)
MCV: 91.5 fL (ref 80.0–100.0)
Platelets: 292 10*3/uL (ref 150–400)
RBC: 3.98 MIL/uL (ref 3.87–5.11)
RDW: 13 % (ref 11.5–15.5)
WBC: 7.8 10*3/uL (ref 4.0–10.5)
nRBC: 0 % (ref 0.0–0.2)

## 2020-03-12 SURGERY — CYSTOURETEROSCOPY, WITH RETROGRADE PYELOGRAM AND STENT INSERTION
Anesthesia: General | Site: Ureter | Laterality: Left

## 2020-03-12 MED ORDER — DIATRIZOATE MEGLUMINE 30 % UR SOLN
URETHRAL | Status: AC
  Filled 2020-03-12: qty 100

## 2020-03-12 MED ORDER — CHLORHEXIDINE GLUCONATE 0.12 % MT SOLN
15.0000 mL | Freq: Once | OROMUCOSAL | Status: AC
  Administered 2020-03-12: 15 mL via OROMUCOSAL

## 2020-03-12 MED ORDER — DIATRIZOATE MEGLUMINE 30 % UR SOLN
URETHRAL | Status: DC | PRN
  Administered 2020-03-12: 10 mL via URETHRAL

## 2020-03-12 MED ORDER — ORAL CARE MOUTH RINSE: 15.0000 mL | Freq: Once | OROMUCOSAL | Status: AC

## 2020-03-12 MED ORDER — MIDAZOLAM HCL 2 MG/2ML IJ SOLN
INTRAMUSCULAR | Status: DC | PRN
  Administered 2020-03-12: 2 mg via INTRAVENOUS

## 2020-03-12 MED ORDER — CEFAZOLIN SODIUM-DEXTROSE 2-4 GM/100ML-% IV SOLN
2.0000 g | INTRAVENOUS | Status: AC
  Administered 2020-03-12: 2 g via INTRAVENOUS

## 2020-03-12 MED ORDER — HYDROMORPHONE HCL 1 MG/ML IJ SOLN: 0.2500 mg | INTRAMUSCULAR | Status: DC | PRN

## 2020-03-12 MED ORDER — STERILE WATER FOR IRRIGATION IR SOLN
Status: DC | PRN
  Administered 2020-03-12: 1

## 2020-03-12 MED ORDER — SODIUM CHLORIDE 0.9 % IR SOLN
Status: DC | PRN
  Administered 2020-03-12: 3000 mL via INTRAVESICAL

## 2020-03-12 MED ORDER — PROPOFOL 10 MG/ML IV BOLUS
INTRAVENOUS | Status: DC | PRN
  Administered 2020-03-12: 180 mg via INTRAVENOUS

## 2020-03-12 MED ORDER — LACTATED RINGERS IV SOLN
INTRAVENOUS | Status: DC
  Administered 2020-03-12: 1000 mL via INTRAVENOUS

## 2020-03-12 MED ORDER — TRAMADOL HCL 50 MG PO TABS: 50.0000 mg | ORAL_TABLET | Freq: Two times a day (BID) | ORAL | 0 refills | Status: DC | PRN

## 2020-03-12 MED ORDER — ONDANSETRON HCL 4 MG/2ML IJ SOLN
INTRAMUSCULAR | Status: DC | PRN
  Administered 2020-03-12: 4 mg via INTRAVENOUS

## 2020-03-12 MED ORDER — FENTANYL CITRATE (PF) 100 MCG/2ML IJ SOLN
INTRAMUSCULAR | Status: DC | PRN
  Administered 2020-03-12 (×2): 50 ug via INTRAVENOUS

## 2020-03-12 MED ORDER — LIDOCAINE HCL (CARDIAC) PF 50 MG/5ML IV SOSY
PREFILLED_SYRINGE | INTRAVENOUS | Status: DC | PRN
  Administered 2020-03-12: 80 mg via INTRAVENOUS

## 2020-03-12 MED ORDER — CEFAZOLIN SODIUM-DEXTROSE 2-4 GM/100ML-% IV SOLN
INTRAVENOUS | Status: AC
  Filled 2020-03-12: qty 100

## 2020-03-12 SURGICAL SUPPLY — 26 items
BAG DRAIN URO TABLE W/ADPT NS (BAG) ×3 IMPLANT
BAG HAMPER (MISCELLANEOUS) ×3 IMPLANT
CATH INTERMIT  6FR 70CM (CATHETERS) ×3 IMPLANT
CLOTH BEACON ORANGE TIMEOUT ST (SAFETY) ×3 IMPLANT
DECANTER SPIKE VIAL GLASS SM (MISCELLANEOUS) ×3 IMPLANT
EXTRACTOR STONE NITINOL NGAGE (UROLOGICAL SUPPLIES) ×3 IMPLANT
FIBER LASER FLEXIVA 200 (UROLOGICAL SUPPLIES) ×3 IMPLANT
GLOVE BIO SURGEON STRL SZ8 (GLOVE) ×3 IMPLANT
GLOVE BIOGEL PI IND STRL 7.0 (GLOVE) ×4 IMPLANT
GLOVE BIOGEL PI INDICATOR 7.0 (GLOVE) ×2
GOWN STRL REUS W/TWL LRG LVL3 (GOWN DISPOSABLE) ×3 IMPLANT
GOWN STRL REUS W/TWL XL LVL3 (GOWN DISPOSABLE) ×3 IMPLANT
GUIDEWIRE ANG ZIPWIRE 038X150 (WIRE) ×3 IMPLANT
GUIDEWIRE STR DUAL SENSOR (WIRE) ×3 IMPLANT
GUIDEWIRE STR ZIPWIRE 035X150 (MISCELLANEOUS) ×3 IMPLANT
IV NS IRRIG 3000ML ARTHROMATIC (IV SOLUTION) ×6 IMPLANT
KIT TURNOVER CYSTO (KITS) ×3 IMPLANT
LASER FIBER DISP (UROLOGICAL SUPPLIES) ×3 IMPLANT
MANIFOLD NEPTUNE II (INSTRUMENTS) ×3 IMPLANT
PAD ARMBOARD 7.5X6 YLW CONV (MISCELLANEOUS) ×3 IMPLANT
STENT URET 6FRX24 CONTOUR (STENTS) ×3 IMPLANT
SYR 10ML LL (SYRINGE) ×3 IMPLANT
TOWEL NATURAL 4PK STERILE (DISPOSABLE) ×3 IMPLANT
TOWEL OR 17X26 4PK STRL BLUE (TOWEL DISPOSABLE) ×3 IMPLANT
TRAY CYSTO PACK (CUSTOM PROCEDURE TRAY) ×3 IMPLANT
WATER STERILE IRR 500ML POUR (IV SOLUTION) ×3 IMPLANT

## 2020-03-12 NOTE — Anesthesia Preprocedure Evaluation (Signed)
Anesthesia Evaluation  Patient identified by MRN, date of birth, ID band Patient awake    Reviewed: Allergy & Precautions, H&P , NPO status , Patient's Chart, lab work & pertinent test results, reviewed documented beta blocker date and time   Airway Mallampati: II  TM Distance: >3 FB Neck ROM: full    Dental no notable dental hx. (+) Teeth Intact   Pulmonary asthma , Current Smoker,    Pulmonary exam normal breath sounds clear to auscultation       Cardiovascular Exercise Tolerance: Good negative cardio ROS   Rhythm:regular Rate:Normal     Neuro/Psych PSYCHIATRIC DISORDERS Anxiety Depression negative neurological ROS     GI/Hepatic negative GI ROS, Neg liver ROS,   Endo/Other  negative endocrine ROS  Renal/GU Renal disease  negative genitourinary   Musculoskeletal   Abdominal   Peds  Hematology negative hematology ROS (+)   Anesthesia Other Findings   Reproductive/Obstetrics negative OB ROS                             Anesthesia Physical Anesthesia Plan  ASA: III  Anesthesia Plan: General   Post-op Pain Management:    Induction:   PONV Risk Score and Plan: 3 and Ondansetron  Airway Management Planned:   Additional Equipment:   Intra-op Plan:   Post-operative Plan:   Informed Consent: I have reviewed the patients History and Physical, chart, labs and discussed the procedure including the risks, benefits and alternatives for the proposed anesthesia with the patient or authorized representative who has indicated his/her understanding and acceptance.     Dental Advisory Given  Plan Discussed with: CRNA  Anesthesia Plan Comments:         Anesthesia Quick Evaluation

## 2020-03-12 NOTE — Addendum Note (Signed)
Addendum  created 03/12/20 1353 by Moshe Salisbury, CRNA   Clinical Note Signed, Intraprocedure Blocks edited, LDA updated via procedure documentation

## 2020-03-12 NOTE — Discharge Summary (Signed)
Physician Discharge Summary  Patient ID: Tracie Aguilar MRN: [id number] DOB/AGE: 1986-05-15 34 y.o.  Admit date: 03/11/2020 Discharge date: 03/12/2020  Admission Diagnoses:  Nephrolithiasis  Discharge Diagnoses:  Principal Problem:   Nephrolithiasis Active Problems:   PTSD (post-traumatic stress disorder)   Major depressive disorder, recurrent severe without psychotic features [facility])   Past Medical History:  Diagnosis Date  . Anxiety   . Asthma    childhood asthma  . Depression   . DVT (deep venous thrombosis) (HCC)   . Dysrhythmia   . Kidney stones 2007   passed x2  . Post traumatic stress disorder    prior medications   . Tachycardia     Surgeries: Procedure(s): CYSTOSCOPY WITH LEFT RETROGRADE PYELOGRAM, LEFT URETEROSCOPY AND LEFT URETERAL STENT PLACEMENT HOLMIUM LASER LITHOTRIPSY on 03/12/2020   Consultants (if any): Treatment Team:  Tracie Aguilar Tracie Celeste, MD Tracie Hale, MD  Discharged Condition: Improved  Hospital Course: Tracie Aguilar is an 34 y.o. female who was admitted 03/11/2020 with a diagnosis of Nephrolithiasis and went to the operating room on 03/12/2020 and underwent the above named procedures.    She was given perioperative antibiotics:  Anti-infectives (From admission, onward)   Start     Dose/Rate Route Frequency Ordered Stop   03/12/20 1152  ceFAZolin (ANCEF) IVPB 2g/100 mL premix     2 g 200 mL/hr over 30 Minutes Intravenous 30 min pre-op 03/12/20 1152 03/12/20 1259    .  She was given sequential compression devices, early ambulation for DVT prophylaxis.  She benefited maximally from the hospital stay and there were no complications.    Recent vital signs:  Vitals:   03/12/20 1352 03/12/20 1400  BP: 116/72 110/77  Pulse: 77 66  Resp: 11 12  Temp: 98 F (36.7 C)   SpO2: 97% 95%    Recent laboratory studies:  Lab Results  Component Value Date   HGB 11.9 (L) 03/12/2020   HGB 12.7 03/06/2020   HGB 12.6 03/05/2020   Lab  Results  Component Value Date   WBC 7.8 03/12/2020   PLT 292 03/12/2020   No results found for: INR Lab Results  Component Value Date   NA 138 03/12/2020   K 4.1 03/12/2020   CL 108 03/12/2020   CO2 22 03/12/2020   BUN 15 03/12/2020   CREATININE 0.58 03/12/2020   GLUCOSE 97 03/12/2020    Discharge Medications:   Allergies as of 03/12/2020      Reactions   Sulfa Antibiotics Hives      Medication List    TAKE these medications   acetaminophen 500 MG tablet Commonly known as: TYLENOL Take 1 tablet (500 mg total) by mouth every 6 (six) hours as needed.   albuterol 108 (90 Base) MCG/ACT inhaler Commonly known as: VENTOLIN HFA Inhale 2 puffs into the lungs every 6 (six) hours as needed for wheezing or shortness of breath.   cephALEXin 500 MG capsule Commonly known as: KEFLEX Take 1 capsule (500 mg total) by mouth 2 (two) times daily for 7 days.   cyclobenzaprine 10 MG tablet Commonly known as: FLEXERIL Take 1 tablet (10 mg total) by mouth 2 (two) times daily as needed for muscle spasms.   Hair Skin and Nails Formula Tabs Take 1 tablet by mouth daily.   EMERGEN-C VITAMIN C PO Take 750 mg by mouth daily with lunch.   ibuprofen 200 MG tablet Commonly known as: ADVIL Take 800 mg by mouth every 8 (eight) hours as needed (for  pain.).   ibuprofen 600 MG tablet Commonly known as: ADVIL Take 1 tablet (600 mg total) by mouth every 6 (six) hours as needed.   ondansetron 4 MG tablet Commonly known as: ZOFRAN Take 1 tablet (4 mg total) by mouth every 8 (eight) hours as needed for nausea or vomiting.   polyethylene glycol powder 17 GM/SCOOP powder Commonly known as: GLYCOLAX/MIRALAX Take 17 g by mouth daily.   tamsulosin 0.4 MG Caps capsule Commonly known as: FLOMAX Take 1 capsule (0.4 mg total) by mouth daily after supper. What changed: when to take this   traMADol 50 MG tablet Commonly known as: ULTRAM Take 1 tablet (50 mg total) by mouth every 12 (twelve) hours as  needed for severe pain.       Diagnostic Studies: DG Abdomen 1 View  Result Date: 03/06/2020 CLINICAL DATA:  Constipation, history of left ureteral stone EXAM: ABDOMEN - 1 VIEW COMPARISON:  None. FINDINGS: Scattered large and small bowel gas is noted. Considerable retained fecal material is noted consistent with constipation. Known proximal left ureteral stone is again visualized just below the L2 transverse process on the left. Changes of prior tubal ligation are noted. No bony abnormality is seen. IMPRESSION: Stable left ureteral stone. Changes of constipation. Electronically Signed   By: Alcide Clever M.D.   On: 03/06/2020 20:09   CT Head Wo Contrast  Result Date: 02/23/2020 CLINICAL DATA:  Headache, acute, normal neuro exam. Additional history provided: Patient reports headache to left side of head, associated eye watering, dizziness. EXAM: CT HEAD WITHOUT CONTRAST TECHNIQUE: Contiguous axial images were obtained from the base of the skull through the vertex without intravenous contrast. COMPARISON:  No pertinent prior studies available for comparison. FINDINGS: Brain: There is no acute intracranial hemorrhage. No demarcated cortical infarct. No extra-axial fluid collection. No evidence of intracranial mass. No midline shift. Partially empty sella turcica. Vascular: No hyperdense vessel. Skull: Normal. Negative for fracture or focal lesion. Sinuses/Orbits: Visualized orbits show no acute finding. No significant paranasal sinus disease or mastoid effusion at the imaged levels. IMPRESSION: Partially empty sella turcica. This is very commonly an incidental finding, but may be seen in the setting of idiopathic intracranial hypertension. Otherwise, unremarkable head CT without evidence of acute intracranial abnormality. Electronically Signed   By: Jackey Loge DO   On: 02/23/2020 16:59   CT Lumbar Spine Wo Contrast  Result Date: 02/23/2020 CLINICAL DATA:  Central low back pain for 1 month with headaches.  EXAM: CT LUMBAR SPINE WITHOUT CONTRAST TECHNIQUE: Multidetector CT imaging of the lumbar spine was performed without intravenous contrast administration. Multiplanar CT image reconstructions were also generated. COMPARISON:  Lumbar spine radiographs 02/14/2016. FINDINGS: Segmentation: There are 5 lumbar type vertebral bodies. Alignment: Normal. Vertebrae: No evidence of acute fracture or traumatic subluxation. Endplate sclerosis and osteophytes are present at L5-S1. Paraspinal and other soft tissues: No acute paraspinal findings. There are nonobstructing bilateral renal calculi. No evidence of ureteral calculus or hydronephrosis. Disc levels: From T12-L1 through L4-5, the disc height is preserved, and there is no evidence of disc herniation or significant spinal stenosis. L5-S1: Mild loss of disc height with disc bulging, endplate sclerosis and osteophytes. Mild bilateral facet hypertrophy. Mild osseous inferior foraminal narrowing on the left without definite L5 nerve root encroachment. IMPRESSION: 1. No acute findings or explanation for the patient's symptoms. 2. Mild spondylosis at L5-S1 with mild osseous inferior foraminal narrowing on the left. No definite L5 nerve root encroachment. 3. Nonobstructing bilateral renal calculi. Electronically Signed  By: Richardean Sale M.D.   On: 02/23/2020 16:56   CT ABDOMEN PELVIS W CONTRAST  Result Date: 03/05/2020 CLINICAL DATA:  Left upper and lower quadrant pain EXAM: CT ABDOMEN AND PELVIS WITH CONTRAST TECHNIQUE: Multidetector CT imaging of the abdomen and pelvis was performed using the standard protocol following bolus administration of intravenous contrast. CONTRAST:  115mL OMNIPAQUE IOHEXOL 300 MG/ML  SOLN COMPARISON:  02/23/2020 FINDINGS: Lower chest: Lung bases demonstrate no acute consolidation or effusion. Hepatobiliary: No focal liver abnormality is seen. No gallstones, gallbladder wall thickening, or biliary dilatation. Pancreas: Unremarkable. No pancreatic  ductal dilatation or surrounding inflammatory changes. Spleen: Normal in size without focal abnormality. Adrenals/Urinary Tract: Adrenal glands are normal. Interim development of mild left hydronephrosis, secondary to a 4 x 7 mm stone within the proximal left ureter about 2 cm distal to the left UPJ. Bladder is unremarkable. Punctate stone lower pole right kidney. Stomach/Bowel: Stomach is within normal limits. Appendix appears normal. No evidence of bowel wall thickening, distention, or inflammatory changes. Vascular/Lymphatic: No significant vascular findings are present. No enlarged abdominal or pelvic lymph nodes. Reproductive: Bilateral tubal ligation clips.  No adnexal mass. Other: No abdominal wall hernia or abnormality. No abdominopelvic ascites. Musculoskeletal: No acute or significant osseous findings. IMPRESSION: 1. Interval mild left hydronephrosis, secondary to a 5 x 7 mm stone within the proximal left ureter about 2 cm distal to the left UPJ. 2. Otherwise no CT evidence for acute intra-abdominal or pelvic abnormality Electronically Signed   By: Donavan Foil M.D.   On: 03/05/2020 23:22   DG C-Arm 1-60 Min-No Report  Result Date: 03/12/2020 Fluoroscopy was utilized by the requesting physician.  No radiographic interpretation.    Disposition: Discharge disposition: 01-Home or Self Care       Discharge Instructions    Discharge patient   Complete by: As directed    Discharge disposition: 01-Home or Self Care   Discharge patient date: 03/12/2020      Follow-up Information    Saylor Sheckler, Candee Furbish, MD. Call in 1 week.   Specialty: Urology Contact information: [address] [place] [address] [phone]            Signed: Nicolette Bang 03/12/2020, 2:20 PM

## 2020-03-12 NOTE — Discharge Instructions (Signed)

## 2020-03-12 NOTE — Progress Notes (Addendum)
  Discussed with Pt's Urologist-- Dr Ronne Binning  Pt transferred to Dr. Dimas Millin Urology service   Hospitalist did Not round on pt on 03/12/20  Dr. Ronne Binning plans to discharge pt home himself post procedure today  Dr. Ronne Binning will recall/reconsult [facility] service if any medical issues arise  -This a No charge Note  Shon Hale, MD

## 2020-03-12 NOTE — Anesthesia Procedure Notes (Addendum)
Procedure Name: LMA Insertion Date/Time: 03/12/2020 12:57 PM Performed by: Moshe Salisbury, CRNA Pre-anesthesia Checklist: Patient identified, Patient being monitored, Emergency Drugs available, Timeout performed and Suction available Patient Re-evaluated:Patient Re-evaluated prior to induction Oxygen Delivery Method: Circle System Utilized Preoxygenation: Pre-oxygenation with 100% oxygen Induction Type: IV induction Ventilation: Mask ventilation without difficulty LMA: LMA inserted LMA Size: 3.0 Number of attempts: 1 Placement Confirmation: positive ETCO2 and breath sounds checked- equal and bilateral

## 2020-03-12 NOTE — Transfer of Care (Signed)
Immediate Anesthesia Transfer of Care Note  Patient: Tracie Aguilar  Procedure(s) Performed: CYSTOSCOPY WITH LEFT RETROGRADE PYELOGRAM, LEFT URETEROSCOPY AND LEFT URETERAL STENT PLACEMENT (Left Ureter) HOLMIUM LASER LITHOTRIPSY (Left Ureter)  Patient Location: PACU  Anesthesia Type:General  Level of Consciousness: awake  Airway & Oxygen Therapy: Patient Spontanous Breathing  Post-op Assessment: Report given to RN  Post vital signs: Reviewed and stable  Last Vitals:  Vitals Value Taken Time  BP    Temp    Pulse 89 03/12/20 1348  Resp 17 03/12/20 1348  SpO2 99 % 03/12/20 1348  Vitals shown include unvalidated device data.  Last Pain:  Vitals:   03/12/20 1149  TempSrc: Oral  PainSc: 6       Patients Stated Pain Goal: 6 (03/12/20 1149)  Complications: No apparent anesthesia complications

## 2020-03-12 NOTE — Interval H&P Note (Signed)
History and Physical Interval Note:  03/12/2020 12:46 PM  Tracie Aguilar  has presented today for surgery, with the diagnosis of left ureteral calculus.  The various methods of treatment have been discussed with the patient and family. After consideration of risks, benefits and other options for treatment, the patient has consented to  Procedure(s): CYSTOSCOPY WITH LEFT RETROGRADE PYELOGRAM, LEFT URETEROSCOPY AND LEFT URETERAL STENT PLACEMENT HOLMIUM LASER LITHOTRIPSY as a surgical intervention.  The patient's history has been reviewed, patient examined, no change in status, stable for surgery.  I have reviewed the patient's chart and labs.  Questions were answered to the patient's satisfaction.     Wilkie Aye

## 2020-03-12 NOTE — Op Note (Signed)
.  Preoperative diagnosis: Left ureteral stone  Postoperative diagnosis: Same  Procedure: 1 cystoscopy 2. Left retrograde pyelography 3.  Intraoperative fluoroscopy, under one hour, with interpretation 4.  Left ureteroscopic stone manipulation with laser lithotripsy 5.  Left 6 x 24 JJ stent placement  Attending: Cleda Mccreedy  Anesthesia: General  Estimated blood loss: None  Drains: Left 6 x 24 JJ ureteral stent without tether  Specimens: stone for analysis  Antibiotics: ancef  Findings: left distal ureteral stone. Moderate hydronephrosis. No masses/lesions in the bladder. Ureteral orifices in normal anatomic location.  Indications: Patient is a 34 year old female with a history of left ureteral stone and who has failed medical expulsive therapy.  After discussing treatment options, she decided proceed with left ureteroscopic stone manipulation.  Procedure her in detail: The patient was brought to the operating room and a brief timeout was done to ensure correct patient, correct procedure, correct site.  General anesthesia was administered patient was placed in dorsal lithotomy position.  Her genitalia was then prepped and draped in usual sterile fashion.  A rigid 22 French cystoscope was passed in the urethra and the bladder.  Bladder was inspected free masses or lesions.  the ureteral orifices were in the normal orthotopic locations.  a 6 french ureteral catheter was then instilled into the left ureteral orifice.  a gentle retrograde was obtained and findings noted above.  we then placed a zip wire through the ureteral catheter and advanced up to the renal pelvis.  we then removed the cystoscope and cannulated the left ureteral orifice with a semirigid ureteroscope.  We encountered the calculus in the distal ureter. Using a 200 nm laser fiber and fragmented the stone into smaller pieces.  the pieces were then removed with a engage basket. once all stone fragments were removed we then  placed a 6 x 26 double-j ureteral stent over the original zip wire.   We then removed the wire and good coil was noted in the the renal pelvis under fluoroscopy and the bladder under direct vision.   the stone fragments were then removed from the bladder and sent for analysis.   the bladder was then drained and this concluded the procedure which was well tolerated by patient.  Complications: None  Condition: Stable, extubated, transferred to PACU  Plan: Patient is to be discharged home as to follow-up in one week for stent removal.

## 2020-03-12 NOTE — Plan of Care (Signed)
  Problem: Education: Goal: Knowledge of General Education information will improve Description: Including pain rating scale, medication(s)/side effects and non-pharmacologic comfort measures Outcome: Progressing   Problem: Clinical Measurements: Goal: Ability to maintain clinical measurements within normal limits will improve Outcome: Progressing   Problem: Clinical Measurements: Goal: Will remain free from infection Outcome: Progressing   Problem: Clinical Measurements: Goal: Cardiovascular complication will be avoided Outcome: Progressing   Problem: Clinical Measurements: Goal: Respiratory complications will improve Outcome: Progressing   Problem: Pain Managment: Goal: General experience of comfort will improve Outcome: Progressing   Problem: Safety: Goal: Ability to remain free from injury will improve Outcome: Progressing

## 2020-03-12 NOTE — Anesthesia Postprocedure Evaluation (Signed)
Anesthesia Post Note  Patient: ENDYA AUSTIN  Procedure(s) Performed: CYSTOSCOPY WITH LEFT RETROGRADE PYELOGRAM, LEFT URETEROSCOPY AND LEFT URETERAL STENT PLACEMENT (Left Ureter) HOLMIUM LASER LITHOTRIPSY (Left Ureter)  Patient location during evaluation: PACU Anesthesia Type: General Level of consciousness: awake and alert Pain management: pain level controlled Vital Signs Assessment: post-procedure vital signs reviewed and stable Respiratory status: spontaneous breathing Cardiovascular status: blood pressure returned to baseline and stable Postop Assessment: no apparent nausea or vomiting Anesthetic complications: no     Last Vitals:  Vitals:   03/12/20 0437 03/12/20 1149  BP: 109/69 111/63  Pulse: 68 71  Resp: 16 15  Temp: 36.9 C 36.8 C  SpO2: 95% 98%    Last Pain:  Vitals:   03/12/20 1149  TempSrc: Oral  PainSc: 6                  Latisha Lasch

## 2020-03-16 ENCOUNTER — Ambulatory Visit: Payer: Medicaid Other | Admitting: Physician Assistant

## 2020-03-17 ENCOUNTER — Other Ambulatory Visit: Payer: Self-pay

## 2020-03-17 ENCOUNTER — Encounter: Payer: Self-pay | Admitting: Urology

## 2020-03-17 ENCOUNTER — Ambulatory Visit (INDEPENDENT_AMBULATORY_CARE_PROVIDER_SITE_OTHER): Payer: Medicaid Other | Admitting: Urology

## 2020-03-17 VITALS — BP 107/71 | HR 83 | Temp 97.9°F | Ht 63.0 in | Wt 215.0 lb

## 2020-03-17 DIAGNOSIS — N132 Hydronephrosis with renal and ureteral calculous obstruction: Secondary | ICD-10-CM

## 2020-03-17 LAB — POCT URINALYSIS DIPSTICK
Bilirubin, UA: NEGATIVE
Glucose, UA: NEGATIVE
Ketones, UA: NEGATIVE
Nitrite, UA: NEGATIVE
Protein, UA: POSITIVE — AB
Spec Grav, UA: 1.01 (ref 1.010–1.025)
Urobilinogen, UA: 0.2 E.U./dL
pH, UA: 7.5 (ref 5.0–8.0)

## 2020-03-17 MED ORDER — CIPROFLOXACIN HCL 500 MG PO TABS
500.0000 mg | ORAL_TABLET | Freq: Once | ORAL | Status: AC
  Administered 2020-03-17: 500 mg via ORAL

## 2020-03-17 NOTE — Patient Instructions (Signed)
Dietary Guidelines to Help Prevent Kidney Stones Kidney stones are deposits of minerals and salts that form inside your kidneys. Your risk of developing kidney stones may be greater depending on your diet, your lifestyle, the medicines you take, and whether you have certain medical conditions. Most people can reduce their chances of developing kidney stones by following the instructions below. Depending on your overall health and the type of kidney stones you tend to develop, your dietitian may give you more specific instructions. What are tips for following this plan? Reading food labels  Choose foods with "no salt added" or "low-salt" labels. Limit your sodium intake to less than 1500 mg per day.  Choose foods with calcium for each meal and snack. Try to eat about 300 mg of calcium at each meal. Foods that contain 200-500 mg of calcium per serving include: ? 8 oz (237 ml) of milk, fortified nondairy milk, and fortified fruit juice. ? 8 oz (237 ml) of kefir, yogurt, and soy yogurt. ? 4 oz (118 ml) of tofu. ? 1 oz of cheese. ? 1 cup (300 g) of dried figs. ? 1 cup (91 g) of cooked broccoli. ? 1-3 oz can of sardines or mackerel.  Most people need 1000 to 1500 mg of calcium each day. Talk to your dietitian about how much calcium is recommended for you. Shopping  Buy plenty of fresh fruits and vegetables. Most people do not need to avoid fruits and vegetables, even if they contain nutrients that may contribute to kidney stones.  When shopping for convenience foods, choose: ? Whole pieces of fruit. ? Premade salads with dressing on the side. ? Low-fat fruit and yogurt smoothies.  Avoid buying frozen meals or prepared deli foods.  Look for foods with live cultures, such as yogurt and kefir. Cooking  Do not add salt to food when cooking. Place a salt shaker on the table and allow each person to add his or her own salt to taste.  Use vegetable protein, such as beans, textured vegetable  protein (TVP), or tofu instead of meat in pasta, casseroles, and soups. Meal planning   Eat less salt, if told by your dietitian. To do this: ? Avoid eating processed or premade food. ? Avoid eating fast food.  Eat less animal protein, including cheese, meat, poultry, or fish, if told by your dietitian. To do this: ? Limit the number of times you have meat, poultry, fish, or cheese each week. Eat a diet free of meat at least 2 days a week. ? Eat only one serving each day of meat, poultry, fish, or seafood. ? When you prepare animal protein, cut pieces into small portion sizes. For most meat and fish, one serving is about the size of one deck of cards.  Eat at least 5 servings of fresh fruits and vegetables each day. To do this: ? Keep fruits and vegetables on hand for snacks. ? Eat 1 piece of fruit or a handful of berries with breakfast. ? Have a salad and fruit at lunch. ? Have two kinds of vegetables at dinner.  Limit foods that are high in a substance called oxalate. These include: ? Spinach. ? Rhubarb. ? Beets. ? Potato chips and french fries. ? Nuts.  If you regularly take a diuretic medicine, make sure to eat at least 1-2 fruits or vegetables high in potassium each day. These include: ? Avocado. ? Banana. ? Orange, prune, carrot, or tomato juice. ? Baked potato. ? Cabbage. ? Beans and split   peas. General instructions   Drink enough fluid to keep your urine clear or pale yellow. This is the most important thing you can do.  Talk to your health care provider and dietitian about taking daily supplements. Depending on your health and the cause of your kidney stones, you may be advised: ? Not to take supplements with vitamin C. ? To take a calcium supplement. ? To take a daily probiotic supplement. ? To take other supplements such as magnesium, fish oil, or vitamin B6.  Take all medicines and supplements as told by your health care provider.  Limit alcohol intake to no  more than 1 drink a day for nonpregnant women and 2 drinks a day for men. One drink equals 12 oz of beer, 5 oz of wine, or 1 oz of hard liquor.  Lose weight if told by your health care provider. Work with your dietitian to find strategies and an eating plan that works best for you. What foods are not recommended? Limit your intake of the following foods, or as told by your dietitian. Talk to your dietitian about specific foods you should avoid based on the type of kidney stones and your overall health. Grains Breads. Bagels. Rolls. Baked goods. Salted crackers. Cereal. Pasta. Vegetables Spinach. Rhubarb. Beets. Canned vegetables. Pickles. Olives. Meats and other protein foods Nuts. Nut butters. Large portions of meat, poultry, or fish. Salted or cured meats. Deli meats. Hot dogs. Sausages. Dairy Cheese. Beverages Regular soft drinks. Regular vegetable juice. Seasonings and other foods Seasoning blends with salt. Salad dressings. Canned soups. Soy sauce. Ketchup. Barbecue sauce. Canned pasta sauce. Casseroles. Pizza. Lasagna. Frozen meals. Potato chips. French fries. Summary  You can reduce your risk of kidney stones by making changes to your diet.  The most important thing you can do is drink enough fluid. You should drink enough fluid to keep your urine clear or pale yellow.  Ask your health care provider or dietitian how much protein from animal sources you should eat each day, and also how much salt and calcium you should have each day. This information is not intended to replace advice given to you by your health care provider. Make sure you discuss any questions you have with your health care provider. Document Revised: 01/29/2019 Document Reviewed: 09/19/2016 Elsevier Patient Education  2020 [employer]  

## 2020-03-17 NOTE — Progress Notes (Signed)
   03/17/20  CC: FOLLOWUP URETEROSCOPY  HPI:Tracie Aguilar is a 34yo here for followup after ureteroscopic stone extraction  Blood pressure 107/71, pulse 83, temperature 97.9 F (36.6 C), height 5\' 3"  (1.6 m), weight 215 lb (97.5 kg), last menstrual period 03/09/2020. NED. A&Ox3.   No respiratory distress   Abd soft, NT, ND Normal external genitalia with patent urethral meatus  Cystoscopy Procedure Note  Patient identification was confirmed, informed consent was obtained, and patient was prepped using Betadine solution.  Lidocaine jelly was administered per urethral meatus.    Procedure: - Flexible cystoscope introduced, without any difficulty.   - Thorough search of the bladder revealed:    normal urethral meatus    normal urothelium    no stones    no ulcers     no tumors    no urethral polyps    no trabeculation  - Ureteral orifices were normal in position and appearance.  LEFT URETERAL STENT REMOVED INTACT  Post-Procedure: - Patient tolerated the procedure well  Assessment/ Plan: RTC 6 weeks with renal 03/11/2020   No follow-ups on file.  [place], MD

## 2020-03-17 NOTE — Progress Notes (Signed)
Urological Symptom Review  Patient is experiencing the following symptoms: Frequent urination Burning/pain with urination Get up at night to urinate Weak stream   Review of Systems  Gastrointestinal (upper)  : Nausea Vomiting  Gastrointestinal (lower) : Negative for lower GI symptoms  Constitutional : Negative for symptoms  Skin: Negative for skin symptoms  Eyes: Negative for eye symptoms  Ear/Nose/Throat : Negative for Ear/Nose/Throat symptoms  Hematologic/Lymphatic: Negative for Hematologic/Lymphatic symptoms  Cardiovascular : Negative for cardiovascular symptoms  Respiratory : Negative for respiratory symptoms  Endocrine: Negative for endocrine symptoms  Musculoskeletal: Negative for musculoskeletal symptoms  Neurological: Negative for neurological symptoms  Psychologic: Negative for psychiatric symptoms

## 2020-03-18 LAB — CALCULI, WITH PHOTOGRAPH (CLINICAL LAB)
Calcium Oxalate Dihydrate: 25 %
Calcium Oxalate Monohydrate: 60 %
Hydroxyapatite: 15 %
Weight Calculi: 13 mg

## 2020-04-01 ENCOUNTER — Ambulatory Visit: Payer: Medicaid Other | Admitting: Physician Assistant

## 2020-04-01 ENCOUNTER — Encounter: Payer: Self-pay | Admitting: Physician Assistant

## 2020-04-01 VITALS — BP 99/64 | HR 99 | Temp 97.7°F

## 2020-04-01 DIAGNOSIS — R519 Headache, unspecified: Secondary | ICD-10-CM

## 2020-04-01 MED ORDER — DICLOFENAC SODIUM 75 MG PO TBEC
75.0000 mg | DELAYED_RELEASE_TABLET | Freq: Two times a day (BID) | ORAL | 1 refills | Status: DC | PRN
Start: 2020-04-01 — End: 2020-04-29

## 2020-04-01 MED ORDER — PROMETHAZINE HCL 25 MG PO TABS: 25.0000 mg | ORAL_TABLET | Freq: Three times a day (TID) | ORAL | 0 refills | Status: DC | PRN

## 2020-04-01 MED ORDER — AMOXICILLIN 500 MG PO CAPS
500.0000 mg | ORAL_CAPSULE | Freq: Three times a day (TID) | ORAL | 0 refills | Status: DC
Start: 2020-04-01 — End: 2020-04-29

## 2020-04-01 NOTE — Progress Notes (Signed)
BP 99/64   Pulse 99   Temp 97.7 F (36.5 C)   LMP 03/09/2020 Comment: negative serum pregnancy 03/11/20  SpO2 99%    Subjective:    Patient ID: [name], female    DOB: 01-01-1986, 34 y.o.   MRN: [id number]  HPI: SHAMYRA FARIAS is a 34 y.o. female presenting on 04/01/2020 for No chief complaint on file.   HPI     Pt had a negative covid 19 screening questionnaire.   Pt has 1 more f/u with urology for kidney stones. .    Her genitals cyst resolved with treatment by gyn   She says she is now having frequent HA.  She says twice/day.   She says it will come on and then it lasts up to 4 hours at the most.   She is taking IBU and flexeril for it.    She does not awaken with the HA but says it starts after she has been up for a while.    No history of HA,  She points to L frontal area when asked where it hurts.  She says it is always in that same spot.  She says her eye waters when the head hurts.   She says the HA started first or second week in April.   She denies any new stressors.  No denies depression.  No recent changes in anxiety.    She went to ER for HA and had CT head on 02/23/20 which was showed a partially empty sella turcica but was otherwise unremarkable.  .   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  She says she has never been given a reason but was just told that it isn't correctable.   She is not working,  Last time she worked was July 2020.   She was laid off.    Interestingly, she says she doesn't think she had any HA when she was having the severe kidney stone pain       Relevant past medical, surgical, family and social history reviewed and updated as indicated. Interim medical history since our last visit reviewed. Allergies and medications reviewed and updated.    Current Outpatient Medications:  .  cyclobenzaprine (FLEXERIL) 10 MG tablet, Take 1 tablet (10 mg total) by mouth 2 (two) times daily as needed for muscle spasms.,  Disp: 20 tablet, Rfl: 0 .  ibuprofen (ADVIL) 200 MG tablet, Take 800 mg by mouth every 8 (eight) hours as needed (for pain.)., Disp: , Rfl:  .  albuterol (VENTOLIN HFA) 108 (90 Base) MCG/ACT inhaler, Inhale 2 puffs into the lungs every 6 (six) hours as needed for wheezing or shortness of breath. (Patient not taking: Reported on 04/01/2020), Disp: 18 g, Rfl: 1    Review of Systems  Per HPI unless specifically indicated above     Objective:    BP 99/64   Pulse 99   Temp 97.7 F (36.5 C)   LMP 03/09/2020 Comment: negative serum pregnancy 03/11/20  SpO2 99%   Wt Readings from Last 3 Encounters:  03/17/20 215 lb (97.5 kg)  03/10/20 210 lb (95.3 kg)  03/10/20 220 lb (99.8 kg)    Physical Exam Vitals reviewed.  Constitutional:      General: She is not in acute distress.    Appearance: She is well-developed. She is obese.     Comments: At beginning of appointment pt had no HA.  As the appt progressed, she  developed a HA and her L eye was noted to start watering and she appeared to be in a lot of discomfort.    HENT:     Head: Normocephalic and atraumatic.  Eyes:     Extraocular Movements: Extraocular movements intact.     Right eye: No nystagmus.     Left eye: No nystagmus.  Cardiovascular:     Rate and Rhythm: Normal rate and regular rhythm.  Pulmonary:     Effort: Pulmonary effort is normal.     Breath sounds: Normal breath sounds.  Abdominal:     General: Bowel sounds are normal.     Palpations: Abdomen is soft. There is no mass.     Tenderness: There is no abdominal tenderness.  Musculoskeletal:     Cervical back: Neck supple.     Right lower leg: No edema.     Left lower leg: No edema.  Lymphadenopathy:     Cervical: No cervical adenopathy.  Skin:    General: Skin is warm and dry.  Neurological:     Mental Status: She is alert and oriented to person, place, and time.     Cranial Nerves: Cranial nerves are intact. No facial asymmetry.     Sensory: Sensation is  intact.     Motor: No weakness or tremor.     Coordination: Coordination is intact.     Gait: Gait is intact. Gait normal.     Deep Tendon Reflexes:     Reflex Scores:      Patellar reflexes are 2+ on the right side and 2+ on the left side. Psychiatric:        Behavior: Behavior normal.           Assessment & Plan:     Encounter Diagnosis  Name Primary?  . Bad headache Yes      While her HA does not appear to be sinus related, will cover with antibiotics in light of significant nasal inflammation and congestion.   The abrupt onset of her HA as seen in the office would indicate possible new onset migraine.  Will refer to neurology for further evaluation.    She is given rx for diclofenac and phenergan as she says this helped been very helpful for her.   Pt to follow up in 1 month for recheck.

## 2020-04-19 ENCOUNTER — Ambulatory Visit: Payer: Medicaid Other | Admitting: Urology

## 2020-04-29 ENCOUNTER — Encounter: Payer: Self-pay | Admitting: Physician Assistant

## 2020-04-29 ENCOUNTER — Ambulatory Visit: Payer: Medicaid Other | Admitting: Physician Assistant

## 2020-04-29 DIAGNOSIS — Z87898 Personal history of other specified conditions: Secondary | ICD-10-CM

## 2020-04-29 NOTE — Progress Notes (Signed)
There were no vitals taken for this visit.   Subjective:    Patient ID: Tracie Aguilar, female    DOB: [birth year], 34 y.o.   MRN: [id number]  HPI: Tracie Aguilar is a 34 y.o. female presenting on 04/29/2020 for No chief complaint on file.   HPI  This is a telemedicine appointment due to coronavirus pandemic.  It started via [employer] but then due to technical difficulties pt was having with her video enabled device, it was changed to telephone for the reminder of the appointment.    I connected with  [name] on 04/29/20 by a video enabled telemedicine application and verified that I am speaking with the correct person using two identifiers.   I discussed the limitations of evaluation and management by telemedicine. The patient expressed understanding and agreed to proceed.  Pt is at home.  Provider is at office.      Pt is 34yoF who was seen 1 month ago for HA and referred to neurology for evaluation of same.  She was prescribed antibiotics.  Pt finished the antibiotics.   She has had only 2 HA and none this week.  This is a signifciant improvement for her and she is feeling much better.  Also, she has Stopped smoking    Relevant past medical, surgical, family and social history reviewed and updated as indicated. Interim medical history since our last visit reviewed. Allergies and medications reviewed and updated.    Current Outpatient Medications:  .  albuterol (VENTOLIN HFA) 108 (90 Base) MCG/ACT inhaler, Inhale 2 puffs into the lungs every 6 (six) hours as needed for wheezing or shortness of breath. (Patient not taking: Reported on 04/01/2020), Disp: 18 g, Rfl: 1 .  cyclobenzaprine (FLEXERIL) 10 MG tablet, Take 1 tablet (10 mg total) by mouth 2 (two) times daily as needed for muscle spasms. (Patient not taking: Reported on 04/29/2020), Disp: 20 tablet, Rfl: 0 .  diclofenac (VOLTAREN) 75 MG EC tablet, Take 1 tablet (75 mg total) by mouth 2 (two) times daily as  needed. (Patient not taking: Reported on 04/29/2020), Disp: 30 tablet, Rfl: 1 .  ibuprofen (ADVIL) 200 MG tablet, Take 800 mg by mouth every 8 (eight) hours as needed (for pain.). (Patient not taking: Reported on 04/29/2020), Disp: , Rfl:  .  promethazine (PHENERGAN) 25 MG tablet, Take 1 tablet (25 mg total) by mouth every 8 (eight) hours as needed for nausea or vomiting. (Patient not taking: Reported on 04/29/2020), Disp: 20 tablet, Rfl: 0     Review of Systems  Per HPI unless specifically indicated above     Objective:    There were no vitals taken for this visit.  Wt Readings from Last 3 Encounters:  03/17/20 215 lb (97.5 kg)  03/10/20 210 lb (95.3 kg)  03/10/20 220 lb (99.8 kg)    Physical Exam Constitutional:      General: She is not in acute distress.    Appearance: She is not toxic-appearing.  HENT:     Head: Normocephalic and atraumatic.  Pulmonary:     Effort: Pulmonary effort is normal. No respiratory distress.  Neurological:     Mental Status: She is alert and oriented to person, place, and time.  Psychiatric:        Attention and Perception: Attention normal.        Speech: Speech normal.        Behavior: Behavior is cooperative.  Assessment & Plan:    Encounter Diagnosis  Name Primary?  . H/O headache Yes      Discussed with pt and recommended she cancel neurology  appt due to HA resolved  Pt to follow up with urology per their recomendations  F/u 3 months for recheck.  She is to contact office sooner prn

## 2020-05-03 ENCOUNTER — Ambulatory Visit (HOSPITAL_COMMUNITY)
Admission: RE | Admit: 2020-05-03 | Discharge: 2020-05-03 | Disposition: A | Discharge status: Home / Self Care | Payer: Self-pay | Source: Ambulatory Visit | Attending: Urology | Admitting: Urology

## 2020-05-03 ENCOUNTER — Other Ambulatory Visit: Payer: Self-pay

## 2020-05-03 DIAGNOSIS — N132 Hydronephrosis with renal and ureteral calculous obstruction: Secondary | ICD-10-CM | POA: Insufficient documentation

## 2020-05-05 ENCOUNTER — Ambulatory Visit: Payer: Medicaid Other | Admitting: Urology

## 2020-05-12 ENCOUNTER — Ambulatory Visit: Payer: Medicaid Other | Admitting: Urology

## 2020-05-17 NOTE — Progress Notes (Signed)
Letter mailed

## 2020-06-08 ENCOUNTER — Ambulatory Visit (INDEPENDENT_AMBULATORY_CARE_PROVIDER_SITE_OTHER): Payer: Self-pay | Admitting: Neurology

## 2020-06-08 ENCOUNTER — Encounter: Payer: Self-pay | Admitting: Neurology

## 2020-06-08 ENCOUNTER — Telehealth: Payer: Self-pay | Admitting: Neurology

## 2020-06-08 VITALS — BP 113/73 | HR 63 | Ht 62.0 in | Wt 228.0 lb

## 2020-06-08 DIAGNOSIS — G44019 Episodic cluster headache, not intractable: Secondary | ICD-10-CM | POA: Insufficient documentation

## 2020-06-08 DIAGNOSIS — R51 Headache with orthostatic component, not elsewhere classified: Secondary | ICD-10-CM

## 2020-06-08 DIAGNOSIS — R519 Headache, unspecified: Secondary | ICD-10-CM

## 2020-06-08 DIAGNOSIS — H5462 Unqualified visual loss, left eye, normal vision right eye: Secondary | ICD-10-CM

## 2020-06-08 MED ORDER — ZEMBRACE SYMTOUCH 3 MG/0.5ML ~~LOC~~ SOAJ: 3.0000 mg | Freq: Once | SUBCUTANEOUS | 0 refills | Status: DC | PRN

## 2020-06-08 MED ORDER — TOSYMRA 10 MG/ACT NA SOLN: 1.0000 | NASAL | 0 refills | Status: DC

## 2020-06-08 NOTE — Telephone Encounter (Signed)
self pay order sent to [facility]. They will reach out to the patient to schedule.  °
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
## 2020-06-08 NOTE — Patient Instructions (Addendum)
At onset of headache choice one of the following (not both in the same day)  1. Inject Zembrace and then repeat in 15 minutes if needed 2. SPay tosymra into closest nostril every hour as needed maximum 3x in one day   Cluster Headache A cluster headache is a type of headache that causes deep, intense head pain. Cluster headaches can last from 15 minutes to 3 hours. They usually occur:  On one side of the head. They may occur on the other side when a new cluster of headaches begins.  Repeatedly over weeks to months.  Several times a day.  At the same time of day, often at night.  More often in the fall and springtime. What are the causes? The cause of this condition is not known. What increases the risk? This condition is more likely to develop in:  Males.  People who drink alcohol.  People who smoke or use products that contain nicotine or tobacco.  People who take medicines that cause blood vessels to expand, such as nitroglycerin.  People who take antihistamines. What are the signs or symptoms? Symptoms of this condition include:  Severe pain on one side of the head that begins behind or around your eye or temple.  Pain on one side of the head.  Nausea.  Sensitivity to light.  Runny nose and nasal stuffiness.  Sweaty, pale skin on the face.  Droopy or swollen eyelid, eye redness, or tearing.  Restlessness and agitation. How is this diagnosed? This condition may be diagnosed based on:  Your symptoms.  A physical exam. Your health care provider may order tests to see if your headaches are caused by another medical condition. These tests may show that you do not have cluster headaches. Tests may include:  A CT scan of your head.  An MRI of your head.  Lab tests. How is this treated? This condition may be treated with:  Medicines to relieve pain and to prevent repeated (recurrent) attacks. Some people may need a combination of medicines.  Oxygen. This  helps to relieve pain. Follow these instructions at home: Headache diary Keep a headache diary as told by your health care provider. Doing this can help you and your health care provider figure out what triggers your headaches. In your headache diary, include information about:  The time of day that your headache started and what you were doing when it began.  How long your headache lasted.  Where your pain started and whether it moved to other areas.  The type of pain, such as burning, stabbing, throbbing, or cramping.  Your level of pain. Use a pain scale and rate the pain with a number from 1 (mild) up to 10 (severe).  The treatment that you used, and any change in symptoms after treatment.  Medicines  Take over-the-counter and prescription medicines only as told by your health care provider.  Do not drive or use heavy machinery while taking prescription pain medicine.  Use oxygen as told by your health care provider. Lifestyle  Follow a regular sleep schedule. Do not vary the time that you go to bed or the amount that you sleep from day to day. It is important to stay on the same schedule during a cluster period to help prevent headaches.  Exercise regularly.  Eat a healthy diet and avoid foods that may trigger your headaches.  Avoid alcohol.  Do not use any products that contain nicotine or tobacco, such as cigarettes and e-cigarettes. If  you need help quitting, ask your health care provider. Contact a health care provider if:  Your headaches change, become more severe, or occur more often.  The medicine or oxygen that your health care provider recommended does not help. Get help right away if:  You faint.  You have weakness or numbness, especially on one side of your body or face.  You have double vision.  You have nausea or vomiting that does not go away within several hours.  You have trouble talking, walking, or keeping your balance.  You have pain or  stiffness in your neck.  You have a fever. Summary  A cluster headache is a type of headache that causes deep, intense head pain, usually on one side of the head.  Keep a headache diary to help discover what triggers your headaches.  A regular sleep schedule can help prevent headaches. This information is not intended to replace advice given to you by your health care provider. Make sure you discuss any questions you have with your health care provider. Document Revised: 03/19/2019 Document Reviewed: 06/20/2016 Elsevier Patient Education  2020 [employer].   Migraine Headache A migraine headache is an intense, throbbing pain on one side or both sides of the head. Migraine headaches may also cause other symptoms, such as nausea, vomiting, and sensitivity to light and noise. A migraine headache can last from 4 hours to 3 days. Talk with your doctor about what things may bring on (trigger) your migraine headaches. What are the causes? The exact cause of this condition is not known. However, a migraine may be caused when nerves in the brain become irritated and release chemicals that cause inflammation of blood vessels. This inflammation causes pain. This condition may be triggered or caused by:  Drinking alcohol.  Smoking.  Taking medicines, such as: ? Medicine used to treat chest pain (nitroglycerin). ? Birth control pills. ? Estrogen. ? Certain blood pressure medicines.  Eating or drinking products that contain nitrates, glutamate, aspartame, or tyramine. Aged cheeses, chocolate, or caffeine may also be triggers.  Doing physical activity. Other things that may trigger a migraine headache include:  Menstruation.  Pregnancy.  Hunger.  Stress.  Lack of sleep or too much sleep.  Weather changes.  Fatigue. What increases the risk? The following factors may make you more likely to experience migraine headaches:  Being a certain age. This condition is more common in  people who are 62-23 years old.  Being female.  Having a family history of migraine headaches.  Being Caucasian.  Having a mental health condition, such as depression or anxiety.  Being obese. What are the signs or symptoms? The main symptom of this condition is pulsating or throbbing pain. This pain may:  Happen in any area of the head, such as on one side or both sides.  Interfere with daily activities.  Get worse with physical activity.  Get worse with exposure to bright lights or loud noises. Other symptoms may include:  Nausea.  Vomiting.  Dizziness.  General sensitivity to bright lights, loud noises, or smells. Before you get a migraine headache, you may get warning signs (an aura). An aura may include:  Seeing flashing lights or having blind spots.  Seeing bright spots, halos, or zigzag lines.  Having tunnel vision or blurred vision.  Having numbness or a tingling feeling.  Having trouble talking.  Having muscle weakness. Some people have symptoms after a migraine headache (postdromal phase), such as:  Feeling tired.  Difficulty concentrating.  How is this diagnosed? A migraine headache can be diagnosed based on:  Your symptoms.  A physical exam.  Tests, such as: ? CT scan or an MRI of the head. These imaging tests can help rule out other causes of headaches. ? Taking fluid from the spine (lumbar puncture) and analyzing it (cerebrospinal fluid analysis, or CSF analysis). How is this treated? This condition may be treated with medicines that:  Relieve pain.  Relieve nausea.  Prevent migraine headaches. Treatment for this condition may also include:  Acupuncture.  Lifestyle changes like avoiding foods that trigger migraine headaches.  Biofeedback.  Cognitive behavioral therapy. Follow these instructions at home: Medicines  Take over-the-counter and prescription medicines only as told by your health care provider.  Ask your health  care provider if the medicine prescribed to you: ? Requires you to avoid driving or using heavy machinery. ? Can cause constipation. You may need to take these actions to prevent or treat constipation:  Drink enough fluid to keep your urine pale yellow.  Take over-the-counter or prescription medicines.  Eat foods that are high in fiber, such as beans, whole grains, and fresh fruits and vegetables.  Limit foods that are high in fat and processed sugars, such as fried or sweet foods. Lifestyle  Do not drink alcohol.  Do not use any products that contain nicotine or tobacco, such as cigarettes, e-cigarettes, and chewing tobacco. If you need help quitting, ask your health care provider.  Get at least 8 hours of sleep every night.  Find ways to manage stress, such as meditation, deep breathing, or yoga. General instructions      Keep a journal to find out what may trigger your migraine headaches. For example, write down: ? What you eat and drink. ? How much sleep you get. ? Any change to your diet or medicines.  If you have a migraine headache: ? Avoid things that make your symptoms worse, such as bright lights. ? It may help to lie down in a dark, quiet room. ? Do not drive or use heavy machinery. ? Ask your health care provider what activities are safe for you while you are experiencing symptoms.  Keep all follow-up visits as told by your health care provider. This is important. Contact a health care provider if:  You develop symptoms that are different or more severe than your usual migraine headache symptoms.  You have more than 15 headache days in one month. Get help right away if:  Your migraine headache becomes severe.  Your migraine headache lasts longer than 72 hours.  You have a fever.  You have a stiff neck.  You have vision loss.  Your muscles feel weak or like you cannot control them.  You start to lose your balance often.  You have trouble  walking.  You faint.  You have a seizure. Summary  A migraine headache is an intense, throbbing pain on one side or both sides of the head. Migraines may also cause other symptoms, such as nausea, vomiting, and sensitivity to light and noise.  This condition may be treated with medicines and lifestyle changes. You may also need to avoid certain things that trigger a migraine headache.  Keep a journal to find out what may trigger your migraine headaches.  Contact your health care provider if you have more than 15 headache days in a month or you develop symptoms that are different or more severe than your usual migraine headache symptoms. This information is not intended  to replace advice given to you by your health care provider. Make sure you discuss any questions you have with your health care provider. Document Revised: 01/31/2019 Document Reviewed: 11/21/2018 Elsevier Patient Education  2020 Else[employer] Inc[employer]    Sumatriptan injection What is this medicine? SUMATRIPTAN (soo ma TRIP tan) is used to treat migraines with or without aura. An aura is a strange feeling or visual disturbance that warns you of an attack. It is not used to prevent migraines. This medicine may be used for other purposes; ask your health care provider or pharmacist if you have questions. COMMON BRAND NAME(S): Alsuma, Imitrex, Imitrex STAT dose, Sumavel DosePro System, ZEMBRACE What should I tell my health care provider before I take this medicine? They need to know if you have any of these conditions:  cigarette smoker  circulation problems in fingers and toes  diabetes  heart disease  high blood pressure  high cholesterol  history of irregular heartbeat  history of stroke  kidney disease  liver disease  stomach or intestine problems  an unusual or allergic reaction to sumatriptan, latex, other medicines, foods, dyes, or preservatives  pregnant or trying to get pregnant  breast-feeding How  should I use this medicine? This medicine is for injection under the skin. You will be taught how to prepare and give this medicine. Use exactly as directed. Do not take your medicine more often than directed. Talk to your pediatrician regarding the use of this medicine in children. Special care may be needed. Overdosage: If you think you have taken too much of this medicine contact a poison control center or emergency room at once. NOTE: This medicine is only for you. Do not share this medicine with others. What if I miss a dose? This does not apply. This medicine is not for regular use. What may interact with this medicine? Do not take this medicine with any of the following medicines:  certain medicines for migraine headache like almotriptan, eletriptan, frovatriptan, naratriptan, rizatriptan, sumatriptan, zolmitriptan  ergot alkaloids like dihydroergotamine, ergonovine, ergotamine, methylergonovine  MAOIs like Carbex, Eldepryl, Marplan, Nardil, and Parnate This medicine may also interact with the following medications:  certain medicines for depression, anxiety, or psychotic disorders This list may not describe all possible interactions. Give your health care provider a list of all the medicines, herbs, non-prescription drugs, or dietary supplements you use. Also tell them if you smoke, drink alcohol, or use illegal drugs. Some items may interact with your medicine. What should I watch for while using this medicine? Visit your healthcare professional for regular checks on your progress. Tell your healthcare professional if your symptoms do not start to get better or if they get worse. You may get drowsy or dizzy. Do not drive, use machinery, or do anything that needs mental alertness until you know how this medicine affects you. Do not stand up or sit up quickly, especially if you are an older patient. This reduces the risk of dizzy or fainting spells. Alcohol may interfere with the effect  of this medicine. Tell your healthcare professional right away if you have any change in your eyesight. If you take migraine medicines for 10 or more days a month, your migraines may get worse. Keep a diary of headache days and medicine use. Contact your healthcare professional if your migraine attacks occur more frequently. What side effects may I notice from receiving this medicine? Side effects that you should report to your doctor or health care professional as soon as possible:  allergic reactions like skin rash, itching or hives, swelling of the face, lips, or tongue  changes in vision  chest pain or chest tightness  signs and symptoms of a dangerous change in heartbeat or heart rhythm like chest pain; dizziness; fast, irregular heartbeat; palpitations; feeling faint or lightheaded; falls; breathing problems  signs and symptoms of a stroke like changes in vision; confusion; trouble speaking or understanding; severe headaches; sudden numbness or weakness of the face, arm or leg; trouble walking; dizziness; loss of balance or coordination  signs and symptoms of serotonin syndrome like irritable; confusion; diarrhea; fast or irregular heartbeat; muscle twitching; stiff muscles; trouble walking; sweating; high fever; seizures; chills; vomiting Side effects that usually do not require medical attention (report to your doctor or health care professional if they continue or are bothersome):  diarrhea  dizziness  drowsiness  dry mouth  headache  nausea, vomiting  pain, redness, or irritation at site where injected  pain, tingling, numbness in the hands or feet  stomach pain This list may not describe all possible side effects. Call your doctor for medical advice about side effects. You may report side effects to FDA at 1-800-FDA-1088. Where should I keep my medicine? Keep out of the reach of children. You will be instructed on how to store this medicine. Throw away any unused  medicine after the expiration date on the label. NOTE: This sheet is a summary. It may not cover all possible information. If you have questions about this medicine, talk to your doctor, pharmacist, or health care provider.  2020 Elsevier/Gold Standard (2018-02-22 12:58:21) Sumatriptan injection What is this medicine? SUMATRIPTAN (soo ma TRIP tan) is used to treat migraines with or without aura. An aura is a strange feeling or visual disturbance that warns you of an attack. It is not used to prevent migraines. This medicine may be used for other purposes; ask your health care provider or pharmacist if you have questions. COMMON BRAND NAME(S): Alsuma, Imitrex, Imitrex STAT dose, Sumavel DosePro System, ZEMBRACE What should I tell my health care provider before I take this medicine? They need to know if you have any of these conditions:  cigarette smoker  circulation problems in fingers and toes  diabetes  heart disease  high blood pressure  high cholesterol  history of irregular heartbeat  history of stroke  kidney disease  liver disease  stomach or intestine problems  an unusual or allergic reaction to sumatriptan, latex, other medicines, foods, dyes, or preservatives  pregnant or trying to get pregnant  breast-feeding How should I use this medicine? This medicine is for injection under the skin. You will be taught how to prepare and give this medicine. Use exactly as directed. Do not take your medicine more often than directed. Talk to your pediatrician regarding the use of this medicine in children. Special care may be needed. Overdosage: If you think you have taken too much of this medicine contact a poison control center or emergency room at once. NOTE: This medicine is only for you. Do not share this medicine with others. What if I miss a dose? This does not apply. This medicine is not for regular use. What may interact with this medicine? Do not take this medicine  with any of the following medicines:  certain medicines for migraine headache like almotriptan, eletriptan, frovatriptan, naratriptan, rizatriptan, sumatriptan, zolmitriptan  ergot alkaloids like dihydroergotamine, ergonovine, ergotamine, methylergonovine  MAOIs like Carbex, Eldepryl, Marplan, Nardil, and Parnate This medicine may also interact with the following  medications:  certain medicines for depression, anxiety, or psychotic disorders This list may not describe all possible interactions. Give your health care provider a list of all the medicines, herbs, non-prescription drugs, or dietary supplements you use. Also tell them if you smoke, drink alcohol, or use illegal drugs. Some items may interact with your medicine. What should I watch for while using this medicine? Visit your healthcare professional for regular checks on your progress. Tell your healthcare professional if your symptoms do not start to get better or if they get worse. You may get drowsy or dizzy. Do not drive, use machinery, or do anything that needs mental alertness until you know how this medicine affects you. Do not stand up or sit up quickly, especially if you are an older patient. This reduces the risk of dizzy or fainting spells. Alcohol may interfere with the effect of this medicine. Tell your healthcare professional right away if you have any change in your eyesight. If you take migraine medicines for 10 or more days a month, your migraines may get worse. Keep a diary of headache days and medicine use. Contact your healthcare professional if your migraine attacks occur more frequently. What side effects may I notice from receiving this medicine? Side effects that you should report to your doctor or health care professional as soon as possible:  allergic reactions like skin rash, itching or hives, swelling of the face, lips, or tongue  changes in vision  chest pain or chest tightness  signs and symptoms of a  dangerous change in heartbeat or heart rhythm like chest pain; dizziness; fast, irregular heartbeat; palpitations; feeling faint or lightheaded; falls; breathing problems  signs and symptoms of a stroke like changes in vision; confusion; trouble speaking or understanding; severe headaches; sudden numbness or weakness of the face, arm or leg; trouble walking; dizziness; loss of balance or coordination  signs and symptoms of serotonin syndrome like irritable; confusion; diarrhea; fast or irregular heartbeat; muscle twitching; stiff muscles; trouble walking; sweating; high fever; seizures; chills; vomiting Side effects that usually do not require medical attention (report to your doctor or health care professional if they continue or are bothersome):  diarrhea  dizziness  drowsiness  dry mouth  headache  nausea, vomiting  pain, redness, or irritation at site where injected  pain, tingling, numbness in the hands or feet  stomach pain This list may not describe all possible side effects. Call your doctor for medical advice about side effects. You may report side effects to FDA at 1-800-FDA-1088. Where should I keep my medicine? Keep out of the reach of children. You will be instructed on how to store this medicine. Throw away any unused medicine after the expiration date on the label. NOTE: This sheet is a summary. It may not cover all possible information. If you have questions about this medicine, talk to your doctor, pharmacist, or health care provider.  2020 Elsevier/Gold Standard (2018-02-22 12:58:21)

## 2020-06-18 ENCOUNTER — Other Ambulatory Visit: Payer: Medicaid Other

## 2020-06-19 ENCOUNTER — Ambulatory Visit
Admission: RE | Admit: 2020-06-19 | Discharge: 2020-06-19 | Disposition: A | Discharge status: Home / Self Care | Payer: Medicaid Other | Source: Ambulatory Visit | Attending: Neurology | Admitting: Neurology

## 2020-06-19 ENCOUNTER — Other Ambulatory Visit: Payer: Self-pay

## 2020-06-19 DIAGNOSIS — G44019 Episodic cluster headache, not intractable: Secondary | ICD-10-CM

## 2020-06-19 DIAGNOSIS — R51 Headache with orthostatic component, not elsewhere classified: Secondary | ICD-10-CM

## 2020-06-19 DIAGNOSIS — H5462 Unqualified visual loss, left eye, normal vision right eye: Secondary | ICD-10-CM

## 2020-06-19 DIAGNOSIS — R519 Headache, unspecified: Secondary | ICD-10-CM

## 2020-06-19 MED ORDER — GADOBENATE DIMEGLUMINE 529 MG/ML IV SOLN
20.0000 mL | Freq: Once | INTRAVENOUS | Status: AC | PRN
  Administered 2020-06-19: 20 mL via INTRAVENOUS

## 2020-07-27 ENCOUNTER — Ambulatory Visit: Payer: Medicaid Other | Admitting: Physician Assistant

## 2020-07-27 ENCOUNTER — Encounter: Payer: Self-pay | Admitting: Physician Assistant

## 2020-07-27 ENCOUNTER — Other Ambulatory Visit: Payer: Self-pay

## 2020-07-27 VITALS — BP 108/68 | HR 74 | Temp 98.9°F | Wt 224.8 lb

## 2020-07-27 DIAGNOSIS — F419 Anxiety disorder, unspecified: Secondary | ICD-10-CM

## 2020-07-27 NOTE — Progress Notes (Signed)
BP 108/68   Pulse 74   Temp 98.9 F (37.2 C)   Wt 224 lb 12.8 oz (102 kg)   SpO2 99%   BMI 41.12 kg/m    Subjective:    Patient ID: Tracie Aguilar, female    DOB: 04/04/1986, 34 y.o.   MRN: [id number]  HPI: Tracie Aguilar is a 34 y.o. female presenting on 07/27/2020 for No chief complaint on file.   HPI   Pt had a negative covid 19 screening questionnaire.   Pt is 34yoF with routine follow up  Pt was referred to neurologist who gave her meds for HA but she hasn't taken any of it because she has had no HA.  She says she hasn't had any since the antibiotics she was given here.  Discussed that sinusitis may have been causing the HA.   She is going to [facility].  She is doing well.  She is not working.  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    She Haven't got covid vaccination    Relevant past medical, surgical, family and social history reviewed and updated as indicated. Interim medical history since our last visit reviewed. Allergies and medications reviewed and updated.    Current Outpatient Medications:  .  FLUoxetine (PROZAC) 20 MG tablet, Take 20 mg by mouth daily., Disp: , Rfl:  .  hydrOXYzine (ATARAX/VISTARIL) 50 MG tablet, Take 50 mg by mouth 3 (three) times daily as needed., Disp: , Rfl:  .  cyclobenzaprine (FLEXERIL) 10 MG tablet, Take 10 mg by mouth as needed (headache). (Patient not taking: Reported on 07/27/2020), Disp: , Rfl:  .  IBUPROFEN PO, Take by mouth as needed. (Patient not taking: Reported on 07/27/2020), Disp: , Rfl:  .  SUMAtriptan (TOSYMRA) 10 MG/ACT SOLN, Place 1 spray into the nose every hour. Maximum 3 sprays in one day. (Patient not taking: Reported on 07/27/2020), Disp: 2 each, Rfl: 0 .  SUMAtriptan Succinate (ZEMBRACE SYMTOUCH) 3 MG/0.5ML SOAJ, Inject 3 mg into the skin once as needed for up to 1 dose. May repeat in 15 minutes. If symptoms persist, repeat in 2 hours. Max 4 injections daily. (Patient not  taking: Reported on 07/27/2020), Disp: 6 mL, Rfl: 0     Review of Systems  Per HPI unless specifically indicated above     Objective:    BP 108/68   Pulse 74   Temp 98.9 F (37.2 C)   Wt 224 lb 12.8 oz (102 kg)   SpO2 99%   BMI 41.12 kg/m   Wt Readings from Last 3 Encounters:  07/27/20 224 lb 12.8 oz (102 kg)  06/08/20 228 lb (103.4 kg)  03/17/20 215 lb (97.5 kg)    Physical Exam Vitals reviewed.  Constitutional:      General: She is not in acute distress.    Appearance: She is well-developed. She is obese. She is not ill-appearing.  HENT:     Head: Normocephalic and atraumatic.  Cardiovascular:     Rate and Rhythm: Normal rate and regular rhythm.  Pulmonary:     Effort: Pulmonary effort is normal.     Breath sounds: Normal breath sounds.  Abdominal:     General: Bowel sounds are normal.     Palpations: Abdomen is soft. There is no mass.     Tenderness: There is no abdominal tenderness.  Musculoskeletal:     Cervical back: Neck supple.     Right lower leg: No edema.  Left lower leg: No edema.  Lymphadenopathy:     Cervical: No cervical adenopathy.  Skin:    General: Skin is warm and dry.  Neurological:     Mental Status: She is alert and oriented to person, place, and time.     Gait: Gait normal.  Psychiatric:        Attention and Perception: Attention normal.        Speech: Speech normal.        Behavior: Behavior normal. Behavior is cooperative.           Assessment & Plan:    Encounter Diagnoses  Name Primary?  [name] Anxiety Yes  . Morbid obesity (HCC)      -pt to continue with [facility] -she is counseled and encouraged to get covid vaccination -she is to follow up 1 year.  She is to contact office sooner prn

## 2020-09-07 ENCOUNTER — Ambulatory Visit: Payer: Medicaid Other | Admitting: Family Medicine

## 2020-09-08 ENCOUNTER — Ambulatory Visit: Payer: Medicaid Other | Admitting: Physician Assistant

## 2020-09-08 ENCOUNTER — Encounter: Payer: Self-pay | Admitting: Physician Assistant

## 2020-09-08 VITALS — BP 100/80 | HR 88 | Temp 98.2°F | Ht 62.0 in | Wt 228.0 lb

## 2020-09-08 DIAGNOSIS — R350 Frequency of micturition: Secondary | ICD-10-CM

## 2020-09-08 DIAGNOSIS — R109 Unspecified abdominal pain: Secondary | ICD-10-CM

## 2020-09-08 DIAGNOSIS — N898 Other specified noninflammatory disorders of vagina: Secondary | ICD-10-CM

## 2020-09-08 LAB — POCT URINALYSIS DIPSTICK
Bilirubin, UA: NEGATIVE
Blood, UA: NEGATIVE
Glucose, UA: NEGATIVE
Ketones, UA: NEGATIVE
Nitrite, UA: NEGATIVE
Protein, UA: NEGATIVE
Spec Grav, UA: >=1.03 — AB (ref 1.010–1.025)
Urobilinogen, UA: 0.2 E.U./dL
pH, UA: 6 (ref 5.0–8.0)

## 2020-09-08 MED ORDER — CEPHALEXIN 500 MG PO CAPS
500.0000 mg | ORAL_CAPSULE | Freq: Four times a day (QID) | ORAL | 0 refills | Status: AC
Start: 2020-09-08 — End: 2020-09-15

## 2020-09-08 NOTE — Progress Notes (Signed)
BP 100/80   Pulse 88   Temp 98.2 F (36.8 C)   Ht 5\' 2"  (1.575 m)   Wt 228 lb (103.4 kg)   SpO2 96%   BMI 41.70 kg/m    Subjective:    Patient ID: , female    DOB: Jul 04, 1986, 34 y.o.   MRN: [id number]  HPI: Tracie Aguilar is a 34 y.o. female presenting on 09/08/2020 for Urinary Frequency (for about 3 days. pt reports abd pain and back pain. with abd pain mostly resolved.) and Vaginal Discharge (pt noticed vaginal discharge beginning of Nov. after her period. pt states discharge was yellow at first and now it's a light yellow color. pt reports vaginal itching.)   HPI     Pt had a negative covid 19 screening qustionnaire.    Chief Complaint  Patient presents with  . Urinary Frequency    for about 3 days. pt reports abd pain and back pain. with abd pain mostly resolved.  . Vaginal Discharge    pt noticed vaginal discharge beginning of Nov. after her period. pt states discharge was yellow at first and now it's a light yellow color. pt reports vaginal itching.       Pain at first felt like another kidney stone but then pain went away rather suddenly.  She is sill going frequently.         Relevant past medical, surgical, family and social history reviewed and updated as indicated. Interim medical history since our last visit reviewed. Allergies and medications reviewed and updated.   Current Outpatient Medications:  .  FLUoxetine (PROZAC) 20 MG tablet, Take 20 mg by mouth daily., Disp: , Rfl:  .  hydrOXYzine (ATARAX/VISTARIL) 50 MG tablet, Take 50 mg by mouth 3 (three) times daily as needed., Disp: , Rfl:  .  IBUPROFEN PO, Take by mouth as needed. , Disp: , Rfl:  .  cyclobenzaprine (FLEXERIL) 10 MG tablet, Take 10 mg by mouth as needed (headache). (Patient not taking: Reported on 07/27/2020), Disp: , Rfl:  .  SUMAtriptan (TOSYMRA) 10 MG/ACT SOLN, Place 1 spray into the nose every hour. Maximum 3 sprays in one day. (Patient not taking: Reported  on 07/27/2020), Disp: 2 each, Rfl: 0 .  SUMAtriptan Succinate (ZEMBRACE SYMTOUCH) 3 MG/0.5ML SOAJ, Inject 3 mg into the skin once as needed for up to 1 dose. May repeat in 15 minutes. If symptoms persist, repeat in 2 hours. Max 4 injections daily. (Patient not taking: Reported on 07/27/2020), Disp: 6 mL, Rfl: 0    Review of Systems  Per HPI unless specifically indicated above     Objective:    BP 100/80   Pulse 88   Temp 98.2 F (36.8 C)   Ht 5\' 2"  (1.575 m)   Wt 228 lb (103.4 kg)   SpO2 96%   BMI 41.70 kg/m   Wt Readings from Last 3 Encounters:  09/08/20 228 lb (103.4 kg)  07/27/20 224 lb 12.8 oz (102 kg)  06/08/20 228 lb (103.4 kg)    Physical Exam Vitals reviewed.  Constitutional:      General: She is not in acute distress.    Appearance: She is well-developed. She is obese. She is not ill-appearing.  HENT:     Head: Normocephalic and atraumatic.  Cardiovascular:     Rate and Rhythm: Normal rate and regular rhythm.  Pulmonary:     Effort: Pulmonary effort is normal.     Breath sounds: Normal breath sounds.  Abdominal:     General: Bowel sounds are normal.     Palpations: Abdomen is soft. There is no mass.     Tenderness: There is no abdominal tenderness. There is no right CVA tenderness or left CVA tenderness.  Musculoskeletal:     Cervical back: Neck supple.  Lymphadenopathy:     Cervical: No cervical adenopathy.  Skin:    General: Skin is warm and dry.  Neurological:     Mental Status: She is alert and oriented to person, place, and time.  Psychiatric:        Behavior: Behavior normal.     Results for orders placed or performed in visit on 09/08/20  POCT Urinalysis Dipstick  Result Value Ref Range   Color, UA YELLOW    Clarity, UA CLOUDY    Glucose, UA Negative Negative   Bilirubin, UA NEG    Ketones, UA NEG    Spec Grav, UA >=1.030 (A) 1.010 - 1.025   Blood, UA NEG    pH, UA 6.0 5.0 - 8.0   Protein, UA Negative Negative   Urobilinogen, UA 0.2 0.2  or 1.0 E.U./dL   Nitrite, UA NEG    Leukocytes, UA Trace (A) Negative   Appearance     Odor        Assessment & Plan:    Encounter Diagnoses  Name Primary?  . Urinary frequency Yes  . Flank pain   . Vaginal discharge       rx keflex for urine.  Discussed that she may have passed another stone.  Encouraged pt to drink plenty of water and limit sodas, caffeine.    Discussed with pt that she needs STD testing and that service is not something provided by [facility].  Discussed taht she has [place] medicaid which may pay for that.  Recommended she contact her gyn for testing  Pt is again encouraged to get covid vaccination

## 2020-10-13 ENCOUNTER — Other Ambulatory Visit: Payer: Self-pay

## 2020-10-13 ENCOUNTER — Ambulatory Visit
Admission: EM | Admit: 2020-10-13 | Discharge: 2020-10-13 | Disposition: A | Discharge status: Home / Self Care | Payer: Medicaid Other | Attending: Emergency Medicine | Admitting: Emergency Medicine

## 2020-10-13 DIAGNOSIS — Z202 Contact with and (suspected) exposure to infections with a predominantly sexual mode of transmission: Secondary | ICD-10-CM | POA: Insufficient documentation

## 2020-10-13 DIAGNOSIS — N898 Other specified noninflammatory disorders of vagina: Secondary | ICD-10-CM | POA: Insufficient documentation

## 2020-10-13 DIAGNOSIS — Z3202 Encounter for pregnancy test, result negative: Secondary | ICD-10-CM | POA: Insufficient documentation

## 2020-10-13 DIAGNOSIS — R300 Dysuria: Secondary | ICD-10-CM | POA: Insufficient documentation

## 2020-10-13 LAB — POCT URINALYSIS DIP (MANUAL ENTRY)
Bilirubin, UA: NEGATIVE
Blood, UA: NEGATIVE
Glucose, UA: NEGATIVE mg/dL
Ketones, POC UA: NEGATIVE mg/dL
Nitrite, UA: NEGATIVE
Protein Ur, POC: NEGATIVE mg/dL
Spec Grav, UA: 1.025
Urobilinogen, UA: 0.2 U/dL
pH, UA: 6.5

## 2020-10-13 LAB — POCT URINE PREGNANCY: Preg Test, Ur: NEGATIVE

## 2020-10-13 MED ORDER — DOXYCYCLINE HYCLATE 100 MG PO CAPS
100.0000 mg | ORAL_CAPSULE | Freq: Two times a day (BID) | ORAL | 0 refills | Status: AC
Start: 2020-10-13 — End: 2020-10-20
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
## 2020-10-13 NOTE — Discharge Instructions (Addendum)
Vaginal self-swab obtained.  We will follow up with you regarding abnormal results Prescribed doxycycline 100 mg twice a day for 7 days Take medications as prescribed and to completion If tests results are positive, please abstain from sexual activity until you and your partner(s) have been treated Follow up with PCP or [facility] if symptoms persists Return here or go to ER if you have any new or worsening symptoms fever, chills, nausea, vomiting, abdominal or pelvic pain, painful intercourse, vaginal discharge, vaginal bleeding, persistent symptoms despite treatment, etc... 

## 2020-10-13 NOTE — ED Triage Notes (Signed)
Pt presents with c/o vaginal discharge and irritation for past 2 months, also recently began having burning with urination

## 2020-10-14 ENCOUNTER — Telehealth (HOSPITAL_COMMUNITY): Payer: Self-pay | Admitting: Emergency Medicine

## 2020-10-14 LAB — CERVICOVAGINAL ANCILLARY ONLY
Bacterial Vaginitis (gardnerella): POSITIVE — AB
Candida Glabrata: NEGATIVE
Candida Vaginitis: NEGATIVE
Chlamydia: NEGATIVE
Comment: NEGATIVE
Comment: NEGATIVE
Comment: NEGATIVE
Comment: NEGATIVE
Comment: NEGATIVE
Comment: NORMAL
Neisseria Gonorrhea: NEGATIVE
Trichomonas: POSITIVE — AB

## 2020-10-14 MED ORDER — METRONIDAZOLE 500 MG PO TABS
500.0000 mg | ORAL_TABLET | Freq: Two times a day (BID) | ORAL | 0 refills | Status: DC
Start: 2020-10-14 — End: 2021-04-14

## 2020-10-16 LAB — URINE CULTURE: Culture: 30000 — AB

## 2020-10-18 ENCOUNTER — Telehealth (HOSPITAL_COMMUNITY): Payer: Self-pay | Admitting: Emergency Medicine

## 2020-10-18 MED ORDER — CEPHALEXIN 500 MG PO CAPS
500.0000 mg | ORAL_CAPSULE | Freq: Two times a day (BID) | ORAL | 0 refills | Status: AC
Start: 2020-10-18 — End: 2020-10-23

## 2021-04-09 ENCOUNTER — Emergency Department (HOSPITAL_COMMUNITY)
Admission: EM | Admit: 2021-04-09 | Discharge: 2021-04-11 | Disposition: A | Discharge status: Other Acute Inpatient Hospital | Payer: No Typology Code available for payment source | Attending: Emergency Medicine | Admitting: Emergency Medicine

## 2021-04-09 ENCOUNTER — Encounter (HOSPITAL_COMMUNITY): Payer: Self-pay | Admitting: Emergency Medicine

## 2021-04-09 ENCOUNTER — Other Ambulatory Visit: Payer: Self-pay

## 2021-04-09 DIAGNOSIS — Z046 Encounter for general psychiatric examination, requested by authority: Secondary | ICD-10-CM | POA: Insufficient documentation

## 2021-04-09 DIAGNOSIS — Z20822 Contact with and (suspected) exposure to covid-19: Secondary | ICD-10-CM | POA: Insufficient documentation

## 2021-04-09 DIAGNOSIS — Z87891 Personal history of nicotine dependence: Secondary | ICD-10-CM | POA: Insufficient documentation

## 2021-04-09 DIAGNOSIS — J45909 Unspecified asthma, uncomplicated: Secondary | ICD-10-CM | POA: Insufficient documentation

## 2021-04-09 DIAGNOSIS — F332 Major depressive disorder, recurrent severe without psychotic features: Secondary | ICD-10-CM | POA: Insufficient documentation

## 2021-04-09 DIAGNOSIS — R45851 Suicidal ideations: Secondary | ICD-10-CM | POA: Insufficient documentation
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
## 2021-04-10 DIAGNOSIS — F332 Major depressive disorder, recurrent severe without psychotic features: Secondary | ICD-10-CM | POA: Diagnosis not present

## 2021-04-10 LAB — CBC WITH DIFFERENTIAL/PLATELET
Abs Immature Granulocytes: 0.08 10*3/uL — ABNORMAL HIGH (ref 0.00–0.07)
Basophils Absolute: 0.1 10*3/uL (ref 0.0–0.1)
Basophils Relative: 1 %
Eosinophils Absolute: 0.1 10*3/uL (ref 0.0–0.5)
Eosinophils Relative: 1 %
HCT: 40.3 % (ref 36.0–46.0)
Hemoglobin: 13.3 g/dL (ref 12.0–15.0)
Immature Granulocytes: 1 %
Lymphocytes Relative: 20 %
Lymphs Abs: 2.6 10*3/uL (ref 0.7–4.0)
MCH: 28.5 pg (ref 26.0–34.0)
MCHC: 33 g/dL (ref 30.0–36.0)
MCV: 86.5 fL (ref 80.0–100.0)
Monocytes Absolute: 0.7 10*3/uL (ref 0.1–1.0)
Monocytes Relative: 6 %
Neutro Abs: 9.2 10*3/uL — ABNORMAL HIGH (ref 1.7–7.7)
Neutrophils Relative %: 71 %
Platelets: 367 10*3/uL (ref 150–400)
RBC: 4.66 MIL/uL (ref 3.87–5.11)
RDW: 13.6 % (ref 11.5–15.5)
WBC: 12.7 10*3/uL — ABNORMAL HIGH (ref 4.0–10.5)
nRBC: 0 % (ref 0.0–0.2)

## 2021-04-10 LAB — URINALYSIS, ROUTINE W REFLEX MICROSCOPIC
Bilirubin Urine: NEGATIVE
Glucose, UA: NEGATIVE mg/dL
Hgb urine dipstick: NEGATIVE
Ketones, ur: NEGATIVE mg/dL
Leukocytes,Ua: NEGATIVE
Nitrite: NEGATIVE
Protein, ur: NEGATIVE mg/dL
Specific Gravity, Urine: 1.009 (ref 1.005–1.030)
pH: 6 (ref 5.0–8.0)

## 2021-04-10 LAB — ACETAMINOPHEN LEVEL: Acetaminophen (Tylenol), Serum: <10 ug/mL — ABNORMAL LOW (ref 10–30)

## 2021-04-10 LAB — COMPREHENSIVE METABOLIC PANEL
ALT: 16 U/L (ref 0–44)
AST: 14 U/L — ABNORMAL LOW (ref 15–41)
Albumin: 3.8 g/dL (ref 3.5–5.0)
Alkaline Phosphatase: 100 U/L (ref 38–126)
Anion gap: 7 (ref 5–15)
BUN: 8 mg/dL (ref 6–20)
CO2: 24 mmol/L (ref 22–32)
Calcium: 8.8 mg/dL — ABNORMAL LOW (ref 8.9–10.3)
Chloride: 104 mmol/L (ref 98–111)
Creatinine, Ser: 0.55 mg/dL (ref 0.44–1.00)
GFR, Estimated: >60 mL/min (ref >60)
Glucose, Bld: 98 mg/dL (ref 70–99)
Potassium: 3.5 mmol/L (ref 3.5–5.1)
Sodium: 135 mmol/L (ref 135–145)
Total Bilirubin: 0.2 mg/dL — ABNORMAL LOW (ref 0.3–1.2)
Total Protein: 7.4 g/dL (ref 6.5–8.1)

## 2021-04-10 LAB — RAPID URINE DRUG SCREEN, HOSP PERFORMED
Amphetamines: NOT DETECTED
Barbiturates: NOT DETECTED
Benzodiazepines: NOT DETECTED
Cocaine: NOT DETECTED
Opiates: NOT DETECTED
Tetrahydrocannabinol: POSITIVE — AB

## 2021-04-10 LAB — PREGNANCY, URINE: Preg Test, Ur: NEGATIVE

## 2021-04-10 LAB — SALICYLATE LEVEL: Salicylate Lvl: <7 mg/dL — ABNORMAL LOW (ref 7.0–30.0)

## 2021-04-10 LAB — ETHANOL: Alcohol, Ethyl (B): <10 mg/dL (ref <10)

## 2021-04-10 MED ORDER — ZIPRASIDONE MESYLATE 20 MG IM SOLR
20.0000 mg | Freq: Once | INTRAMUSCULAR | Status: AC
  Administered 2021-04-10: 20 mg via INTRAMUSCULAR
  Filled 2021-04-10: qty 20

## 2021-04-10 MED ORDER — LORAZEPAM 2 MG/ML IJ SOLN: 1.0000 mg | Freq: Once | INTRAMUSCULAR | Status: DC

## 2021-04-10 MED ORDER — STERILE WATER FOR INJECTION IJ SOLN
INTRAMUSCULAR | Status: AC
  Administered 2021-04-10: 1 mL
  Filled 2021-04-10: qty 10

## 2021-04-10 MED ORDER — FLUOXETINE HCL 20 MG PO CAPS: 20.0000 mg | ORAL_CAPSULE | Freq: Every day | ORAL | Status: DC

## 2021-04-10 MED ORDER — HYDROXYZINE HCL 25 MG PO TABS
25.0000 mg | ORAL_TABLET | Freq: Three times a day (TID) | ORAL | Status: DC | PRN
  Administered 2021-04-10: 25 mg via ORAL
  Filled 2021-04-10: qty 1

## 2021-04-10 NOTE — ED Notes (Signed)
Pt is being TTS  
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
## 2021-04-10 NOTE — ED Notes (Signed)
Pt changed in to burgandy scrubs. Wanded by security. Personal belonging secured in locker with lock.
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
## 2021-04-10 NOTE — ED Notes (Signed)
Pt requesting to see her IVC paperwork, RN explained to pt that per policy pt is not allowed to review it while under IVC. Consulting civil engineer notified.

## 2021-04-10 NOTE — ED Notes (Signed)
Pt approached Nurse Station, agitated from being in [facility] and demands she have her consult with [facility] now. Pt demanding she be placed in a room.

## 2021-04-11 ENCOUNTER — Encounter (HOSPITAL_COMMUNITY): Payer: Self-pay | Admitting: Nurse Practitioner

## 2021-04-11 ENCOUNTER — Inpatient Hospital Stay (HOSPITAL_COMMUNITY)
Admission: AD | Admit: 2021-04-11 | Discharge: 2021-04-14 | DRG: 885 | Disposition: A | Discharge status: Home / Self Care | Payer: No Typology Code available for payment source | Attending: Psychiatry | Admitting: Psychiatry

## 2021-04-11 ENCOUNTER — Other Ambulatory Visit: Payer: Self-pay

## 2021-04-11 DIAGNOSIS — Z882 Allergy status to sulfonamides status: Secondary | ICD-10-CM

## 2021-04-11 DIAGNOSIS — J45909 Unspecified asthma, uncomplicated: Secondary | ICD-10-CM | POA: Diagnosis present

## 2021-04-11 DIAGNOSIS — Z79899 Other long term (current) drug therapy: Secondary | ICD-10-CM | POA: Diagnosis not present

## 2021-04-11 DIAGNOSIS — Z62810 Personal history of physical and sexual abuse in childhood: Secondary | ICD-10-CM | POA: Diagnosis present

## 2021-04-11 DIAGNOSIS — F332 Major depressive disorder, recurrent severe without psychotic features: Principal | ICD-10-CM | POA: Diagnosis present

## 2021-04-11 DIAGNOSIS — F4310 Post-traumatic stress disorder, unspecified: Secondary | ICD-10-CM | POA: Diagnosis present

## 2021-04-11 DIAGNOSIS — Z86718 Personal history of other venous thrombosis and embolism: Secondary | ICD-10-CM

## 2021-04-11 DIAGNOSIS — R45851 Suicidal ideations: Secondary | ICD-10-CM | POA: Diagnosis present

## 2021-04-11 DIAGNOSIS — Z87891 Personal history of nicotine dependence: Secondary | ICD-10-CM

## 2021-04-11 DIAGNOSIS — F419 Anxiety disorder, unspecified: Secondary | ICD-10-CM | POA: Diagnosis present

## 2021-04-11 DIAGNOSIS — G4700 Insomnia, unspecified: Secondary | ICD-10-CM | POA: Diagnosis present

## 2021-04-11 LAB — RESP PANEL BY RT-PCR (FLU A&B, COVID) ARPGX2
Influenza A by PCR: NEGATIVE
Influenza B by PCR: NEGATIVE
SARS Coronavirus 2 by RT PCR: NEGATIVE

## 2021-04-11 MED ORDER — FLUOXETINE HCL 20 MG PO CAPS
20.0000 mg | ORAL_CAPSULE | Freq: Every day | ORAL | Status: DC
  Administered 2021-04-11: 20 mg via ORAL
  Filled 2021-04-11 (×4): qty 1

## 2021-04-11 MED ORDER — ALUM & MAG HYDROXIDE-SIMETH 200-200-20 MG/5ML PO SUSP: 30.0000 mL | ORAL | Status: DC | PRN

## 2021-04-11 MED ORDER — ACETAMINOPHEN 325 MG PO TABS: 650.0000 mg | ORAL_TABLET | Freq: Four times a day (QID) | ORAL | Status: DC | PRN

## 2021-04-11 MED ORDER — NICOTINE 14 MG/24HR TD PT24
14.0000 mg | MEDICATED_PATCH | Freq: Every day | TRANSDERMAL | Status: DC
  Filled 2021-04-11 (×4): qty 1

## 2021-04-11 MED ORDER — ALBUTEROL SULFATE HFA 108 (90 BASE) MCG/ACT IN AERS: 2.0000 | INHALATION_SPRAY | Freq: Four times a day (QID) | RESPIRATORY_TRACT | Status: DC | PRN

## 2021-04-11 MED ORDER — TRAZODONE HCL 50 MG PO TABS
50.0000 mg | ORAL_TABLET | Freq: Every evening | ORAL | Status: DC | PRN
  Administered 2021-04-11 – 2021-04-12 (×3): 50 mg via ORAL
  Filled 2021-04-11 (×3): qty 1
  Filled 2021-04-11: qty 7

## 2021-04-11 MED ORDER — ARIPIPRAZOLE 5 MG PO TABS
5.0000 mg | ORAL_TABLET | Freq: Every day | ORAL | Status: DC
  Administered 2021-04-11: 5 mg via ORAL
  Filled 2021-04-11 (×5): qty 1

## 2021-04-11 MED ORDER — HYDROXYZINE HCL 50 MG PO TABS
50.0000 mg | ORAL_TABLET | Freq: Three times a day (TID) | ORAL | Status: DC | PRN
  Administered 2021-04-11 – 2021-04-13 (×3): 50 mg via ORAL
  Filled 2021-04-11 (×2): qty 1
  Filled 2021-04-11: qty 10
  Filled 2021-04-11: qty 1

## 2021-04-11 MED ORDER — MAGNESIUM HYDROXIDE 400 MG/5ML PO SUSP: 30.0000 mL | Freq: Every day | ORAL | Status: DC | PRN
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
## 2021-04-11 NOTE — [facility] Group Notes (Signed)
Occupational Therapy Group Note Date: 04/11/2021 Group Topic/Focus: Self-Esteem  Group Description: Group encouraged increased engagement and participation through discussion and activity focused on self-esteem. Patients explored and discussed the differences between healthy and low self-esteem and how it affects our daily lives and occupations with a focus on relationships, work, school, self-care, and personal leisure interests. Group discussion then transitioned into identifying specific strategies to boost self-esteem and engaged in a collaborative and independent activity looking at positive thoughts and affirmations.   Therapeutic Goal(s): Understand and recognize the differences between healthy and low self-esteem Identify healthy strategies to improve/build self-esteem Participation Level: Patient did not attend OT group session despite personal invitation.    Plan: Continue to engage patient in OT groups 2 - 3x/week.  04/11/2021  Giah Fickett, MOT, OTR/L 
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
## 2021-04-11 NOTE — H&P (Signed)
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 identify and develop effective coping behaviors will improve, Compliance with prescribed medications will improve, and Ability to identify triggers associated with substance abuse/mental health issues will improve  Physician Treatment Plan for Secondary Diagnosis: Principal Problem:   MDD (major depressive disorder), recurrent severe, without psychosis ([place]) Active Problems:   PTSD (post-traumatic stress disorder)  Long Term Goal(s): Improvement in symptoms so as ready for discharge  Short Term Goals: Ability to identify changes in lifestyle to reduce recurrence of condition will improve, Ability to verbalize feelings will  improve, Ability to disclose and discuss suicidal ideas, Ability to demonstrate self-control will improve, Ability to identify and develop effective coping behaviors will improve, Compliance with prescribed medications will improve, and Ability to identify triggers associated with substance abuse/mental health issues will improve  I certify that inpatient services furnished can reasonably be expected to improve the patient's condition.    Arthor Captain, MD 6/20/20221:03 PM

## 2021-04-11 NOTE — Progress Notes (Signed)
Recreation Therapy Notes  Date:  6.20.22 Time: 0930 Location: [address] Dayroom   Group Topic: Stress Management   Goal Area(s) Addresses: Patient will identify positive stress management techniques. Patient will identify benefits of using stress management post d/c.   Behavioral Response: Attentive   Intervention: Stress Management   Activity :  Meditation.  LRT played a meditation that focused on taking note of any tension that may be in the body or any sensations that may be felt such as cool, warmth, tingles or any slight sensations on the body.   Education:  Stress Management, Discharge Planning.   Education Outcome: Acknowledges Education   Clinical Observations/Feedback: Pt attended and participated.  Pt had no questions and expressed no concerns.       Caroll Rancher, LRT/CTRS         Caroll Rancher A 04/11/2021 12:42 PM

## 2021-04-11 NOTE — Tx Team (Signed)
Interdisciplinary Treatment and Diagnostic Plan Update  04/11/2021 Time of Session: 10:25 am AMAYRA KIEDROWSKI MRN: [id number]  Principal Diagnosis: MDD (major depressive disorder), recurrent severe, without psychosis ([place])  Secondary Diagnoses: Principal Problem:   MDD (major depressive disorder), recurrent severe, without psychosis ([place]) Active Problems:   PTSD (post-traumatic stress disorder)   Current Medications:  Current Facility-Administered Medications  Medication Dose Route Frequency Provider Last Rate Last Admin   acetaminophen (TYLENOL) tablet 650 mg  650 mg Oral Q6H PRN Bobbitt, Shalon E, NP       albuterol (VENTOLIN HFA) 108 (90 Base) MCG/ACT inhaler 2 puff  2 puff Inhalation Q6H PRN Arthor Captain, MD       alum & mag hydroxide-simeth (MAALOX/MYLANTA) 200-200-20 MG/5ML suspension 30 mL  30 mL Oral Q4H PRN Bobbitt, Shalon E, NP       ARIPiprazole (ABILIFY) tablet 5 mg  5 mg Oral Daily Arthor Captain, MD   5 mg at 04/11/21 1136   hydrOXYzine (ATARAX/VISTARIL) tablet 50 mg  50 mg Oral TID PRN Arthor Captain, MD   50 mg at 04/11/21 1305   magnesium hydroxide (MILK OF MAGNESIA) suspension 30 mL  30 mL Oral Daily PRN Bobbitt, Shalon E, NP       nicotine (NICODERM CQ - dosed in mg/24 hours) patch 14 mg  14 mg Transdermal Daily Bobbitt, Shalon E, NP       traZODone (DESYREL) tablet 50 mg  50 mg Oral QHS PRN Bobbitt, Shalon E, NP   50 mg at 04/11/21 0246   PTA Medications: Medications Prior to Admission  Medication Sig Dispense Refill Last Dose   FLUoxetine (PROZAC) 20 MG tablet Take 20 mg by mouth daily. (Patient not taking: Reported on 04/10/2021)      metroNIDAZOLE (FLAGYL) 500 MG tablet Take 1 tablet (500 mg total) by mouth 2 (two) times daily. (Patient not taking: Reported on 04/10/2021) 14 tablet 0    SUMAtriptan (TOSYMRA) 10 MG/ACT SOLN Place 1 spray into the nose every hour. Maximum 3 sprays in one day. (Patient not taking: No sig reported) 2 each 0    SUMAtriptan  Succinate (ZEMBRACE SYMTOUCH) 3 MG/0.5ML SOAJ Inject 3 mg into the skin once as needed for up to 1 dose. May repeat in 15 minutes. If symptoms persist, repeat in 2 hours. Max 4 injections daily. (Patient not taking: No sig reported) 6 mL 0     Patient Stressors: Loss of brother (09/2020) Marital or family conflict Medication change or noncompliance  Patient Strengths: Capable of independent living [employer]ator fund of knowledge Physical Health  Treatment Modalities: Medication Management, Group therapy, Case management,  1 to 1 session with clinician, Psychoeducation, Recreational therapy.   Physician Treatment Plan for Primary Diagnosis: MDD (major depressive disorder), recurrent severe, without psychosis ([place]) Long Term Goal(s): Improvement in symptoms so as ready for discharge   Short Term Goals: Ability to identify changes in lifestyle to reduce recurrence of condition will improve Ability to verbalize feelings will improve Ability to disclose and discuss suicidal ideas Ability to demonstrate self-control will improve Ability to identify and develop effective coping behaviors will improve Compliance with prescribed medications will improve Ability to identify triggers associated with substance abuse/mental health issues will improve  Medication Management: Evaluate patient's response, side effects, and tolerance of medication regimen.  Therapeutic Interventions: 1 to 1 sessions, Unit Group sessions and Medication administration.  Evaluation of Outcomes: Not Met  Physician Treatment Plan for Secondary Diagnosis: Principal Problem:  MDD (major depressive disorder), recurrent severe, without psychosis ([place]) Active Problems:   PTSD (post-traumatic stress disorder)  Long Term Goal(s): Improvement in symptoms so as ready for discharge   Short Term Goals: Ability to identify changes in lifestyle to reduce recurrence of condition will improve Ability to verbalize  feelings will improve Ability to disclose and discuss suicidal ideas Ability to demonstrate self-control will improve Ability to identify and develop effective coping behaviors will improve Compliance with prescribed medications will improve Ability to identify triggers associated with substance abuse/mental health issues will improve     Medication Management: Evaluate patient's response, side effects, and tolerance of medication regimen.  Therapeutic Interventions: 1 to 1 sessions, Unit Group sessions and Medication administration.  Evaluation of Outcomes: Not Met   RN Treatment Plan for Primary Diagnosis: MDD (major depressive disorder), recurrent severe, without psychosis ([place]) Long Term Goal(s): Knowledge of disease and therapeutic regimen to maintain health will improve  Short Term Goals: Ability to remain free from injury will improve, Ability to participate in decision making will improve, Ability to verbalize feelings will improve, Ability to identify and develop effective coping behaviors will improve, and Compliance with prescribed medications will improve  Medication Management: RN will administer medications as ordered by provider, will assess and evaluate patient's response and provide education to patient for prescribed medication. RN will report any adverse and/or side effects to prescribing provider.  Therapeutic Interventions: 1 on 1 counseling sessions, Psychoeducation, Medication administration, Evaluate responses to treatment, Monitor vital signs and CBGs as ordered, Perform/monitor CIWA, COWS, AIMS and Fall Risk screenings as ordered, Perform wound care treatments as ordered.  Evaluation of Outcomes: Not Met   LCSW Treatment Plan for Primary Diagnosis: MDD (major depressive disorder), recurrent severe, without psychosis ([place]) Long Term Goal(s): Safe transition to appropriate next level of care at discharge, Engage patient in therapeutic group addressing interpersonal  concerns.  Short Term Goals: Engage patient in aftercare planning with referrals and resources, Increase social support, Increase ability to appropriately verbalize feelings, Increase emotional regulation, and Increase skills for wellness and recovery  Therapeutic Interventions: Assess for all discharge needs, 1 to 1 time with Social worker, Explore available resources and support systems, Assess for adequacy in community support network, Educate family and significant other(s) on suicide prevention, Complete Psychosocial Assessment, Interpersonal group therapy.  Evaluation of Outcomes: Not Met   Progress in Treatment: Attending groups: Yes. Participating in groups: No. Taking medication as prescribed: Yes. Toleration medication: Yes. Family/Significant other contact made: No, will contact:  if consent is provided Patient understands diagnosis: Yes. Discussing patient identified problems/goals with staff: Yes. Medical problems stabilized or resolved: Yes. Denies suicidal/homicidal ideation: Yes. Issues/concerns per patient self-inventory: No.   New problem(s) identified: No, Describe:  none  New Short Term/Long Term Goal(s): medication stabilization, elimination of SI thoughts, development of comprehensive mental wellness plan.    Patient Goals:  Did not attend  Discharge Plan or Barriers: Patient recently admitted. CSW will continue to follow and assess for appropriate referrals and possible discharge planning.    Reason for Continuation of Hospitalization: Anxiety Depression Medication stabilization Suicidal ideation  Estimated Length of Stay: 3-5 days  Attendees: Patient: Did not attend 04/11/2021 1:52 PM  Physician:  04/11/2021 1:52 PM  Nursing:  04/11/2021 1:52 PM  RN Care Manager: 04/11/2021 1:52 PM  Social Worker: Darletta Moll, [place] 04/11/2021 1:52 PM  Recreational Therapist:  04/11/2021 1:52 PM  Other:  04/11/2021 1:52 PM  Other:  04/11/2021 1:52 PM  Other:  04/11/2021  1:52 PM    Scribe for Treatment Team: Vassie Moselle, LCSW 04/11/2021 1:52 PM

## 2021-04-11 NOTE — Progress Notes (Signed)
   04/11/21 0923  Vital Signs  Pulse Rate (!) 106  BP (!) 131/94  BP Location Right Arm  BP Method Automatic  Patient Position (if appropriate) Standing   D: Patient denies SI/HI/AVH. Pt.rated 10/10 and denied depression. Pt. Was tearful and impulsive in the morning. Pt. Wanted to leave. Pt. Stated "they told me that a doctor would see me and then I would get to go home." Pt. Had some tearful outburst today during this shift. Pt. Stated "this is making me more anxious, I have a life outside of this hospital. I have to go to school and I have a job and kids!" A:  Patient took scheduled medicine.  Pt. Took 50mg  of  vistaril for anxiety. Pt asked for another dose saying "this stuff doesn't even help, I have to take 100mg  at home." Support and encouragement provided Routine safety checks conducted every 15 minutes. Patient  Informed to notify staff with any concerns.   R:  Safety maintained.

## 2021-04-11 NOTE — [facility] Group Notes (Signed)
Type of Therapy and Topic:  Group Therapy - Healthy vs Unhealthy Coping Skills   Participation Level:  Active     Description of Group The focus of this group was to determine what unhealthy coping techniques typically are used by group members and what healthy coping techniques would be helpful in coping with various problems. Patients were guided in becoming aware of the differences between healthy and unhealthy coping techniques. Patients were asked to identify 2-3 healthy coping skills they would like to learn to use more effectively.   Therapeutic Goals Patients learned that coping is what human beings do all day long to deal with various situations in their lives Patients defined and discussed healthy vs unhealthy coping techniques Patients identified their preferred coping techniques and identified whether these were healthy or unhealthy Patients determined 2-3 healthy coping skills they would like to become more familiar with and use more often. Patients provided support and ideas to each other     Summary of Patient Progress:  During group, Tracie Aguilar expressed that riding his motorcycle is a coping skill for him. Patient proved open to input from peers and feedback from CSW. Patient demonstrated insight into the subject matter, was respectful of peers, and participated throughout the entire session.     Therapeutic Modalities Cognitive Behavioral Therapy Motivational Interviewing    Quayshaun Hubbert, LCSW, LCAS Clincal Social Worker  [facility]
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
## 2021-04-12 DIAGNOSIS — F332 Major depressive disorder, recurrent severe without psychotic features: Principal | ICD-10-CM

## 2021-04-12 LAB — LIPID PANEL
Cholesterol: 167 mg/dL (ref 0–200)
HDL: 39 mg/dL — ABNORMAL LOW (ref >40)
LDL Cholesterol: 111 mg/dL — ABNORMAL HIGH (ref 0–99)
Total CHOL/HDL Ratio: 4.3 RATIO
Triglycerides: 87 mg/dL (ref <150)
VLDL: 17 mg/dL (ref 0–40)

## 2021-04-12 LAB — T4, FREE: Free T4: 0.96 ng/dL (ref 0.61–1.12)

## 2021-04-12 LAB — TSH: TSH: 1.123 u[IU]/mL (ref 0.350–4.500)

## 2021-04-12 MED ORDER — ESCITALOPRAM OXALATE 5 MG PO TABS
5.0000 mg | ORAL_TABLET | Freq: Every day | ORAL | Status: DC
  Administered 2021-04-12 – 2021-04-13 (×2): 5 mg via ORAL
  Filled 2021-04-12 (×4): qty 1

## 2021-04-12 NOTE — [facility] Group Notes (Signed)
Patient did not participate in group.    ADULT GRIEF GROUP NOTE:   Spiritual care group on grief and loss facilitated by chaplain Katy Marylene Masek, [facility]   Group Goal:   Support / Education around grief and loss   Members engage in facilitated group support and psycho-social education.   Group Description:   Following introductions and group rules, group members engaged in facilitated group dialog and support around topic of loss, with particular support around experiences of loss in their lives. Group Identified types of loss (relationships / self / things) and identified patterns, circumstances, and changes that precipitate losses. Reflected on thoughts / feelings around loss, normalized grief responses, and recognized variety in grief experience. Group noted Worden's four tasks of grief in discussion.   Group drew on Adlerian / Rogerian, narrative, MI,    

## 2021-04-12 NOTE — Progress Notes (Signed)
[facility] MD Progress Note  04/12/2021 5:49 PM Tracie Aguilar  MRN:  [id number]  Reason for admission:  Tracie Aguilar is a 35-year-old female with a history of major depressive disorder, anxiety, PTSD, asthma and prior inpatient psychiatric hospitalizations who presented to [facility] on 04/09/2021 under IVC taken out by her mother for sending text messages to multiple family members communicating that she plan to kill herself and implying that she would overdose on medication or hang herself.    Objective: Medical record reviewed.  Patient's case discussed in detail with members of the treatment team.  I met with and evaluated the patient on the unit for follow-up today.  The patient appears improved with stable although still constricted affect, normal rate of speech and organized coherent thought processes.  She appears less anxious, less dysphoric and less depressed.  There is no pressured speech today.  Patient states that she was upset yesterday afternoon about taking Abilify because she does not feel she has the diagnosis for which it is indicated.  I discussed with her that Abilify and other newer antipsychotics are used for mood stabilization in people who have mood disorders and adjunctively together with antidepressants to help improve antidepressant response.  Patient states she does not want to take Abilify, quetiapine or other atypical antipsychotic medication or mood stabilizers.  We will discontinue Abilify.  We discussed different antidepressants to treat her mood and anxiety symptoms.  She reports that in the past Prozac made her jittery and made her talk too fast.  Today she states a preference to take Lexapro which she took in the past, felt was helpful and did not cause any side effects.  We will start 5 mg of Lexapro today and may increase to 10 mg tomorrow depending on tolerability.  She reports that her appetite and sleep are okay.  She denies suicidal ideation today.  She denies any  physical problems other than mild headache yesterday after taking Prozac and Abilify.  The patient slept 6.75 hours last night.  Lipid panel this morning showed HDL of 39, LDL of 111 and otherwise WNL.  TSH was 1.123.  Free T4 was 0.96.  Hemoglobin A1c was drawn this morning but result is not yet available.  Patient's vital signs this morning were stable and within normal limits.  The patient took as needed trazodone last night for sleep.  Principal Problem: MDD (major depressive disorder), recurrent severe, without psychosis (HCC) Diagnosis: Principal Problem:   MDD (major depressive disorder), recurrent severe, without psychosis (HCC) Active Problems:   PTSD (post-traumatic stress disorder)  Total Time spent with patient:  25 minutes  Past Psychiatric History: See admission H&P  Past Medical History:  Past Medical History:  Diagnosis Date   Anxiety    Asthma    childhood asthma   Depression    DVT (deep venous thrombosis) (HCC)    Dysrhythmia    Kidney stones 2007   passed x2   Post traumatic stress disorder    prior medications    Tachycardia     Past Surgical History:  Procedure Laterality Date   CYSTOSCOPY WITH RETROGRADE PYELOGRAM, URETEROSCOPY AND STENT PLACEMENT Left 03/12/2020   Procedure: CYSTOSCOPY WITH LEFT RETROGRADE PYELOGRAM, LEFT URETEROSCOPY AND LEFT URETERAL STENT PLACEMENT;  Surgeon: McKenzie, Patrick L, MD;  Location: [facility];  Service: Urology;  Laterality: Left;   EXCISION VAGINAL CYST Left 12/24/2019   Procedure: REMOVAL OF LEFT PERICLITORAL LESION;  Surgeon: Eure, Luther H, MD;  Location: [facility];    Service: Gynecology;  Laterality: Left;   HOLMIUM LASER APPLICATION Left 3/73/4287   Procedure: HOLMIUM LASER LITHOTRIPSY;  Surgeon: Cleon Gustin, MD;  Location: [facility];  Service: Urology;  Laterality: Left;   TUBAL LIGATION  2013   Family History:  Family History  Problem Relation Age of Onset   COPD Mother    Alcohol abuse Mother    Arthritis Mother         OA   Hypertension Mother    Alcohol abuse Father    Hypertension Father    Diabetes Father    Lung cancer Maternal Grandmother    Stroke Maternal Grandfather    Hypertension Maternal Grandfather    Diabetes Maternal Grandfather    Stroke Paternal Grandfather    Breast cancer Cousin    Diabetes Cousin    Migraines Neg Hx    Headache Neg Hx    Family Psychiatric  History: See admission H&P Social History:  Social History   Substance and Sexual Activity  Alcohol Use Yes   Comment: once every couple of months, amount: "a couple of beers"     Social History   Substance and Sexual Activity  Drug Use Yes   Types: Marijuana   Comment: pt uses THC "sometimes," last use was a week ago    Social History   Socioeconomic History   Marital status: Single    Spouse name: Not on file   Number of children: 3   Years of education: currently in college    Highest education level: Some college, no degree  Occupational History   Not on file  Tobacco Use   Smoking status: Former    Packs/day: 1.50    Years: 17.00    Pack years: 25.50    Types: Cigarettes    Start date: 10/23/2001    Quit date: 03/01/2020    Years since quitting: 1.1   Smokeless tobacco: Never   Tobacco comments:    Stopped smoking a year ago  Vaping Use   Vaping Use: Some days  Substance and Sexual Activity   Alcohol use: Yes    Comment: once every couple of months, amount: "a couple of beers"   Drug use: Yes    Types: Marijuana    Comment: pt uses THC "sometimes," last use was a week ago   Sexual activity: Not Currently    Birth control/protection: Surgical  Other Topics Concern   Not on file  Social History Narrative   Single, employed. 3 children. 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   Smoke alarm and home, no firearms in the home.   Social Determinants of Health   Financial Resource Strain: Not on file  Food Insecurity: Not on file  Transportation Needs: Not on file   Physical Activity: Not on file  Stress: Not on file  Social Connections: Not on file   Additional Social History:                         Sleep: Fair  Appetite:  Fair  Current Medications: Current Facility-Administered Medications  Medication Dose Route Frequency Provider Last Rate Last Admin   acetaminophen (TYLENOL) tablet 650 mg  650 mg Oral Q6H PRN Bobbitt, Shalon E, NP       albuterol (VENTOLIN HFA) 108 (90 Base) MCG/ACT inhaler 2 puff  2 puff Inhalation Q6H PRN Arthor Captain, MD       alum & mag hydroxide-simeth (MAALOX/MYLANTA) 200-200-20 MG/5ML suspension  30 mL  30 mL Oral Q4H PRN Bobbitt, Shalon E, NP       escitalopram (LEXAPRO) tablet 5 mg  5 mg Oral Daily ,  L, MD   5 mg at 04/12/21 1348   hydrOXYzine (ATARAX/VISTARIL) tablet 50 mg  50 mg Oral TID PRN ,  L, MD   50 mg at 04/11/21 1305   magnesium hydroxide (MILK OF MAGNESIA) suspension 30 mL  30 mL Oral Daily PRN Bobbitt, Shalon E, NP       traZODone (DESYREL) tablet 50 mg  50 mg Oral QHS PRN Bobbitt, Shalon E, NP   50 mg at 04/11/21 2138    Lab Results:  Results for orders placed or performed during the hospital encounter of 04/11/21 (from the past 48 hour(s))  T4, free     Status: None   Collection Time: 04/12/21  6:20 AM  Result Value Ref Range   Free T4 0.96 0.61 - 1.12 ng/dL    Comment: (NOTE) Biotin ingestion may interfere with free T4 tests. If the results are inconsistent with the TSH level, previous test results, or the clinical presentation, then consider biotin interference. If needed, order repeat testing after stopping biotin. Performed at [facility], [address] [place], [place] [address]   Lipid panel     Status: Abnormal   Collection Time: 04/12/21  6:20 AM  Result Value Ref Range   Cholesterol 167 0 - 200 mg/dL   Triglycerides 87 <150 mg/dL   HDL 39 (L) >40 mg/dL   Total CHOL/HDL Ratio 4.3 RATIO   VLDL 17 0 - 40 mg/dL   LDL Cholesterol 111 (H) 0  - 99 mg/dL    Comment:        Total Cholesterol/HDL:CHD Risk Coronary Heart Disease Risk Table                     Men   Women  1/2 Average Risk   3.4   3.3  Average Risk       5.0   4.4  2 X Average Risk   9.6   7.1  3 X Average Risk  23.4   11.0        Use the calculated Patient Ratio above and the CHD Risk Table to determine the patient's CHD Risk.        ATP III CLASSIFICATION (LDL):  <100     mg/dL   Optimal  100-129  mg/dL   Near or Above                    Optimal  130-159  mg/dL   Borderline  160-189  mg/dL   High  >190     mg/dL   Very High Performed at [facility], [address] [place], [place] [address]   TSH     Status: None   Collection Time: 04/12/21  6:20 AM  Result Value Ref Range   TSH 1.123 0.350 - 4.500 uIU/mL    Comment: Performed by a 3rd Generation assay with a functional sensitivity of <=0.01 uIU/mL. Performed at [facility], [address] , [place] [address]     Blood Alcohol level:  Lab Results  Component Value Date   ETH <10 04/09/2021   ETH <5 06/02/2017    Metabolic Disorder Labs: Lab Results  Component Value Date   HGBA1C 4.9 12/22/2019   MPG 93.93 12/22/2019   MPG 97 06/03/2017     No results found for: PROLACTIN Lab Results  Component Value Date   CHOL 167 04/12/2021   TRIG 87 04/12/2021   HDL 39 (L) 04/12/2021   CHOLHDL 4.3 04/12/2021   VLDL 17 04/12/2021   LDLCALC 111 (H) 04/12/2021   LDLCALC 97 12/22/2019    Physical Findings: AIMS: Facial and Oral Movements Muscles of Facial Expression: None, normal Lips and Perioral Area: None, normal Jaw: None, normal Tongue: None, normal,Extremity Movements Upper (arms, wrists, hands, fingers): None, normal Lower (legs, knees, ankles, toes): None, normal, Trunk Movements Neck, shoulders, hips: None, normal, Overall Severity Severity of abnormal movements (highest score from questions above): None, normal Incapacitation due to  abnormal movements: None, normal Patient's awareness of abnormal movements (rate only patient's report): No Awareness, Dental Status Current problems with teeth and/or dentures?: No Does patient usually wear dentures?: No  CIWA:  CIWA-Ar Total: 2 COWS:  COWS Total Score: 2  Musculoskeletal: Strength & Muscle Tone: within normal limits Gait & Station: normal Patient leans: N/A  Psychiatric Specialty Exam:  Presentation  General Appearance: Appropriate for Environment  Eye Contact:Fair  Speech:Clear and Coherent; Normal Rate  Speech Volume:Normal  Handedness: No data recorded  Mood and Affect  Mood:Anxious  Affect:Congruent   Thought Process  Thought Processes:Coherent; Goal Directed; Linear  Descriptions of Associations:Intact  Orientation:Full (Time, Place and Person)  Thought Content:Logical  History of Schizophrenia/Schizoaffective disorder:No  Duration of Psychotic Symptoms:N/A  Hallucinations:Hallucinations: None  Ideas of Reference:None  Suicidal Thoughts:Suicidal Thoughts: No SI Active Intent and/or Plan: With Plan (Had plan to overdose or hang self prior to admission)  Homicidal Thoughts:Homicidal Thoughts: No   Sensorium  Memory:Immediate Good; Recent Good; Remote Good  Judgment:Fair  Insight:Fair   Executive Functions  Concentration:Fair  Attention Span:Fair  [place]  Language:Good   Psychomotor Activity  Psychomotor Activity:Psychomotor Activity: Normal   Assets  Assets:Communication Skills; Desire for Improvement; Housing; Vocational/Educational; Social Support   Sleep  Sleep:Sleep: Good Number of Hours of Sleep: 6.75    Physical Exam: Physical Exam Vitals and nursing note reviewed.  Constitutional:      General: She is not in acute distress.    Appearance: Normal appearance.  HENT:     Head: Normocephalic and atraumatic.  Pulmonary:     Effort: Pulmonary effort is normal.   Neurological:     General: No focal deficit present.     Mental Status: She is alert and oriented to person, place, and time.   Review of Systems  Constitutional:  Negative for chills, diaphoresis and fever.  HENT:  Negative for sore throat.   Respiratory:  Negative for cough and shortness of breath.   Cardiovascular:  Negative for chest pain and palpitations.  Gastrointestinal:  Negative for constipation, diarrhea, nausea and vomiting.  Genitourinary: Negative.   Musculoskeletal: Negative.   Neurological:  Positive for headaches. Negative for dizziness and tremors.  Psychiatric/Behavioral:  Positive for depression. Negative for hallucinations and suicidal ideas. The patient is nervous/anxious.   All other systems reviewed and are negative. Blood pressure 100/81, pulse 98, temperature 97.7 F (36.5 C), temperature source Oral, resp. rate 16, height 5' 3" (1.6 m), weight 101.6 kg, last menstrual period 04/07/2021, SpO2 99 %. Body mass index is 39.68 kg/m.   Treatment Plan Summary: Daily contact with patient to assess and evaluate symptoms and progress in treatment and Medication management  Continue IVC status  Continue every 15-minute safety checks  Encouraged participation in group therapy and therapeutic milieu  Depression/anxiety -Discontinue Prozac -  Start Lexapro 5 mg daily and consider increase to 10 mg daily on 04/13/2021 if patient able to tolerate without significant side effects -Discontinue Abilify as patient does not feel comfortable taking it -Continue hydroxyzine 50 mg TID PRN  Insomnia -Continue trazodone 50 mg at bedtime PRN  Asthma -Continue albuterol inhaler 2 puffs every 6 hours PRN  Discharge planning and progress.  Patient will need referral to outpatient therapist and psychiatrist at the time of discharge.    L , MD 04/12/2021, 5:49 PM  

## 2021-04-12 NOTE — Progress Notes (Signed)
Progress note    04/12/21 1000  Psych Admission Type (Psych Patients Only)  Admission Status Involuntary  Psychosocial Assessment  Patient Complaints Agitation;Anxiety;Crying spells;Depression  Eye Contact Avertive;Brief  Facial Expression Angry;Anxious;Sullen;Sad;Worried  Affect Angry;Anxious;Apathetic;Depressed;Sad;Sullen  Speech Logical/coherent;Loud  Interaction Assertive  Motor Activity Slow  Appearance/Hygiene In scrubs  Behavior Characteristics Cooperative;Anxious;Irritable  Mood Depressed;Anxious;Labile;Angry;Irritable;Sad;Sullen;Pleasant  Thought Administrator, sports thinking  Content Blaming others  Delusions Controlled;Persecutory  Perception WDL  Hallucination None reported or observed  Judgment Poor  Confusion None  Danger to Self  Current suicidal ideation? Denies  Danger to Others  Danger to Others None reported or observed

## 2021-04-12 NOTE — Progress Notes (Signed)
   04/11/21 2031  Vital Signs  Pulse Rate 74  Pulse Rate Source Monitor  Resp 16  BP 132/85  BP Location Right Arm  BP Method Automatic  Patient Position (if appropriate) Sitting  Patient compliant with medications, denies SI/HI/A/VH and verbally contracted for safety. C/O insomnia PRN trazodone 50 MG PO was administered at 2138 and effective by 2230.   Support and encouragement provided as needed.
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
## 2021-04-12 NOTE — Plan of Care (Signed)
  Problem: Coping: Goal: Ability to demonstrate self-control will improve Outcome: Progressing   Problem: Health Behavior/Discharge Planning: Goal: Identification of resources available to assist in meeting health care needs will improve Outcome: Progressing Goal: Compliance with treatment plan for underlying cause of condition will improve Outcome: Progressing   

## 2021-04-13 LAB — HEMOGLOBIN A1C
Hgb A1c MFr Bld: 5.2 % (ref 4.8–5.6)
Mean Plasma Glucose: 103 mg/dL

## 2021-04-13 MED ORDER — MELATONIN 3 MG PO TABS: 3.0000 mg | ORAL_TABLET | Freq: Every evening | ORAL | Status: DC | PRN

## 2021-04-13 MED ORDER — ESCITALOPRAM OXALATE 5 MG PO TABS
5.0000 mg | ORAL_TABLET | Freq: Once | ORAL | Status: AC
  Administered 2021-04-13: 5 mg via ORAL
  Filled 2021-04-13 (×2): qty 1

## 2021-04-13 MED ORDER — ESCITALOPRAM OXALATE 10 MG PO TABS
10.0000 mg | ORAL_TABLET | Freq: Every day | ORAL | Status: DC
  Administered 2021-04-14: 10 mg via ORAL
  Filled 2021-04-13 (×3): qty 1

## 2021-04-13 MED ORDER — GABAPENTIN 100 MG PO CAPS
100.0000 mg | ORAL_CAPSULE | Freq: Three times a day (TID) | ORAL | Status: DC
  Administered 2021-04-13 – 2021-04-14 (×3): 100 mg via ORAL
  Filled 2021-04-13 (×9): qty 1

## 2021-04-13 MED ORDER — GABAPENTIN 100 MG PO CAPS
100.0000 mg | ORAL_CAPSULE | Freq: Once | ORAL | Status: AC
  Administered 2021-04-13: 100 mg via ORAL
  Filled 2021-04-13 (×2): qty 1

## 2021-04-13 MED ORDER — MELATONIN 3 MG PO TABS
3.0000 mg | ORAL_TABLET | Freq: Every day | ORAL | Status: DC
  Administered 2021-04-13: 3 mg via ORAL
  Filled 2021-04-13 (×4): qty 1

## 2021-04-13 NOTE — [facility] Suicide Risk Assessment (Signed)
[facility] INPATIENT:  Family/Significant Other Suicide Prevention Education  Suicide Prevention Education:  Patient Refusal for Family/Significant Other Suicide Prevention Education: The patient Tracie Aguilar has refused to provide written consent for family/significant other to be provided Family/Significant Other Suicide Prevention Education during admission and/or prior to discharge.  Physician notified.  Felizardo Hoffmann 04/13/2021, 8:45 AM

## 2021-04-13 NOTE — Progress Notes (Signed)
Recreation Therapy Notes  Date: 6.22.22 Time: 0930 Location: [address] Dayroom   Group Topic: Stress Management   Goal Area(s) Addresses: Patient will actively participate in stress management techniques presented during session. Patient will successfully identify benefit of practicing stress management post d/c.   Behavioral Response: Appropriate   Intervention: Guided exercise with ambient sound and script   Activity :Guided Imagery   LRT provided education, instruction, and demonstration on practice of visualization via guided imagery. Patient was asked to participate in the technique introduced during session. Patients were given suggestions of ways to access scripts post d/c and encouraged to explore Youtube and other apps available on smartphones, tablets, and computers.   Education:  Stress Management, Discharge Planning.   Education Outcome: Acknowledges education   Clinical Observations/Feedback: Patient actively engaged in technique introduced, expressed no concerns.       Caroll Rancher, LRT/CTRS         Caroll Rancher A 04/13/2021 11:38 AM

## 2021-04-13 NOTE — [facility] Group Notes (Signed)
Type of Therapy and Topic:  Group Therapy:  Healthy and Unhealthy Supports   Participation Level:  Active    Description of Group:  Patients in this group were introduced to the idea of adding a variety of healthy supports to address the various needs in their lives. Patients discussed what additional healthy supports could be helpful in their recovery and wellness after discharge in order to prevent future hospitalizations.   An emphasis was placed on using counselor, doctor, therapy groups, 12-step groups, and problem-specific support groups to expand supports.  They also worked as a group on developing a specific plan for several patients to deal with unhealthy supports through boundary-setting, psychoeducation with loved ones, and even termination of relationships.   Therapeutic Goals:               1)  discuss importance of adding supports to stay well once out of the hospital             2)  compare healthy versus unhealthy supports and identify some examples of each             3)  generate ideas and descriptions of healthy supports that can be added             4)  offer mutual support about how to address unhealthy supports             5)  encourage active participation in and adherence to discharge plan               Summary of Patient Progress: Worksheets were provided and patient was given the opportunity to ask questions.    Therapeutic Modalities:   Motivational Interviewing Brief Solution-Focused Therapy 

## 2021-04-13 NOTE — Progress Notes (Signed)
[facility] MD Progress Note  04/13/2021 6:17 PM Tracie Aguilar  MRN:  [id number]  Reason for admission:  Tracie Aguilar is a 35 year old female with a history of major depressive disorder, anxiety, PTSD, asthma and prior inpatient psychiatric hospitalizations who presented to [facility] ED on 04/09/2021 under IVC taken out by her mother for sending text messages to multiple family members communicating that she plan to kill herself and implying that she would overdose on medication or hang herself.    Objective: Medical record reviewed.  Patient's case discussed in detail with members of the treatment team.  I met with and evaluated the patient on the unit for follow-up today.  The patient presents as improved from admission with more stable affect, normal rate and amount of speech and coherent thought processes.  She reports that she continues to experience anxiety and low mood at times but overall feels better than on admission.  She denies passive wish for death or suicidal ideation.  The patient states her low mood is due to her life stressors and not due to depression.  She feels she does not get support from her family members that she needs.  The patient becomes slightly tearful when discussing this lack of desired support from her family members but affect stabilizes quickly.  She reports feeling very close to her children and looks forward to seeing them and taking care of them after discharge.  She also looks forward to returning to school and work.  She continues to experience some early insomnia.  She is eating okay.  We discussed increasing her Lexapro to 10 mg to better address her mood and anxiety symptoms and the patient is receptive to this increase.  She also would like to take melatonin for sleep and I have ordered standing dose and PRN dose of melatonin for her.  Patient is receptive to starting low-dose gabapentin to help alleviate her anxiety.  We will start gabapentin 100 mg 3 times a day.   The patient slept 5.75 hours last night.  She reported anxiety last night that was not relieved with Vistaril.  Nursing notes document that she was more present in the milieu and participated in group last evening and chart notes indicate she attended groups today.  No new labs today.  Principal Problem: MDD (major depressive disorder), recurrent severe, without psychosis ([place]) Diagnosis: Principal Problem:   MDD (major depressive disorder), recurrent severe, without psychosis ([place]) Active Problems:   PTSD (post-traumatic stress disorder)  Total Time spent with patient:  20 minutes  Past Psychiatric History: See admission H&P  Past Medical History:  Past Medical History:  Diagnosis Date   Anxiety    Asthma    childhood asthma   Depression    DVT (deep venous thrombosis) ([place])    Dysrhythmia    Kidney stones 2007   passed x2   Post traumatic stress disorder    prior medications    Tachycardia     Past Surgical History:  Procedure Laterality Date   CYSTOSCOPY WITH RETROGRADE PYELOGRAM, URETEROSCOPY AND STENT PLACEMENT Left 03/12/2020   Procedure: CYSTOSCOPY WITH LEFT RETROGRADE PYELOGRAM, LEFT URETEROSCOPY AND LEFT URETERAL STENT PLACEMENT;  Surgeon: Cleon Gustin, MD;  Location: [facility];  Service: Urology;  Laterality: Left;   EXCISION VAGINAL CYST Left 12/24/2019   Procedure: REMOVAL OF LEFT PERICLITORAL LESION;  Surgeon: Florian Buff, MD;  Location: [facility];  Service: Gynecology;  Laterality: Left;   HOLMIUM LASER APPLICATION Left 7/90/3833   Procedure:  HOLMIUM LASER LITHOTRIPSY;  Surgeon: Cleon Gustin, MD;  Location: [facility];  Service: Urology;  Laterality: Left;   TUBAL LIGATION  2013   Family History:  Family History  Problem Relation Age of Onset   COPD Mother    Alcohol abuse Mother    Arthritis Mother        OA   Hypertension Mother    Alcohol abuse Father    Hypertension Father    Diabetes Father    Lung cancer Maternal Grandmother    Stroke Maternal  Grandfather    Hypertension Maternal Grandfather    Diabetes Maternal Grandfather    Stroke Paternal Grandfather    Breast cancer Cousin    Diabetes Cousin    Migraines Neg Hx    Headache Neg Hx    Family Psychiatric  History: See admission H&P Social History:  Social History   Substance and Sexual Activity  Alcohol Use Yes   Comment: once every couple of months, amount: "a couple of beers"     Social History   Substance and Sexual Activity  Drug Use Yes   Types: Marijuana   Comment: pt uses THC "sometimes," last use was a week ago    Social History   Socioeconomic History   Marital status: Single    Spouse name: Not on file   Number of children: 3   Years of education: currently in college    Highest education level: Some college, no degree  Occupational History   Not on file  Tobacco Use   Smoking status: Former    Packs/day: 1.50    Years: 17.00    Pack years: 25.50    Types: Cigarettes    Start date: 10/23/2001    Quit date: 03/01/2020    Years since quitting: 1.1   Smokeless tobacco: Never   Tobacco comments:    Stopped smoking a year ago  Vaping Use   Vaping Use: Some days  Substance and Sexual Activity   Alcohol use: Yes    Comment: once every couple of months, amount: "a couple of beers"   Drug use: Yes    Types: Marijuana    Comment: pt uses THC "sometimes," last use was a week ago   Sexual activity: Not Currently    Birth control/protection: Surgical  Other Topics Concern   Not on file  Social History Narrative   Single, employed. 3 children. 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   Smoke alarm and home, no firearms in the home.   Social Determinants of Health   Financial Resource Strain: Not on file  Food Insecurity: Not on file  Transportation Needs: Not on file  Physical Activity: Not on file  Stress: Not on file  Social Connections: Not on file   Additional Social History:                          Sleep: Fair  Appetite:  Fair  Current Medications: Current Facility-Administered Medications  Medication Dose Route Frequency Provider Last Rate Last Admin   acetaminophen (TYLENOL) tablet 650 mg  650 mg Oral Q6H PRN Bobbitt, Shalon E, NP       albuterol (VENTOLIN HFA) 108 (90 Base) MCG/ACT inhaler 2 puff  2 puff Inhalation Q6H PRN Arthor Captain, MD       alum & mag hydroxide-simeth (MAALOX/MYLANTA) 200-200-20 MG/5ML suspension 30 mL  30 mL Oral Q4H PRN Bobbitt, Lennie Muckle, NP       [  START ON 04/14/2021] escitalopram (LEXAPRO) tablet 10 mg  10 mg Oral Daily Arthor Captain, MD       gabapentin (NEURONTIN) capsule 100 mg  100 mg Oral TID Arthor Captain, MD       hydrOXYzine (ATARAX/VISTARIL) tablet 50 mg  50 mg Oral TID PRN Arthor Captain, MD   50 mg at 04/13/21 1455   magnesium hydroxide (MILK OF MAGNESIA) suspension 30 mL  30 mL Oral Daily PRN Bobbitt, Shalon E, NP       melatonin tablet 3 mg  3 mg Oral QHS Arthor Captain, MD       melatonin tablet 3 mg  3 mg Oral QHS PRN Arthor Captain, MD       traZODone (DESYREL) tablet 50 mg  50 mg Oral QHS PRN Bobbitt, Shalon E, NP   50 mg at 04/12/21 2057    Lab Results:  Results for orders placed or performed during the hospital encounter of 04/11/21 (from the past 48 hour(s))  T4, free     Status: None   Collection Time: 04/12/21  6:20 AM  Result Value Ref Range   Free T4 0.96 0.61 - 1.12 ng/dL    Comment: (NOTE) Biotin ingestion may interfere with free T4 tests. If the results are inconsistent with the TSH level, previous test results, or the clinical presentation, then consider biotin interference. If needed, order repeat testing after stopping biotin. Performed at [facility], [place] [address], [place], [place] [address]   Hemoglobin A1c     Status: None   Collection Time: 04/12/21  6:20 AM  Result Value Ref Range   Hgb A1c MFr Bld 5.2 4.8 - 5.6 %    Comment: (NOTE)         Prediabetes: 5.7 - 6.4         Diabetes: >6.4          Glycemic control for adults with diabetes: <7.0    Mean Plasma Glucose 103 mg/dL    Comment: (NOTE) Performed At: [facility] [place], [place] [id number] Rush Farmer MD VE:[id number]   Lipid panel     Status: Abnormal   Collection Time: 04/12/21  6:20 AM  Result Value Ref Range   Cholesterol 167 0 - 200 mg/dL   Triglycerides 87 <150 mg/dL   HDL 39 (L) >40 mg/dL   Total CHOL/HDL Ratio 4.3 RATIO   VLDL 17 0 - 40 mg/dL   LDL Cholesterol 111 (H) 0 - 99 mg/dL    Comment:        Total Cholesterol/HDL:CHD Risk Coronary Heart Disease Risk Table                     Men   Women  1/2 Average Risk   3.4   3.3  Average Risk       5.0   4.4  2 X Average Risk   9.6   7.1  3 X Average Risk  23.4   11.0        Use the calculated Patient Ratio above and the CHD Risk Table to determine the patient's CHD Risk.        ATP III CLASSIFICATION (LDL):  <100     mg/dL   Optimal  100-129  mg/dL   Near or Above                    Optimal  130-159  mg/dL  Borderline  160-189  mg/dL   High  >190     mg/dL   Very High Performed at [place] [address] [place], [place] [address]   TSH     Status: None   Collection Time: 04/12/21  6:20 AM  Result Value Ref Range   TSH 1.123 0.350 - 4.500 uIU/mL    Comment: Performed by a 3rd Generation assay with a functional sensitivity of <=0.01 uIU/mL. Performed at [facility], [place] [address] [place], [place] [address]     Blood Alcohol level:  Lab Results  Component Value Date   ETH <10 04/09/2021   ETH <5 86/57/8469    Metabolic Disorder Labs: Lab Results  Component Value Date   HGBA1C 5.2 04/12/2021   MPG 103 04/12/2021   MPG 93.93 12/22/2019   No results found for: PROLACTIN Lab Results  Component Value Date   CHOL 167 04/12/2021   TRIG 87 04/12/2021   HDL 39 (L) 04/12/2021   CHOLHDL 4.3 04/12/2021   VLDL 17 04/12/2021   LDLCALC 111 (H) 04/12/2021    LDLCALC 97 12/22/2019    Physical Findings: AIMS: Facial and Oral Movements Muscles of Facial Expression: None, normal Lips and Perioral Area: None, normal Jaw: None, normal Tongue: None, normal,Extremity Movements Upper (arms, wrists, hands, fingers): None, normal Lower (legs, knees, ankles, toes): None, normal, Trunk Movements Neck, shoulders, hips: None, normal, Overall Severity Severity of abnormal movements (highest score from questions above): None, normal Incapacitation due to abnormal movements: None, normal Patient's awareness of abnormal movements (rate only patient's report): No Awareness, Dental Status Current problems with teeth and/or dentures?: No Does patient usually wear dentures?: No  CIWA:  CIWA-Ar Total: 2 COWS:  COWS Total Score: 2  Musculoskeletal: Strength & Muscle Tone: within normal limits Gait & Station: normal Patient leans: N/A  Psychiatric Specialty Exam:  Presentation  General Appearance: Appropriate for Environment  Eye Contact:Good  Speech:Clear and Coherent; Normal Rate  Speech Volume:Normal  Handedness: No data recorded  Mood and Affect  Mood:Anxious  Affect:Congruent   Thought Process  Thought Processes:Coherent; Goal Directed  Descriptions of Associations:Intact  Orientation:Full (Time, Place and Person)  Thought Content:Logical  History of Schizophrenia/Schizoaffective disorder:No  Duration of Psychotic Symptoms:N/A  Hallucinations:Hallucinations: None  Ideas of Reference:None  Suicidal Thoughts:Suicidal Thoughts: No  Homicidal Thoughts:Homicidal Thoughts: No   Sensorium  Memory:Immediate Good; Recent Good; Remote Good  Judgment:Fair  Insight:Fair   Executive Functions  Concentration:Good  Attention Span:Good  [place] of Knowledge:Good  Language:Good   Psychomotor Activity  Psychomotor Activity:Psychomotor Activity: Normal   Assets  Assets:Communication Skills; Desire for  Improvement; Housing; Vocational/Educational; Social Support; Resilience   Sleep  Sleep:Sleep: Good Number of Hours of Sleep: 5.75    Physical Exam: Physical Exam Vitals and nursing note reviewed.  Constitutional:      General: She is not in acute distress.    Appearance: Normal appearance.  HENT:     Head: Normocephalic and atraumatic.  Pulmonary:     Effort: Pulmonary effort is normal.  Neurological:     General: No focal deficit present.     Mental Status: She is alert and oriented to person, place, and time.   Review of Systems  Constitutional:  Negative for chills, diaphoresis and fever.  HENT:  Negative for sore throat.   Respiratory:  Negative for cough and shortness of breath.   Cardiovascular:  Negative for chest pain and palpitations.  Gastrointestinal:  Negative for constipation, diarrhea,  nausea and vomiting.  Genitourinary: Negative.   Musculoskeletal: Negative.   Neurological:  Negative for dizziness, tremors and headaches.  Psychiatric/Behavioral:  Positive for depression. Negative for hallucinations and suicidal ideas. The patient is nervous/anxious and has insomnia.   All other systems reviewed and are negative. Blood pressure (!) 125/91, pulse (!) 105, temperature 98.7 F (37.1 C), temperature source Oral, resp. rate 16, height 5' 3"  (1.6 m), weight 101.6 kg, last menstrual period 04/07/2021, SpO2 100 %. Body mass index is 39.68 kg/m.   Treatment Plan Summary: Daily contact with patient to assess and evaluate symptoms and progress in treatment and Medication management  Continue IVC status  Continue every 15-minute safety checks  Encouraged participation in group therapy and therapeutic milieu  Depression/anxiety -Increase Lexapro 10 mg daily  -Start gabapentin 100 mg 3 times daily -Continue hydroxyzine 50 mg TID PRN  Insomnia Start melatonin 3 mg QHS standing dose with additional 12m QHS PRN -Continue trazodone 50 mg at bedtime  PRN  Asthma -Continue albuterol inhaler 2 puffs every 6 hours PRN  Discharge planning and progress.  Patient will need referral to outpatient therapist and psychiatrist at the time of discharge.   MArthor Captain MD 04/13/2021, 6:17 PM

## 2021-04-13 NOTE — Progress Notes (Signed)
D:  Patient denied SI and HI, contracts for safety.  Denied A/V hallucinations.  Denied pain. A:  Medications administered per MD orders.  Emotional support and encouragement given patient. R:  Safety maintained with 15 minute checks.  

## 2021-04-13 NOTE — Progress Notes (Signed)
Patient rated her day as a 5 out of 10 since she would rather be at home. Her goal for tomorrow is to get discharged.

## 2021-04-13 NOTE — Plan of Care (Signed)
Nurse discussed coping skills with patient.  

## 2021-04-13 NOTE — Progress Notes (Signed)
Tracie Aguilar is reports anxiety which is unrelieved with Vistaril. Reports she was doing better earlier today but more concerned tonight about wether she will be discharged tomorrow and concerned about her work.  She appears depressed but denies depression. No reported side effects  from Lexapro that has been started.  She is out in the milieu interacting with others and participated in group.

## 2021-04-14 MED ORDER — ALBUTEROL SULFATE HFA 108 (90 BASE) MCG/ACT IN AERS: 2.0000 | INHALATION_SPRAY | Freq: Four times a day (QID) | RESPIRATORY_TRACT | 0 refills | Status: DC | PRN

## 2021-04-14 MED ORDER — TRAZODONE HCL 50 MG PO TABS: 50.0000 mg | ORAL_TABLET | Freq: Every evening | ORAL | 0 refills | Status: DC | PRN

## 2021-04-14 MED ORDER — MELATONIN 3 MG PO TABS: 3.0000 mg | ORAL_TABLET | Freq: Every day | ORAL | 0 refills | Status: DC

## 2021-04-14 MED ORDER — GABAPENTIN 100 MG PO CAPS: 100.0000 mg | ORAL_CAPSULE | Freq: Three times a day (TID) | ORAL | 0 refills | Status: DC

## 2021-04-14 MED ORDER — HYDROXYZINE HCL 50 MG PO TABS: 50.0000 mg | ORAL_TABLET | Freq: Three times a day (TID) | ORAL | 0 refills | Status: DC | PRN

## 2021-04-14 MED ORDER — ESCITALOPRAM OXALATE 10 MG PO TABS: 10.0000 mg | ORAL_TABLET | Freq: Every day | ORAL | 0 refills | Status: DC

## 2021-04-14 NOTE — Progress Notes (Signed)
  [facility] Adult Case Management Discharge Plan :  Will you be returning to the same living situation after discharge:  Yes,  Home  At discharge, do you have transportation home?: Yes,  Safe Transport  Do you have the ability to pay for your medications: Yes,  Family and Community   Release of information consent forms completed and in the chart;  Patient's signature needed at discharge.  Patient to Follow up at:  Follow-up Information     Services, [facility]. Go on 04/15/2021.   Why: You have a hospital follow up appointment for therapy and medication management services on 04/15/21 at 9:00 am.  This appointment will be held in person.  Please bring your photo ID, and proof of any income. Contact information: [address] [place] [address] [phone]                 Next level of care provider has access to [facility]:no  Safety Planning and Suicide Prevention discussed: Yes,  with patient      Has patient been referred to the Quitline?: Patient refused referral  Patient has been referred for addiction treatment: N/A  Aram Beecham, LCSWA 04/14/2021, 9:46 AM

## 2021-04-14 NOTE — [facility] Suicide Risk Assessment (Signed)
[facility] INPATIENT:  Family/Significant Other Suicide Prevention Education  Suicide Prevention Education:  Patient Refusal for Family/Significant Other Suicide Prevention Education: The patient Tracie Aguilar has refused to provide written consent for family/significant other to be provided Family/Significant Other Suicide Prevention Education during admission and/or prior to discharge.  Physician notified.  [facility] Genae Strine 04/14/2021, 11:44 AM
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
## 2021-07-27 ENCOUNTER — Ambulatory Visit: Payer: Medicaid Other | Admitting: Physician Assistant

## 2021-09-01 IMAGING — CT CT HEAD W/O CM
4 series · 16 of 47 positions shown, 18 images · non-contrast
Comparison: No pertinent prior studies available for comparison.

CLINICAL DATA: Headache, acute, normal neuro exam. Additional
history provided: Patient reports headache to left side of head,
associated eye watering, dizziness.

EXAM:
CT HEAD WITHOUT CONTRAST
TECHNIQUE: Contiguous axial images were obtained from the base of the skull
through the vertex without intravenous contrast.

[Series 2: head w o · axial · 0.40mm/px · z∈[+104,+214]mm · 7 of 30 slices shown, 9 images]
[im 4/30  brain]
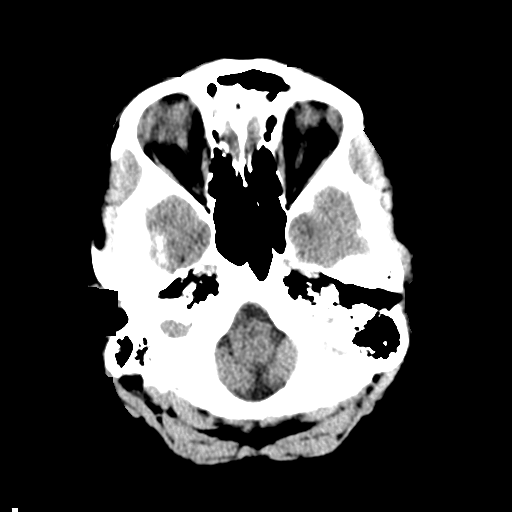
[im 4/30  bone]
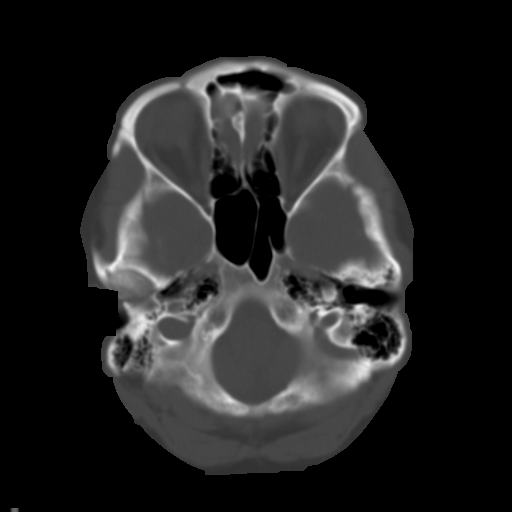
[im 8/30  brain]
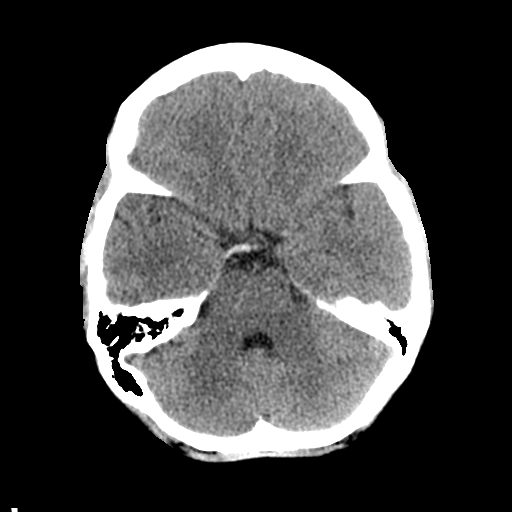
[im 11/30  brain]
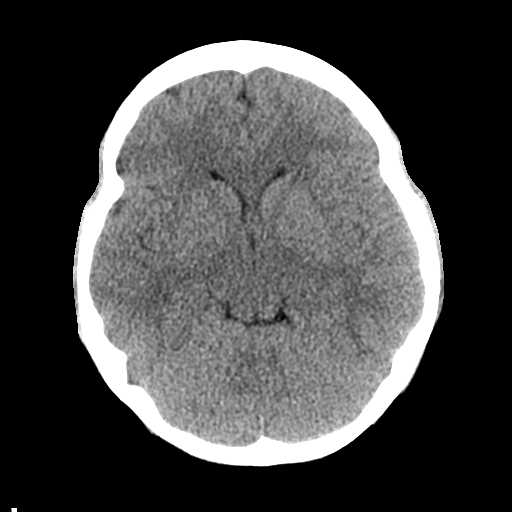
[im 15/30  brain]
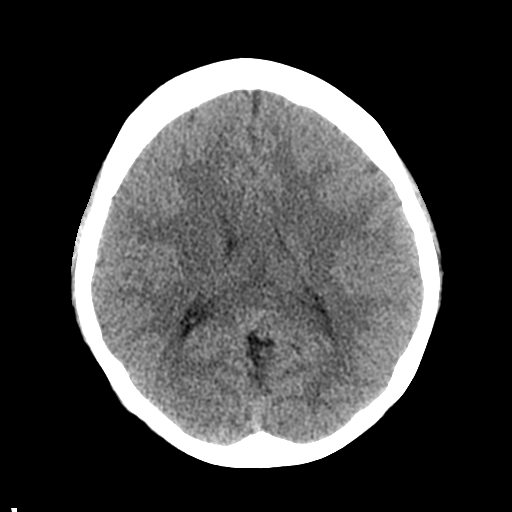
[im 19/30  brain]
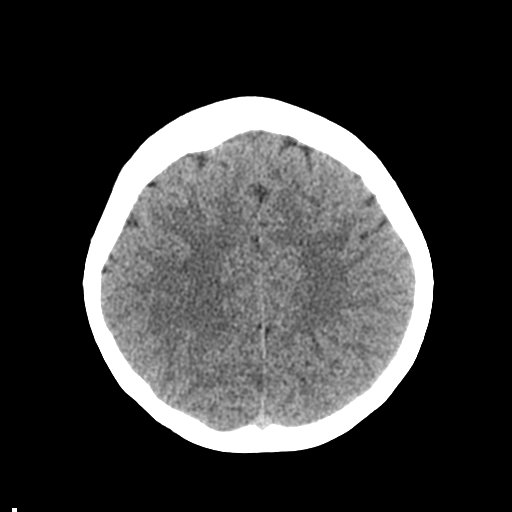
[im 19/30  bone]
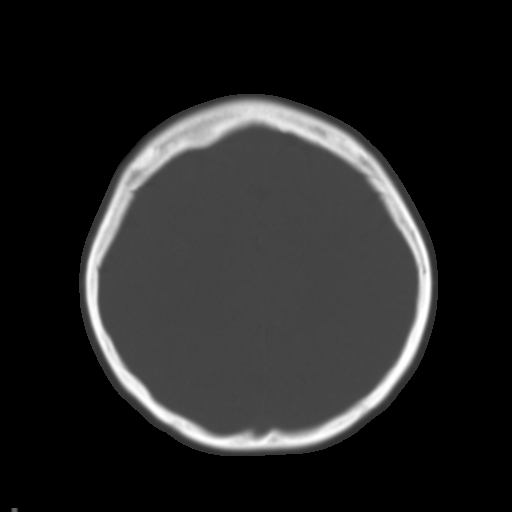
[im 22/30  brain]
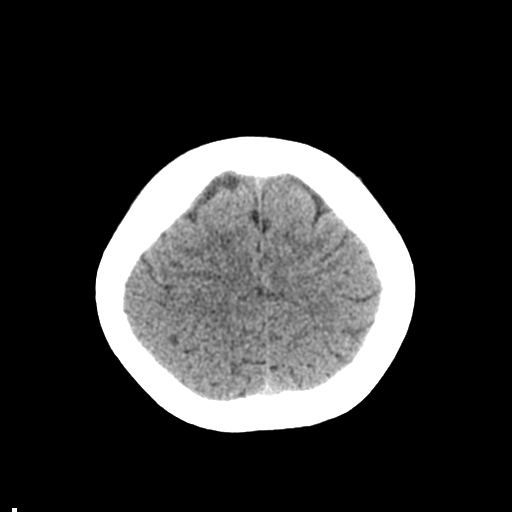
[im 26/30  brain]
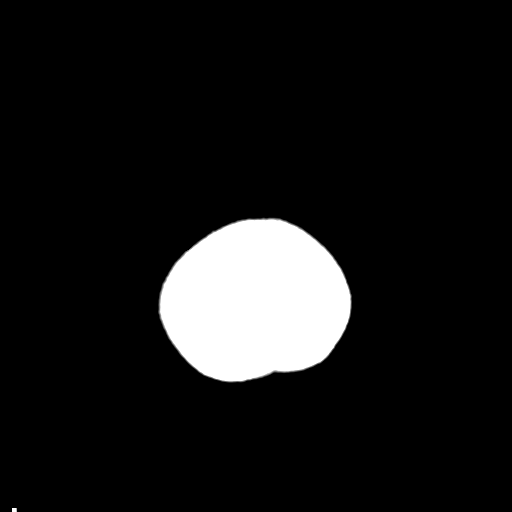

[Series 3: head bone · axial · 0.40mm/px · z∈[+103,+133]mm · 3 of 75 slices shown]
[im 8/75  bone]
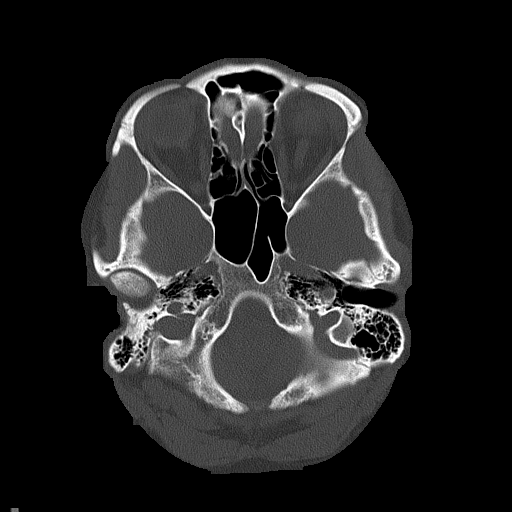
[im 15/75  bone]
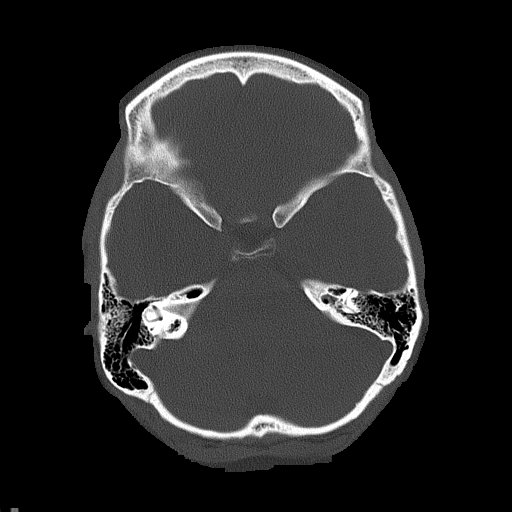
[im 23/75  bone]
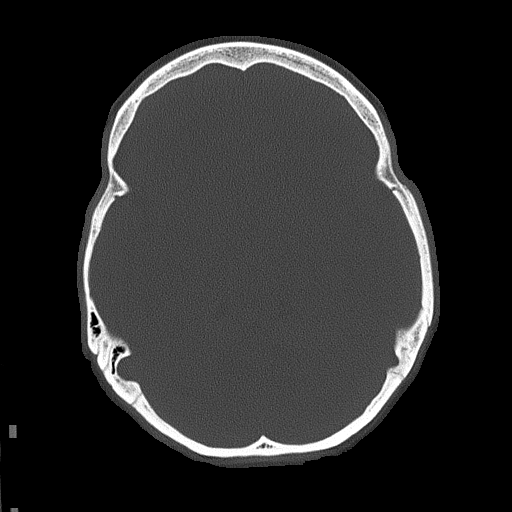

[Series 4: coronal soft · coronal · 0.33mm/px · 3 of 62 slices shown]
[im 21/62  brain]
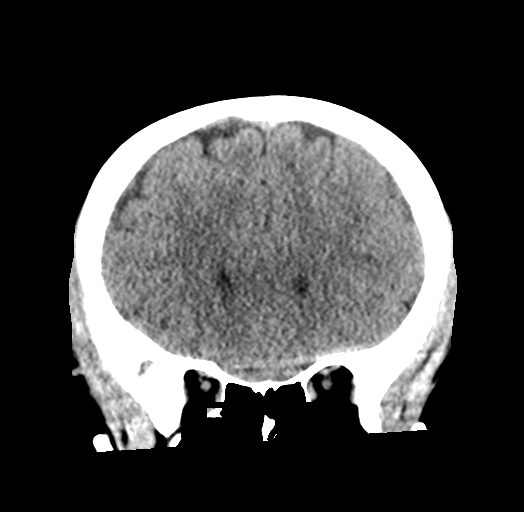
[im 28/62  brain]
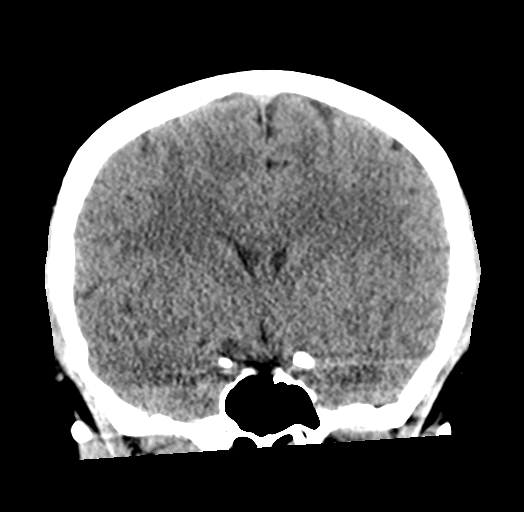
[im 34/62  brain]
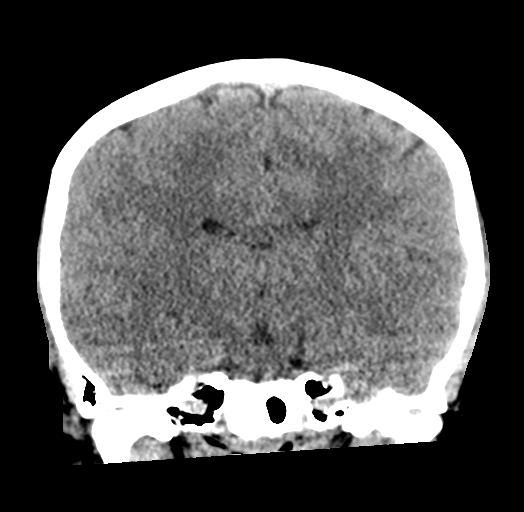

[Series 5: sagittal soft · sagittal · 0.31mm/px · 3 of 59 slices shown]
[im 20/59  brain]
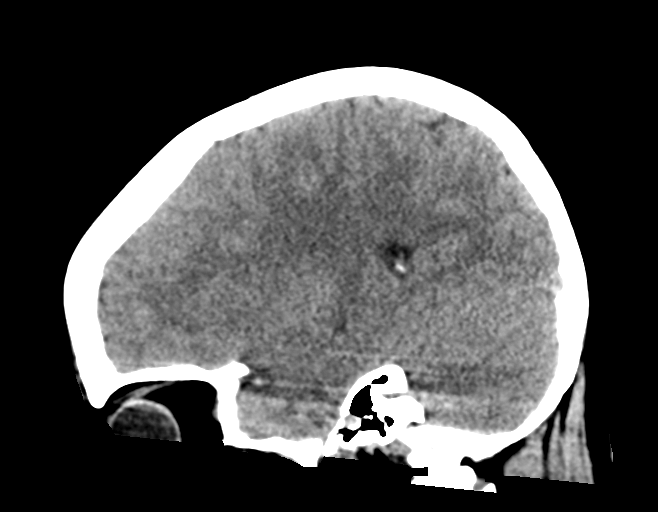
[im 30/59  brain]
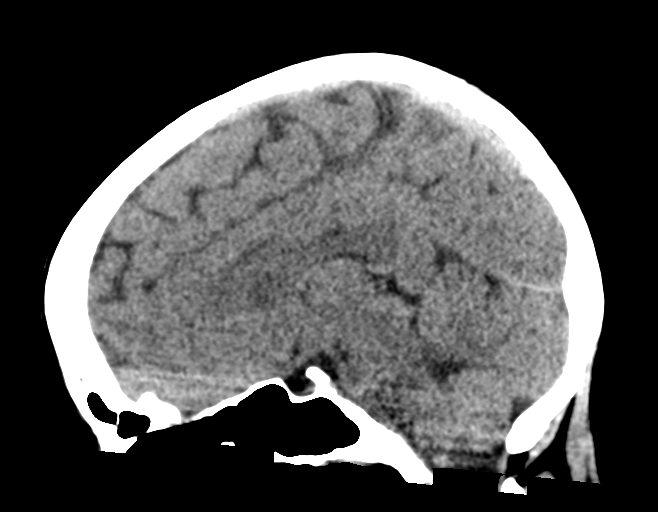
[im 39/59  brain]
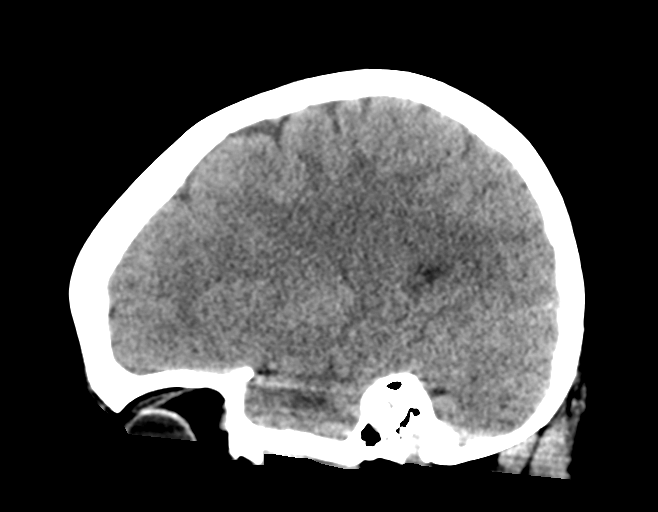

[16 of 47 positions shown; findings below may reference images not displayed]

FINDINGS: Brain:

There is no acute intracranial hemorrhage.

No demarcated cortical infarct.

No extra-axial fluid collection.

No evidence of intracranial mass.

No midline shift.

Partially empty sella turcica.

Vascular: No hyperdense vessel.

Skull: Normal. Negative for fracture or focal lesion.

Sinuses/Orbits: Visualized orbits show no acute finding. No
significant paranasal sinus disease or mastoid effusion at the
imaged levels.
IMPRESSION: Partially empty sella turcica. This is very commonly an incidental
finding, but may be seen in the setting of idiopathic intracranial
hypertension.

Otherwise, unremarkable head CT without evidence of acute
intracranial abnormality.

## 2021-10-07 ENCOUNTER — Telehealth: Payer: Self-pay

## 2021-10-07 NOTE — Telephone Encounter (Signed)
Client recently renewed her [facility] and would like to return receiving primary care with the [facility].  Assisted client to make a new/old patient appointment to re-establish care. Appointment made for 10/18/21 at 0900/ text reminder sent to client with her permission.  Client's concern is that the last couple of times she has attempted to donate plasma she was told her "protein was too high" she is concerned regarding the cause.  She is currently on no medications.  She shared that in past was being treated for depression by Dr. Geanie Cooley at [facility] however the medication caused unwarranted side affects such as rage. Client reports feeling much better while not on that medication.(Medication unknown at present). [facility] with [facility] that [facility] on Tuesdays and wednesdays for counseling.,If she wanted to discuss that at her visit with her provider as an option. Client reports she is taking classes and is on break currently until January.  No other needs currently per client. Will plan follow up after her visit 10/18/21.  Francee Nodal RN [employer]

## 2021-10-18 ENCOUNTER — Encounter: Payer: Self-pay | Admitting: Physician Assistant

## 2021-10-18 ENCOUNTER — Other Ambulatory Visit: Payer: Self-pay

## 2021-10-18 ENCOUNTER — Telehealth: Payer: Self-pay

## 2021-10-18 ENCOUNTER — Ambulatory Visit: Payer: Medicaid Other | Admitting: Physician Assistant

## 2021-10-18 ENCOUNTER — Other Ambulatory Visit (HOSPITAL_COMMUNITY)
Admission: RE | Admit: 2021-10-18 | Discharge: 2021-10-18 | Disposition: A | Discharge status: Home / Self Care | Payer: Medicaid Other | Source: Ambulatory Visit | Attending: Physician Assistant | Admitting: Physician Assistant

## 2021-10-18 VITALS — BP 125/70 | HR 95 | Temp 97.1°F | Ht 63.5 in | Wt 230.5 lb

## 2021-10-18 DIAGNOSIS — R079 Chest pain, unspecified: Secondary | ICD-10-CM | POA: Insufficient documentation

## 2021-10-18 DIAGNOSIS — F419 Anxiety disorder, unspecified: Secondary | ICD-10-CM

## 2021-10-18 DIAGNOSIS — Z7689 Persons encountering health services in other specified circumstances: Secondary | ICD-10-CM

## 2021-10-18 LAB — COMPREHENSIVE METABOLIC PANEL
ALT: 14 U/L (ref 0–44)
AST: 13 U/L — ABNORMAL LOW (ref 15–41)
Albumin: 3.7 g/dL (ref 3.5–5.0)
Alkaline Phosphatase: 84 U/L (ref 38–126)
Anion gap: 6 (ref 5–15)
BUN: 13 mg/dL (ref 6–20)
CO2: 21 mmol/L — ABNORMAL LOW (ref 22–32)
Calcium: 8.6 mg/dL — ABNORMAL LOW (ref 8.9–10.3)
Chloride: 108 mmol/L (ref 98–111)
Creatinine, Ser: 0.52 mg/dL (ref 0.44–1.00)
GFR, Estimated: >60 mL/min (ref >60)
Glucose, Bld: 101 mg/dL — ABNORMAL HIGH (ref 70–99)
Potassium: 4.2 mmol/L (ref 3.5–5.1)
Sodium: 135 mmol/L (ref 135–145)
Total Bilirubin: 0.2 mg/dL — ABNORMAL LOW (ref 0.3–1.2)
Total Protein: 7 g/dL (ref 6.5–8.1)

## 2021-10-18 MED ORDER — FLUOXETINE HCL 20 MG PO CAPS: 20.0000 mg | ORAL_CAPSULE | Freq: Every day | ORAL | 0 refills | Status: DC

## 2021-10-18 NOTE — Progress Notes (Signed)
BP 125/70    Pulse 95    Temp (!) 97.1 F (36.2 C)    Ht 5' 3.5" (1.613 m)    Wt 230 lb 8 oz (104.6 kg)    SpO2 98%    BMI 40.19 kg/m    Subjective:    Patient ID: Tracie Aguilar, female    DOB: 07/09/1986, 35 y.o.   MRN: [id number]  HPI: Tracie Aguilar is a 35 y.o. female presenting on 10/18/2021 for New Patient (Initial Visit)   HPI  Chief Complaint  Patient presents with   New Patient (Initial Visit)     Pt is 35yoF who presents to re-establish care.  She was Last seen here 09/08/2020.    Pt was hospitalized in June with SI.  She is not going anywehre for MH since June.  She didn't like the meds. She said she felt like it made her symptoms worse.   She was admitted to [facility] hospital in June.  She says she went to [facility] once after that but didn't go back again.   She doesn't work.  She is in school full-time.  She says this is her last semester.  She has 3 children that live with her.  She is a single mom.  She has been selling her plasma and says that she was told her protein in her blood was too low and then too hgigh..     She says she has sharp shooting pains in her chest intermittently every day for several months.   She has no sob and her pains are not exertional.   She has not gotten the covid vaccination.  When asked about medications she has taken, Pt says Prozac worked well but it made her talk too fast.    She didn't like gabapentin.  she Was also on lexapro and hydroxyzine;  those mede her feel rages.     Relevant past medical, surgical, family and social history reviewed and updated as indicated. Interim medical history since our last visit reviewed. Allergies and medications reviewed and updated.  CURRENT MEDS: None   Review of Systems  Per HPI unless specifically indicated above     Objective:    BP 125/70    Pulse 95    Temp (!) 97.1 F (36.2 C)    Ht 5' 3.5" (1.613 m)    Wt 230 lb 8 oz (104.6 kg)    SpO2 98%    BMI 40.19 kg/m   Wt  Readings from Last 3 Encounters:  10/18/21 230 lb 8 oz (104.6 kg)  04/09/21 230 lb (104.3 kg)  09/08/20 228 lb (103.4 kg)    Physical Exam Vitals reviewed.  Constitutional:      General: She is not in acute distress.    Appearance: She is well-developed. She is obese. She is not ill-appearing.  HENT:     Head: Normocephalic and atraumatic.     Right Ear: Tympanic membrane, ear canal and external ear normal.     Left Ear: Tympanic membrane, ear canal and external ear normal.  Eyes:     Extraocular Movements: Extraocular movements intact.     Conjunctiva/sclera: Conjunctivae normal.     Pupils: Pupils are equal, round, and reactive to light.  Neck:     Thyroid: No thyromegaly.  Cardiovascular:     Rate and Rhythm: Normal rate and regular rhythm.  Pulmonary:     Effort: Pulmonary effort is normal.     Breath sounds: Normal breath  sounds.  Abdominal:     General: Bowel sounds are normal.     Palpations: Abdomen is soft. There is no mass.     Tenderness: There is no abdominal tenderness.  Musculoskeletal:     Cervical back: Neck supple.     Right lower leg: No edema.     Left lower leg: No edema.  Lymphadenopathy:     Cervical: No cervical adenopathy.  Skin:    General: Skin is warm and dry.  Neurological:     Mental Status: She is alert and oriented to person, place, and time.     Motor: No weakness or tremor.     Gait: Gait is intact. Gait normal.     Deep Tendon Reflexes:     Reflex Scores:      Patellar reflexes are 2+ on the right side and 2+ on the left side. Psychiatric:        Attention and Perception: Attention normal.        Speech: Speech normal.        Behavior: Behavior normal. Behavior is cooperative.     EKG- NSR- poor quality.  No changes compared with EKG from April 11, 2021  PHQ-9 score  1 GAD-7 score   4     Assessment & Plan:   Encounter Diagnoses  Name Primary?   Encounter to establish care Yes   Anxiety    Chest pain, unspecified type     Morbid obesity (HCC)      -EKG one in office was unremarkable.   Discussed with pt likely anxiety related.  She should notify office if her symptoms change or worsen -will Check cmp due to pt issues with her blood protein -Discussed medications- trial of sertraline.  She prefers to just go back on prozac.   Schedule with [facility] tomorrow at 10am.   -F/u with me 3 wk.  She is to contact office sooner prn

## 2021-10-18 NOTE — Telephone Encounter (Signed)
Attempted call to client for follow up after [facility] appointment today. No answer. Left voicemail. Noted client to see [facility] 10/19/21 at 10am  Follow up with provider 11/08/20.  Francee Nodal RN [facility]

## 2021-10-19 ENCOUNTER — Ambulatory Visit: Payer: Medicaid Other | Admitting: Licensed Clinical Social Worker

## 2021-10-19 DIAGNOSIS — F32A Depression, unspecified: Secondary | ICD-10-CM

## 2021-10-19 NOTE — Progress Notes (Signed)
[facility] engaged patient in initial [facility] session. [facility] provided reflective listening and validation as patient shared about past traumas, current stressors, and current depression symptoms. [facility] assessed patient's current coping skills and support system.

## 2021-11-01 ENCOUNTER — Ambulatory Visit: Payer: Medicaid Other | Admitting: Licensed Clinical Social Worker

## 2021-11-01 ENCOUNTER — Other Ambulatory Visit: Payer: Self-pay

## 2021-11-01 DIAGNOSIS — F32A Depression, unspecified: Secondary | ICD-10-CM

## 2021-11-01 NOTE — Progress Notes (Signed)
[facility] engaged patient in follow-up session. [facility] provided mirroring and validation as patient shared about depression symptoms and interpersonal relationships. [facility] led patient in progressive muscle relaxation.

## 2021-11-08 ENCOUNTER — Ambulatory Visit: Payer: Medicaid Other | Admitting: Physician Assistant

## 2021-11-08 ENCOUNTER — Encounter: Payer: Self-pay | Admitting: Physician Assistant

## 2021-11-08 DIAGNOSIS — F419 Anxiety disorder, unspecified: Secondary | ICD-10-CM

## 2021-11-08 NOTE — Progress Notes (Signed)
° °  There were no vitals taken for this visit.   Subjective:    Patient ID: Tracie Aguilar, female    DOB: December 24, 1985, 36 y.o.   MRN: [id number]  HPI: Tracie Aguilar is a 36 y.o. female presenting on 11/08/2021 for No chief complaint on file.   HPI  This is a telemedicine appointment through [employer]  I connected with  [name] on 11/08/21 by a video enabled telemedicine application and verified that I am speaking with the correct person using two identifiers.   I discussed the limitations of evaluation and management by telemedicine. The patient expressed understanding and agreed to proceed.  Pt is at home.  Provider is at office.   Pt is 35yoF with routine appointment to follow up mood.  Pt says she Hasn't picked up prozac yet.  She says she is just still very hesitant of the side effects which she describes as it Makes her talk too fast.  She is having counseling with [facility] which she says is helpful.  Pt denies SI, HI.   Pt says her Classes just started last week and she is looking forward to graduating in May    Pt has not gotten the covid vaccination.     Relevant past medical, surgical, family and social history reviewed and updated as indicated. Interim medical history since our last visit reviewed. Allergies and medications reviewed and updated.  CURRENT MEDS: none  Review of Systems  Per HPI unless specifically indicated above     Objective:    There were no vitals taken for this visit.  Wt Readings from Last 3 Encounters:  10/18/21 230 lb 8 oz (104.6 kg)  04/09/21 230 lb (104.3 kg)  09/08/20 228 lb (103.4 kg)    Physical Exam Constitutional:      General: She is not in acute distress.    Appearance: She is not ill-appearing.  HENT:     Head: Normocephalic and atraumatic.  Pulmonary:     Effort: Pulmonary effort is normal. No respiratory distress.  Neurological:     Mental Status: She is alert and oriented to person, place, and time.   Psychiatric:        Attention and Perception: Attention normal.        Speech: Speech normal.        Behavior: Behavior normal. Behavior is cooperative.         Assessment & Plan:    Encounter Diagnosis  Name Primary?   Anxiety Yes     -Pt prefers to not take meds at this time.  She will continue with counseling.  She says she is doing fine.  She agrees to contact office if she changes her mind and decides she wants to get back on medication.   -pt educated and encouraged to get covid vaccination -Pt to follow up 6 months.  She is to contact office sooner prn

## 2021-11-15 ENCOUNTER — Ambulatory Visit: Payer: Medicaid Other | Admitting: Licensed Clinical Social Worker

## 2021-11-29 ENCOUNTER — Ambulatory Visit: Payer: Medicaid Other | Admitting: Licensed Clinical Social Worker

## 2021-12-29 ENCOUNTER — Other Ambulatory Visit (HOSPITAL_COMMUNITY)
Admission: RE | Admit: 2021-12-29 | Discharge: 2021-12-29 | Disposition: A | Discharge status: Home / Self Care | Payer: Medicaid Other | Source: Ambulatory Visit | Attending: Physician Assistant | Admitting: Physician Assistant

## 2021-12-29 ENCOUNTER — Other Ambulatory Visit: Payer: Self-pay

## 2021-12-29 ENCOUNTER — Telehealth: Payer: Self-pay

## 2021-12-29 ENCOUNTER — Encounter: Payer: Self-pay | Admitting: Physician Assistant

## 2021-12-29 ENCOUNTER — Ambulatory Visit: Payer: Medicaid Other | Admitting: Physician Assistant

## 2021-12-29 VITALS — BP 120/76 | HR 88 | Temp 97.8°F | Wt 218.0 lb

## 2021-12-29 DIAGNOSIS — R1084 Generalized abdominal pain: Secondary | ICD-10-CM

## 2021-12-29 DIAGNOSIS — R300 Dysuria: Secondary | ICD-10-CM | POA: Insufficient documentation

## 2021-12-29 DIAGNOSIS — Z960 Presence of urogenital implants: Secondary | ICD-10-CM | POA: Insufficient documentation

## 2021-12-29 DIAGNOSIS — Z87448 Personal history of other diseases of urinary system: Secondary | ICD-10-CM

## 2021-12-29 LAB — POCT URINALYSIS DIPSTICK
Blood, UA: NEGATIVE
Glucose, UA: NEGATIVE
Ketones, UA: NEGATIVE
Leukocytes, UA: NEGATIVE
Nitrite, UA: NEGATIVE
Protein, UA: POSITIVE — AB
Spec Grav, UA: 1.02 (ref 1.010–1.025)
Urobilinogen, UA: 0.2 E.U./dL
pH, UA: 6.5 (ref 5.0–8.0)

## 2021-12-29 LAB — CBC WITH DIFFERENTIAL/PLATELET
Abs Immature Granulocytes: 0.02 10*3/uL (ref 0.00–0.07)
Basophils Absolute: 0.1 10*3/uL (ref 0.0–0.1)
Basophils Relative: 1 %
Eosinophils Absolute: 0.1 10*3/uL (ref 0.0–0.5)
Eosinophils Relative: 1 %
HCT: 38.7 % (ref 36.0–46.0)
Hemoglobin: 13 g/dL (ref 12.0–15.0)
Immature Granulocytes: 0 %
Lymphocytes Relative: 26 %
Lymphs Abs: 2.4 10*3/uL (ref 0.7–4.0)
MCH: 29.1 pg (ref 26.0–34.0)
MCHC: 33.6 g/dL (ref 30.0–36.0)
MCV: 86.6 fL (ref 80.0–100.0)
Monocytes Absolute: 0.5 10*3/uL (ref 0.1–1.0)
Monocytes Relative: 6 %
Neutro Abs: 6.3 10*3/uL (ref 1.7–7.7)
Neutrophils Relative %: 66 %
Platelets: 348 10*3/uL (ref 150–400)
RBC: 4.47 MIL/uL (ref 3.87–5.11)
RDW: 13.7 % (ref 11.5–15.5)
WBC: 9.3 10*3/uL (ref 4.0–10.5)
nRBC: 0 % (ref 0.0–0.2)

## 2021-12-29 LAB — COMPREHENSIVE METABOLIC PANEL
ALT: 16 U/L (ref 0–44)
AST: 17 U/L (ref 15–41)
Albumin: 3.7 g/dL (ref 3.5–5.0)
Alkaline Phosphatase: 76 U/L (ref 38–126)
Anion gap: 4 — ABNORMAL LOW (ref 5–15)
BUN: 13 mg/dL (ref 6–20)
CO2: 24 mmol/L (ref 22–32)
Calcium: 9.1 mg/dL (ref 8.9–10.3)
Chloride: 107 mmol/L (ref 98–111)
Creatinine, Ser: 0.56 mg/dL (ref 0.44–1.00)
GFR, Estimated: >60 mL/min (ref >60)
Glucose, Bld: 106 mg/dL — ABNORMAL HIGH (ref 70–99)
Potassium: 3.8 mmol/L (ref 3.5–5.1)
Sodium: 135 mmol/L (ref 135–145)
Total Bilirubin: 0.3 mg/dL (ref 0.3–1.2)
Total Protein: 7 g/dL (ref 6.5–8.1)

## 2021-12-29 NOTE — Progress Notes (Signed)
? ?BP 120/76   Pulse 88   Temp 97.8 ?F (36.6 ?C)   Wt 218 lb (98.9 kg)   SpO2 98%   BMI 38.01 kg/m?   ? ?Subjective:  ? ? Patient ID: Tracie Aguilar, female    DOB: April 22, 1986, 36 y.o.   MRN: [id number] ? ?HPI: ?Tracie Aguilar is a 36 y.o. female presenting on 12/29/2021 for Dysuria ? ? ?HPI ? ?Pt is 35yoF who presents today with urinary complaint and abdominal pain.   She says she started with Decreased urination started yestday.  She reports Abd pain for months.   It is Constant on the left side.  It got worse yesterday.  It seems Not as bad today.  When asked where she hurts, she points to the Left side  mid way between upper and lower quadrants. ?She says her kidneys haven't felt the same since her last kidney stone.  Her last kidney stone was may 2021.  She has no burning with urination.   ?She drinks no more than 1 soda a day.  She drinks mostly water.  She tries to drink 4 bottles/day.   ?She is eating normally.  BMs normal and regular.  No blood in stool.   ?She urinates less than once/hour.   ?Her last kidney stone she had ureteroscopic stone extraction.   She has left ureteral stent.   ? ?she sounds congested but denies congestion. ? ? ?Relevant past medical, surgical, family and social history reviewed and updated as indicated. Interim medical history since our last visit reviewed. ?Allergies and medications reviewed and updated. ? ?No current outpatient medications on file. ? ? ? ?Review of Systems ? ?Per HPI unless specifically indicated above ? ?   ?Objective:  ?  ?BP 120/76   Pulse 88   Temp 97.8 ?F (36.6 ?C)   Wt 218 lb (98.9 kg)   SpO2 98%   BMI 38.01 kg/m?   ?Wt Readings from Last 3 Encounters:  ?12/29/21 218 lb (98.9 kg)  ?10/18/21 230 lb 8 oz (104.6 kg)  ?04/09/21 230 lb (104.3 kg)  ?  ?Physical Exam ?Vitals reviewed.  ?Constitutional:   ?   General: She is not in acute distress. ?   Appearance: She is well-developed. She is obese. She is not toxic-appearing.  ?HENT:  ?   Head:  Normocephalic and atraumatic.  ?Cardiovascular:  ?   Rate and Rhythm: Normal rate and regular rhythm.  ?Pulmonary:  ?   Effort: Pulmonary effort is normal.  ?   Breath sounds: Normal breath sounds.  ?Abdominal:  ?   General: Bowel sounds are normal.  ?   Palpations: Abdomen is soft. There is no mass.  ?   Tenderness: There is no abdominal tenderness. There is no right CVA tenderness, left CVA tenderness, guarding or rebound.  ?   Comments: Non-tender to palpation  ?Musculoskeletal:  ?   Cervical back: Neck supple.  ?   Right lower leg: No edema.  ?   Left lower leg: No edema.  ?Lymphadenopathy:  ?   Cervical: No cervical adenopathy.  ?Skin: ?   General: Skin is warm and dry.  ?Neurological:  ?   Mental Status: She is alert and oriented to person, place, and time.  ?Psychiatric:     ?   Behavior: Behavior normal.  ? ? ? ? ?Urinalysis ?   ?Component Value Date/Time  ? COLORURINE YELLOW 04/09/2021 2253  ? APPEARANCEUR CLEAR 04/09/2021 2253  ? LABSPEC 1.009 04/09/2021  [place] 6.0 04/09/2021 2253  ? GLUCOSEU NEGATIVE 04/09/2021 2253  ? HGBUR NEGATIVE 04/09/2021 2253  ? BILIRUBINUR small 12/29/2021 0946  ? [place] NEGATIVE 04/09/2021 2253  ? PROTEINUR Positive (A) 12/29/2021 0946  ? PROTEINUR NEGATIVE 04/09/2021 2253  ? UROBILINOGEN 0.2 12/29/2021 0946  ? UROBILINOGEN 0.2 05/24/2015 1250  ? NITRITE negative 12/29/2021 0946  ? NITRITE NEGATIVE 04/09/2021 2253  ? LEUKOCYTESUR Negative 12/29/2021 0946  ? LEUKOCYTESUR NEGATIVE 04/09/2021 2253  ? ? ? ? ? ?   ?Assessment & Plan:  ? ?Encounter Diagnoses  ?Name Primary?  ? Generalized abdominal pain Yes  ? Ureteral stent present   ? History of hydronephrosis   ? Dysuria   ? ? ? ? ?-Check cmp and cbc today ?-CT abdomen ordered.  Will call pt with CT appt time/date ?-pt encouraged to drink plenty of water ?-she is to contact office if worsens or if she develops new symptoms ? ?

## 2021-12-29 NOTE — Telephone Encounter (Signed)
Called pt & informed of appt date & time, informed no food 4 hrs before & informed to pick up prep from hospital before CT done, pt voiced understanding, informed to call if she needs appt moved up if possible. ?

## 2022-01-02 ENCOUNTER — Encounter: Payer: Self-pay | Admitting: Physician Assistant

## 2022-02-08 ENCOUNTER — Ambulatory Visit (HOSPITAL_COMMUNITY)
Admission: RE | Admit: 2022-02-08 | Discharge: 2022-02-08 | Disposition: A | Discharge status: Home / Self Care | Payer: Self-pay | Source: Ambulatory Visit | Attending: Physician Assistant | Admitting: Physician Assistant

## 2022-02-08 DIAGNOSIS — R1084 Generalized abdominal pain: Secondary | ICD-10-CM

## 2022-02-23 ENCOUNTER — Encounter: Payer: Self-pay | Admitting: Physician Assistant

## 2022-03-16 ENCOUNTER — Encounter: Payer: Self-pay | Admitting: Physician Assistant

## 2022-03-16 ENCOUNTER — Ambulatory Visit: Payer: Medicaid Other | Admitting: Physician Assistant

## 2022-03-16 VITALS — BP 125/82 | HR 100 | Temp 98.0°F | Wt 208.0 lb

## 2022-03-16 DIAGNOSIS — F419 Anxiety disorder, unspecified: Secondary | ICD-10-CM

## 2022-03-16 MED ORDER — FLUOXETINE HCL 20 MG PO CAPS: 20.0000 mg | ORAL_CAPSULE | Freq: Every day | ORAL | 0 refills | Status: DC

## 2022-03-16 NOTE — Progress Notes (Signed)
BP 125/82   Pulse 100   Temp 98 F (36.7 C)   Wt 208 lb (94.3 kg)   SpO2 98%   BMI 36.27 kg/m    Subjective:    Patient ID: Tracie Aguilar, female    DOB: [birth year], 36 y.o.   MRN: [id number]  HPI: Tracie Aguilar is a 36 y.o. female presenting on 03/16/2022 for mood   HPI   Chief Complaint  Patient presents with   mood      She just graduated.  She will be looking a job now.   She restarted prozac about 3 weeks ago.  She says she can feel like she is taking it but knows it's not to it's full effect yet.  She says she has no bad SE but her mind has slowed down a bit.  She is sleeping okay.  No SI, HI.   She is interested in seeing a counselor.    Relevant past medical, surgical, family and social history reviewed and updated as indicated. Interim medical history since our last visit reviewed. Allergies and medications reviewed and updated.    Current Outpatient Medications:    FLUoxetine (PROZAC) 20 MG tablet, Take 20 mg by mouth daily., Disp: , Rfl:    Review of Systems  Per HPI unless specifically indicated above     Objective:    BP 125/82   Pulse 100   Temp 98 F (36.7 C)   Wt 208 lb (94.3 kg)   SpO2 98%   BMI 36.27 kg/m   Wt Readings from Last 3 Encounters:  03/16/22 208 lb (94.3 kg)  12/29/21 218 lb (98.9 kg)  10/18/21 230 lb 8 oz (104.6 kg)    Physical Exam Vitals reviewed.  Constitutional:      General: She is not in acute distress.    Appearance: She is well-developed. She is obese. She is not toxic-appearing.  HENT:     Head: Normocephalic and atraumatic.  Cardiovascular:     Rate and Rhythm: Normal rate and regular rhythm.  Pulmonary:     Effort: Pulmonary effort is normal.     Breath sounds: Normal breath sounds.  Abdominal:     General: Bowel sounds are normal.     Palpations: Abdomen is soft. There is no mass.     Tenderness: There is no abdominal tenderness.  Musculoskeletal:     Cervical back: Neck supple.      Right lower leg: No edema.     Left lower leg: No edema.  Lymphadenopathy:     Cervical: No cervical adenopathy.  Skin:    General: Skin is warm and dry.  Neurological:     Mental Status: She is alert and oriented to person, place, and time.     Motor: No weakness or tremor.     Gait: Gait is intact.     Deep Tendon Reflexes:     Reflex Scores:      Patellar reflexes are 2+ on the right side and 2+ on the left side. Psychiatric:        Attention and Perception: Attention normal.        Mood and Affect: Affect is not inappropriate.        Speech: Speech normal.        Behavior: Behavior normal. Behavior is cooperative.     GAD 7- 15 PHQ 9 - 6          Assessment & Plan:    Encounter Diagnosis  Name Primary?   Anxiety and depression Yes     -Continue prozac -She is r/s with [facility] -pt was encouraged to get covid vaccination -F/u 3-4 wk.  She is to contact office sooner prn

## 2022-03-16 NOTE — Patient Instructions (Signed)
COVID-19 vaccination significantly lowers your risk of severe illness, hospitalization, and death if you get infected. Compared to people who are up to date with their COVID-19 vaccinations, unvaccinated people aremore likely to get COVID-19, much more likely to be hospitalized with COVID-19, and much more likely to die from COVID-19. ?Like all vaccines, COVID-19 vaccines are not 100% effective at preventing infection. Some people who are up to date with their COVID-19 vaccinations will get COVID-19 breakthrough infection. However, staying up to date with your COVID-19 vaccinations means that you are less likely to have a breakthrough infection and, if you do get sick, you are less likely to get severely ill or die. Staying up to date with COVID-19 vaccination also means you are less likely to spread the disease to others and increases your protection against new variants of SARS-CoV-2, the virus that causes COVID-19. ? ?

## 2022-03-21 ENCOUNTER — Ambulatory Visit: Payer: Medicaid Other | Admitting: Licensed Clinical Social Worker

## 2022-04-11 ENCOUNTER — Ambulatory Visit: Payer: Medicaid Other | Admitting: Physician Assistant

## 2022-04-24 ENCOUNTER — Ambulatory Visit: Payer: Medicaid Other | Admitting: Physician Assistant

## 2022-05-03 ENCOUNTER — Encounter: Payer: Self-pay | Admitting: Physician Assistant

## 2022-05-03 ENCOUNTER — Ambulatory Visit: Payer: Medicaid Other | Admitting: Physician Assistant

## 2022-05-03 VITALS — BP 104/72 | HR 87 | Temp 97.9°F | Wt 219.0 lb

## 2022-05-03 DIAGNOSIS — F419 Anxiety disorder, unspecified: Secondary | ICD-10-CM

## 2022-05-03 MED ORDER — FLUOXETINE HCL 20 MG PO CAPS: 20.0000 mg | ORAL_CAPSULE | Freq: Every day | ORAL | 1 refills | Status: DC

## 2022-05-03 NOTE — Progress Notes (Signed)
   BP 104/72   Pulse 87   Temp 97.9 F (36.6 C)   Wt 219 lb (99.3 kg)   BMI 38.19 kg/m    Subjective:    Patient ID: Tracie Aguilar, female    DOB: 01-Mar-1986, 36 y.o.   MRN: [id number]  HPI: ROCHELE LUECK is a 36 y.o. female presenting on 05/03/2022 for mood   HPI  Chief Complaint  Patient presents with   mood    Pt was Last seen 5/25.  No show to follow up appt 6/20 and she cancelled appt with [facility] for  5/30.  She says she is taking her fluoxetine still.  She says she missed maybe 3 or 4 doses but still has 2 days remaining.    No SI, HI.   She has an interview at 1pm today for a new job.   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     She is sleeping well.  She is Not exercising.     Relevant past medical, surgical, family and social history reviewed and updated as indicated. Interim medical history since our last visit reviewed. Allergies and medications reviewed and updated.   Current Outpatient Medications:    FLUoxetine (PROZAC) 20 MG capsule, Take 1 capsule (20 mg total) by mouth daily., Disp: 30 capsule, Rfl: 0   Review of Systems  Per HPI unless specifically indicated above     Objective:    BP 104/72   Pulse 87   Temp 97.9 F (36.6 C)   Wt 219 lb (99.3 kg)   BMI 38.19 kg/m   Wt Readings from Last 3 Encounters:  05/03/22 219 lb (99.3 kg)  03/16/22 208 lb (94.3 kg)  12/29/21 218 lb (98.9 kg)     O2 sat unable to read due to pt nail polish    Physical Exam Vitals reviewed.  Constitutional:      General: She is not in acute distress.    Appearance: She is well-developed. She is not toxic-appearing.  HENT:     Head: Normocephalic and atraumatic.  Cardiovascular:     Rate and Rhythm: Normal rate and regular rhythm.  Pulmonary:     Effort: Pulmonary effort is normal.     Breath sounds: Normal breath sounds.  Abdominal:     General: Bowel sounds are normal.     Palpations: Abdomen is soft. There is no mass.     Tenderness:  There is no abdominal tenderness.  Musculoskeletal:     Cervical back: Neck supple.     Right lower leg: No edema.     Left lower leg: No edema.  Lymphadenopathy:     Cervical: No cervical adenopathy.  Skin:    General: Skin is warm and dry.  Neurological:     Mental Status: She is alert and oriented to person, place, and time.  Psychiatric:        Attention and Perception: Attention normal.        Mood and Affect: Affect is not inappropriate.        Speech: Speech normal.        Behavior: Behavior normal. Behavior is cooperative.           Assessment & Plan:   Encounter Diagnosis  Name Primary?   Anxiety and depression Yes      Continue prozac.  Pt to follow up 6 weeks.  She is to contact office sooner prn

## 2022-05-12 ENCOUNTER — Emergency Department (HOSPITAL_COMMUNITY): Payer: Self-pay

## 2022-05-12 ENCOUNTER — Other Ambulatory Visit: Payer: Self-pay

## 2022-05-12 ENCOUNTER — Encounter (HOSPITAL_COMMUNITY): Payer: Self-pay | Admitting: Emergency Medicine

## 2022-05-12 ENCOUNTER — Emergency Department (HOSPITAL_COMMUNITY)
Admission: EM | Admit: 2022-05-12 | Discharge: 2022-05-12 | Disposition: A | Discharge status: Home / Self Care | Payer: Self-pay | Attending: Emergency Medicine | Admitting: Emergency Medicine

## 2022-05-12 DIAGNOSIS — R0789 Other chest pain: Secondary | ICD-10-CM | POA: Insufficient documentation

## 2022-05-12 DIAGNOSIS — D72829 Elevated white blood cell count, unspecified: Secondary | ICD-10-CM | POA: Insufficient documentation

## 2022-05-12 DIAGNOSIS — R079 Chest pain, unspecified: Secondary | ICD-10-CM

## 2022-05-12 DIAGNOSIS — R000 Tachycardia, unspecified: Secondary | ICD-10-CM | POA: Insufficient documentation

## 2022-05-12 DIAGNOSIS — R600 Localized edema: Secondary | ICD-10-CM | POA: Insufficient documentation

## 2022-05-12 DIAGNOSIS — Z859 Personal history of malignant neoplasm, unspecified: Secondary | ICD-10-CM | POA: Insufficient documentation

## 2022-05-12 DIAGNOSIS — R791 Abnormal coagulation profile: Secondary | ICD-10-CM | POA: Insufficient documentation

## 2022-05-12 LAB — BASIC METABOLIC PANEL
Anion gap: 5 (ref 5–15)
BUN: 16 mg/dL (ref 6–20)
CO2: 23 mmol/L (ref 22–32)
Calcium: 7.8 mg/dL — ABNORMAL LOW (ref 8.9–10.3)
Chloride: 108 mmol/L (ref 98–111)
Creatinine, Ser: 0.71 mg/dL (ref 0.44–1.00)
GFR, Estimated: >60 mL/min (ref >60)
Glucose, Bld: 105 mg/dL — ABNORMAL HIGH (ref 70–99)
Potassium: 3.8 mmol/L (ref 3.5–5.1)
Sodium: 136 mmol/L (ref 135–145)

## 2022-05-12 LAB — CBC
HCT: 41.2 % (ref 36.0–46.0)
Hemoglobin: 13.7 g/dL (ref 12.0–15.0)
MCH: 28.7 pg (ref 26.0–34.0)
MCHC: 33.3 g/dL (ref 30.0–36.0)
MCV: 86.4 fL (ref 80.0–100.0)
Platelets: 343 10*3/uL (ref 150–400)
RBC: 4.77 MIL/uL (ref 3.87–5.11)
RDW: 14.5 % (ref 11.5–15.5)
WBC: 13.3 10*3/uL — ABNORMAL HIGH (ref 4.0–10.5)
nRBC: 0 % (ref 0.0–0.2)

## 2022-05-12 LAB — POC URINE PREG, ED: Preg Test, Ur: NEGATIVE

## 2022-05-12 LAB — D-DIMER, QUANTITATIVE: D-Dimer, Quant: <0.27 ug/mL-FEU (ref 0.00–0.50)

## 2022-05-12 LAB — TROPONIN I (HIGH SENSITIVITY): Troponin I (High Sensitivity): 2 ng/L (ref <18)

## 2022-05-12 MED ORDER — NAPROXEN 500 MG PO TABS: 500.0000 mg | ORAL_TABLET | Freq: Two times a day (BID) | ORAL | 0 refills | Status: DC

## 2022-05-12 NOTE — Discharge Instructions (Signed)
And thank you for allowing [place] to treat you in the emergency department today.  After reviewing your examination and potential testing that was done it appears that you are safe to go home.  I would like for you to follow-up with your doctor within the next several days, have them obtain your results and follow-up with them to review all of these tests.  If you should develop severe or worsening symptoms return to the emergency department immediately  Please take Naprosyn, 500mg  by mouth twice daily as needed for pain - this in an antiinflammatory medicine (NSAID) and is similar to ibuprofen - many people feel that it is stronger than ibuprofen and it is easier to take since it is a smaller pill.  Please use this only for 1 week - if your pain persists, you will need to follow up with your doctor in the office for ongoing guidance and pain control.

## 2022-05-12 NOTE — ED Provider Notes (Signed)
[facility] EMERGENCY DEPARTMENT Provider Note   CSN: [id number] Arrival date & time: 05/12/22  2132     History  Chief Complaint  Patient presents with   Chest Pain    Tracie Aguilar is a 36 y.o. female.   Chest Pain   This patient is a 36 year old female, she states that she has no chronic medical conditions specifically no cardiac or pulmonary abnormalities, she does not smoke, she does not have any history of DVT, pulmonary embolism and has not had any recent travel, trauma, immobilization, surgery, history of cancer, history of tobacco use, has had mild swelling of both of her legs.  She reports 1 day of having a chest heaviness which has been constant throughout the day with some radiation into the left side with some occasional sharp and stabbing pains but does not feel short of breath has not had coughing has not had fevers.  Home Medications Prior to Admission medications   Medication Sig Start Date End Date Taking? Authorizing Provider  naproxen (NAPROSYN) 500 MG tablet Take 1 tablet (500 mg total) by mouth 2 (two) times daily with a meal. 05/12/22  Yes Eber Hong, MD  FLUoxetine (PROZAC) 20 MG capsule Take 1 capsule (20 mg total) by mouth daily. 05/03/22   Jacquelin Hawking, PA-C      Allergies    Sulfa antibiotics    Review of Systems   Review of Systems  Cardiovascular:  Positive for chest pain.  All other systems reviewed and are negative.   Physical Exam Updated Vital Signs BP (!) 92/59   Pulse 83   Temp 98.2 F (36.8 C) (Oral)   Resp 16   Ht 1.6 m (5\' 3" )   Wt 99.3 kg   LMP 04/30/2022   SpO2 97%   BMI 38.79 kg/m  Physical Exam Vitals and nursing note reviewed.  Constitutional:      General: She is not in acute distress.    Appearance: She is well-developed.  HENT:     Head: Normocephalic and atraumatic.     Mouth/Throat:     Pharynx: No oropharyngeal exudate.  Eyes:     General: No scleral icterus.       Right eye: No discharge.         Left eye: No discharge.     Conjunctiva/sclera: Conjunctivae normal.     Pupils: Pupils are equal, round, and reactive to light.  Neck:     Thyroid: No thyromegaly.     Vascular: No JVD.  Cardiovascular:     Rate and Rhythm: Normal rate and regular rhythm.     Heart sounds: Normal heart sounds. No murmur heard.    No friction rub. No gallop.  Pulmonary:     Effort: Pulmonary effort is normal. No respiratory distress.     Breath sounds: Normal breath sounds. No wheezing or rales.  Abdominal:     General: Bowel sounds are normal. There is no distension.     Palpations: Abdomen is soft. There is no mass.     Tenderness: There is no abdominal tenderness.  Musculoskeletal:        General: No tenderness. Normal range of motion.     Cervical back: Normal range of motion and neck supple.     Right lower leg: No tenderness. Edema present.     Left lower leg: No tenderness. Edema present.     Comments: Mild symmetrical swelling of the bilateral pretibial area  Lymphadenopathy:     Cervical:  No cervical adenopathy.  Skin:    General: Skin is warm and dry.     Findings: No erythema or rash.  Neurological:     Mental Status: She is alert.     Coordination: Coordination normal.  Psychiatric:        Behavior: Behavior normal.     ED Results / Procedures / Treatments   Labs (all labs ordered are listed, but only abnormal results are displayed) Labs Reviewed  BASIC METABOLIC PANEL - Abnormal; Notable for the following components:      Result Value   Glucose, Bld 105 (*)    Calcium 7.8 (*)    All other components within normal limits  CBC - Abnormal; Notable for the following components:   WBC 13.3 (*)    All other components within normal limits  D-DIMER, QUANTITATIVE  POC URINE PREG, ED  TROPONIN I (HIGH SENSITIVITY)    EKG EKG Interpretation  Date/Time:  Friday May 12 2022 21:44:00 EDT Ventricular Rate:  102 PR Interval:  168 QRS Duration: 87 QT Interval:  351 QTC  Calculation: 458 R Axis:   79 Text Interpretation: Sinus tachycardia Since last tracing rate faster Confirmed by Eber Hong ([id number]) on 05/12/2022 9:55:15 PM  Radiology DG Chest 1 View  Result Date: 05/12/2022 CLINICAL DATA:  Intermittent chest pain and nausea all day. EXAM: CHEST  1 VIEW COMPARISON:  04/30/2017 FINDINGS: The heart size and mediastinal contours are within normal limits. Both lungs are clear. The visualized skeletal structures are unremarkable. IMPRESSION: No active disease. Electronically Signed   By: Burman Nieves M.D.   On: 05/12/2022 22:09    Procedures Procedures    Medications Ordered in ED Medications - No data to display  ED Course/ Medical Decision Making/ A&P                           Medical Decision Making Amount and/or Complexity of Data Reviewed Labs: ordered. Radiology: ordered.  Risk Prescription drug management.   This patient presents to the ED for concern of chest pain, this involves an extensive number of treatment options, and is a complaint that carries with it a high risk of complications and morbidity.  The differential diagnosis includes acute coronary syndrome seems less likely given the ongoing symptoms throughout the day and the unremarkable EKG with low risk factors.  Would also consider pulmonary embolism, less likely to be pneumonia or pneumothorax or pericarditis   Co morbidities that complicate the patient evaluation  The patient is very obese with a BMI of 38, heart rate is 101 but the patient tells me that she has a history of borderline tachycardia going back a long time   Additional history obtained:  Additional history obtained from medical record External records from outside source obtained and reviewed including medical record from office visits, patient is being treated for anxiety and depression with fluoxetine   Lab Tests:  I Ordered, and personally interpreted labs.  The pertinent results include: CBC  metabolic panel D-dimer and troponin are all unremarkable except for a mild leukocytosis   Imaging Studies ordered:  I ordered imaging studies including chest x-ray I independently visualized and interpreted imaging which showed no acute findings I agree with the radiologist interpretation   Cardiac Monitoring: / EKG:  The patient was maintained on a cardiac monitor.  I personally viewed and interpreted the cardiac monitored which showed an underlying rhythm of: Normal sinus rhythm, tachycardia completely resolved  Problem List / ED Course / Critical interventions / Medication management  Chest pain I ordered medication including Naprosyn for home to treat chest pain Reevaluation of the patient after these medicines showed that the patient stable if not improved in the ER I have reviewed the patients home medicines and have made adjustments as needed   Social Determinants of Health:  None   Test / Admission - Considered:  Considered admission and further testing but the patient has been having chest pain all day and has no source of chest pain, she does not have an elevated D-dimer she does not have an elevated troponin is well-appearing with a normal x-ray.  Stable for discharge         Final Clinical Impression(s) / ED Diagnoses Final diagnoses:  Chest pain, unspecified type    Rx / DC Orders ED Discharge Orders          Ordered    naproxen (NAPROSYN) 500 MG tablet  2 times daily with meals        05/12/22 2338              Eber Hong, MD 05/12/22 2340

## 2022-05-12 NOTE — ED Triage Notes (Signed)
Pt c/o intermittent chest pain with nausea all day.

## 2022-06-14 ENCOUNTER — Ambulatory Visit: Payer: Medicaid Other | Admitting: Physician Assistant

## 2022-06-14 DIAGNOSIS — R079 Chest pain, unspecified: Secondary | ICD-10-CM

## 2022-06-14 DIAGNOSIS — F32A Depression, unspecified: Secondary | ICD-10-CM

## 2022-06-14 NOTE — Progress Notes (Unsigned)
   There were no vitals taken for this visit.   Subjective:    Patient ID: Tracie Aguilar, female    DOB: 1986/01/17, 36 y.o.   MRN: [id number]  HPI: Tracie Aguilar is a 36 y.o. female presenting on 06/14/2022 for No chief complaint on file.   HPI Mood is better She is sellpeing  She is not exercising  Cp- went in July-   no assoc sob.  Sharp pain, sometimes sharp, can feel her heart beating.  Pain lasts 2 days without letting up.  Hurts couple times / weke, lasts for couple hours.    She says IBUdidn't help her pain.     Sometimes gets nausea.     No new edema of LE.   Relevant past medical, surgical, family and social history reviewed and updated as indicated. Interim medical history since our last visit reviewed. Allergies and medications reviewed and updated.   Current Outpatient Medications:    FLUoxetine (PROZAC) 20 MG capsule, Take 1 capsule (20 mg total) by mouth daily., Disp: 30 capsule, Rfl: 1   naproxen (NAPROSYN) 500 MG tablet, Take 1 tablet (500 mg total) by mouth 2 (two) times daily with a meal. (Patient not taking: Reported on 06/14/2022), Disp: 30 tablet, Rfl: 0   Review of Systems  Per HPI unless specifically indicated above     Objective:    There were no vitals taken for this visit.  Wt Readings from Last 3 Encounters:  05/12/22 219 lb (99.3 kg)  05/03/22 219 lb (99.3 kg)  03/16/22 208 lb (94.3 kg)    Physical Exam       Assessment & Plan:    Cont prozac Echo F/u 1 mo

## 2022-06-15 ENCOUNTER — Encounter: Payer: Self-pay | Admitting: Physician Assistant

## 2022-06-15 ENCOUNTER — Telehealth: Payer: Self-pay | Admitting: Physician Assistant

## 2022-06-15 NOTE — Telephone Encounter (Signed)
Pt was called and notified of echo appointment Monday 06/19/22.  She is reminded to submit [employer] financial assistance application

## 2022-06-19 ENCOUNTER — Ambulatory Visit (HOSPITAL_COMMUNITY)
Admission: RE | Admit: 2022-06-19 | Discharge: 2022-06-19 | Disposition: A | Discharge status: Home / Self Care | Payer: Medicaid Other | Source: Ambulatory Visit | Attending: Physician Assistant | Admitting: Physician Assistant

## 2022-06-19 DIAGNOSIS — R079 Chest pain, unspecified: Secondary | ICD-10-CM | POA: Diagnosis present

## 2022-06-19 LAB — ECHOCARDIOGRAM COMPLETE
Area-P 1/2: 4.89 cm2
S' Lateral: 2.4 cm

## 2022-06-19 NOTE — Progress Notes (Signed)
*  PRELIMINARY RESULTS* Echocardiogram 2D Echocardiogram has been performed.  Stacey Drain 06/19/2022, 12:30 PM

## 2022-06-29 ENCOUNTER — Ambulatory Visit: Payer: Medicaid Other | Admitting: Physician Assistant

## 2022-06-29 ENCOUNTER — Encounter: Payer: Self-pay | Admitting: Physician Assistant

## 2022-06-29 VITALS — BP 102/80 | HR 89 | Temp 98.2°F | Wt 214.0 lb

## 2022-06-29 DIAGNOSIS — F32A Depression, unspecified: Secondary | ICD-10-CM

## 2022-06-29 MED ORDER — FLUOXETINE HCL 10 MG PO CAPS: 10.0000 mg | ORAL_CAPSULE | Freq: Every day | ORAL | 1 refills | Status: DC

## 2022-06-29 NOTE — Progress Notes (Unsigned)
   BP 102/80   Pulse 89   Temp 98.2 F (36.8 C)   Wt 214 lb (97.1 kg)   SpO2 98%   BMI 37.91 kg/m    Subjective:    Patient ID: Tracie Aguilar, female    DOB: 19-Jun-1986, 36 y.o.   MRN: [id number]  HPI: Tracie Aguilar is a 35 y.o. female presenting on 06/29/2022 for Chest Pain   HPI  She is still having the CP.  Sporadic.  Last episode this past weeked.  It was some sharp shooting pains that lasted only a few seconds.  Then sometimes she can go for weeks weithout.  CP for several years.  Pain comes on when she is at rest.    Relevant past medical, surgical, family and social history reviewed and updated as indicated. Interim medical history since our last visit reviewed. Allergies and medications reviewed and updated.   Current Outpatient Medications:    FLUoxetine (PROZAC) 20 MG capsule, Take 1 capsule (20 mg total) by mouth daily., Disp: 30 capsule, Rfl: 1   naproxen (NAPROSYN) 500 MG tablet, Take 1 tablet (500 mg total) by mouth 2 (two) times daily with a meal. (Patient not taking: Reported on 06/14/2022), Disp: 30 tablet, Rfl: 0   Review of Systems  Per HPI unless specifically indicated above     Objective:    BP 102/80   Pulse 89   Temp 98.2 F (36.8 C)   Wt 214 lb (97.1 kg)   SpO2 98%   BMI 37.91 kg/m   Wt Readings from Last 3 Encounters:  06/29/22 214 lb (97.1 kg)  05/12/22 219 lb (99.3 kg)  05/03/22 219 lb (99.3 kg)    Physical Exam         Assessment & Plan:      She is interested in [facility] but not at this time.   Increase prozac to 30mg  F/u 1

## 2022-07-11 ENCOUNTER — Ambulatory Visit: Payer: Medicaid Other | Admitting: Physician Assistant

## 2022-08-03 ENCOUNTER — Ambulatory Visit: Payer: Medicaid Other | Admitting: Physician Assistant

## 2022-08-07 ENCOUNTER — Ambulatory Visit: Payer: Medicaid Other | Admitting: Physician Assistant

## 2022-08-07 ENCOUNTER — Other Ambulatory Visit: Payer: Self-pay | Admitting: Physician Assistant

## 2022-08-07 DIAGNOSIS — F32A Depression, unspecified: Secondary | ICD-10-CM

## 2022-08-07 MED ORDER — FLUOXETINE HCL 20 MG PO CAPS: 20.0000 mg | ORAL_CAPSULE | Freq: Every day | ORAL | 0 refills | Status: DC

## 2022-08-07 MED ORDER — FLUOXETINE HCL 10 MG PO CAPS: 10.0000 mg | ORAL_CAPSULE | Freq: Every day | ORAL | 0 refills | Status: DC

## 2022-08-07 NOTE — Progress Notes (Unsigned)
   There were no vitals taken for this visit.   Subjective:    Patient ID: Tracie Aguilar, female    DOB: 01/15/1986, 36 y.o.   MRN: [id number]  HPI: Tracie Aguilar is a 36 y.o. female presenting on 08/07/2022 for No chief complaint on file.   HPI   This is a telemedicine appointment through [employer]  I connected with  Tracie Aguilar on 08/07/22 by a video enabled telemedicine application and verified that I am speaking with the correct person using two identifiers.   I discussed the limitations of evaluation and management by telemedicine. The patient expressed understanding and agreed to proceed.  Pt is at home.  Provider is at office.   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 Doing well.        Relevant past medical, surgical, family and social history reviewed and updated as indicated. Interim medical history since our last visit reviewed. Allergies and medications reviewed and updated.  Review of Systems  Per HPI unless specifically indicated above     Objective:    There were no vitals taken for this visit.  Wt Readings from Last 3 Encounters:  06/29/22 214 lb (97.1 kg)  05/12/22 219 lb (99.3 kg)  05/03/22 219 lb (99.3 kg)    Physical Exam        Assessment & Plan:     Cont prozac 30mg  Discussed insurance/changing PCP

## 2022-08-08 ENCOUNTER — Encounter: Payer: Self-pay | Admitting: Physician Assistant

## 2022-09-04 ENCOUNTER — Ambulatory Visit: Payer: Medicaid Other | Admitting: Physician Assistant

## 2022-09-04 ENCOUNTER — Encounter: Payer: Self-pay | Admitting: Physician Assistant

## 2022-09-04 VITALS — BP 107/68 | HR 90 | Temp 97.8°F | Wt 215.0 lb

## 2022-09-04 DIAGNOSIS — F32A Depression, unspecified: Secondary | ICD-10-CM

## 2022-09-04 MED ORDER — FLUOXETINE HCL 10 MG PO TABS: 10.0000 mg | ORAL_TABLET | Freq: Every day | ORAL | 1 refills | Status: DC

## 2022-09-04 MED ORDER — FLUOXETINE HCL 20 MG PO CAPS
20.0000 mg | ORAL_CAPSULE | Freq: Every day | ORAL | 1 refills | Status: DC
Start: 2022-09-04 — End: 2023-08-29

## 2022-09-04 NOTE — Progress Notes (Unsigned)
   BP 107/68   Pulse 90   Temp 97.8 F (36.6 C)   Wt 215 lb (97.5 kg)   SpO2 96%   BMI 38.09 kg/m    Subjective:    Patient ID: Tracie Aguilar, female    DOB: [birth year], 36 y.o.   MRN: [id number]  HPI: Tracie Aguilar is a 36 y.o. female presenting on 09/04/2022 for mood   HPI  Chief Complaint  Patient presents with   mood    Pt Just started a new job at [employer] on last week.    She will get insurance at some point;  she thinks within 30 days.   She stopped working at her other job.    She says she is doing well, her mood is good, and she has no complaints.    Relevant past medical, surgical, family and social history reviewed and updated as indicated. Interim medical history since our last visit reviewed. Allergies and medications reviewed and updated.   Current Outpatient Medications:    FLUoxetine (PROZAC) 10 MG tablet, Take 1 tablet (10 mg total) by mouth daily., Disp: 30 tablet, Rfl: 1   FLUoxetine (PROZAC) 20 MG capsule, Take 1 capsule (20 mg total) by mouth daily., Disp: 30 capsule, Rfl: 1    Review of Systems  Per HPI unless specifically indicated above     Objective:    BP 107/68   Pulse 90   Temp 97.8 F (36.6 C)   Wt 215 lb (97.5 kg)   SpO2 96%   BMI 38.09 kg/m   Wt Readings from Last 3 Encounters:  09/04/22 215 lb (97.5 kg)  06/29/22 214 lb (97.1 kg)  05/12/22 219 lb (99.3 kg)    Physical Exam Vitals reviewed.  Constitutional:      General: She is not in acute distress.    Appearance: She is well-developed. She is not toxic-appearing.  HENT:     Head: Normocephalic and atraumatic.  Cardiovascular:     Rate and Rhythm: Normal rate and regular rhythm.  Pulmonary:     Effort: Pulmonary effort is normal.     Breath sounds: Normal breath sounds.  Abdominal:     General: Bowel sounds are normal.     Palpations: Abdomen is soft. There is no mass.     Tenderness: There is no abdominal tenderness.  Musculoskeletal:     Cervical  back: Neck supple.  Lymphadenopathy:     Cervical: No cervical adenopathy.  Skin:    General: Skin is warm and dry.  Neurological:     Mental Status: She is alert and oriented to person, place, and time.  Psychiatric:        Attention and Perception: Attention normal.        Mood and Affect: Mood normal. Mood is not anxious. Affect is not inappropriate.        Speech: Speech normal.        Behavior: Behavior normal. Behavior is cooperative.           Assessment & Plan:    Encounter Diagnosis  Name Primary?   Anxiety and depression Yes     -pt to continue prozac 30mg  daily -she is scheduled to follow up in 3 months.  She will contact office sooner prn.  She will cancel appointment if she gets insurance (and get new PCP)

## 2022-12-11 ENCOUNTER — Ambulatory Visit: Payer: Medicaid Other | Admitting: Physician Assistant

## 2023-08-29 ENCOUNTER — Encounter: Payer: Self-pay | Admitting: Emergency Medicine

## 2023-08-29 ENCOUNTER — Ambulatory Visit
Admission: EM | Admit: 2023-08-29 | Discharge: 2023-08-29 | Disposition: A | Discharge status: Home / Self Care | Payer: BC Managed Care – PPO | Attending: Family Medicine | Admitting: Family Medicine

## 2023-08-29 DIAGNOSIS — Z113 Encounter for screening for infections with a predominantly sexual mode of transmission: Secondary | ICD-10-CM | POA: Insufficient documentation

## 2023-08-29 DIAGNOSIS — R10819 Abdominal tenderness, unspecified site: Secondary | ICD-10-CM | POA: Diagnosis not present

## 2023-08-29 DIAGNOSIS — R1030 Lower abdominal pain, unspecified: Secondary | ICD-10-CM | POA: Insufficient documentation

## 2023-08-29 DIAGNOSIS — R350 Frequency of micturition: Secondary | ICD-10-CM | POA: Diagnosis present

## 2023-08-29 LAB — POCT URINALYSIS DIP (MANUAL ENTRY)
Bilirubin, UA: NEGATIVE
Blood, UA: NEGATIVE
Glucose, UA: NEGATIVE mg/dL
Ketones, POC UA: NEGATIVE mg/dL
Leukocytes, UA: NEGATIVE
Nitrite, UA: NEGATIVE
Protein Ur, POC: NEGATIVE mg/dL
Spec Grav, UA: 1.025 (ref 1.010–1.025)
Urobilinogen, UA: 0.2 U/dL
pH, UA: 7 (ref 5.0–8.0)

## 2023-08-29 NOTE — ED Triage Notes (Signed)
Lower stomach pain, lower back and urinary frequency x 2 weeks.

## 2023-08-29 NOTE — ED Provider Notes (Signed)
[facility]    CSN: [id number] Arrival date & time: 08/29/23  1027      History   Chief Complaint No chief complaint on file.   HPI Tracie Aguilar is a 37 y.o. female.   Patient presenting today with diffuse lower abdominal pain, urinary frequency for the past 2 weeks.  Denies fever, chills, nausea, vomiting, hematuria, bowel changes, vaginal discharge or irritation.  So far not tried anything over-the-counter for symptoms.  LMP 08/15/2023.  No known exposures to STIs but is agreeable to testing.    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/2021   Episodic cluster headache, not intractable 06/08/2020   Nephrolithiasis 03/11/2020   Hydronephrosis with urinary obstruction due to ureteral calculus 03/10/2020   PTSD (post-traumatic stress disorder) 06/03/2017   Major depressive disorder, recurrent severe without psychotic features (HCC) 06/03/2017   Depression 07/21/2015   Encounter for smoking cessation counseling 07/21/2015   Health care maintenance 07/21/2015    Past Surgical History:  Procedure Laterality Date   CYSTOSCOPY WITH RETROGRADE PYELOGRAM, URETEROSCOPY AND STENT PLACEMENT Left 03/12/2020   Procedure: CYSTOSCOPY WITH LEFT RETROGRADE PYELOGRAM, LEFT URETEROSCOPY AND LEFT URETERAL STENT PLACEMENT;  Surgeon: Malen Gauze, MD;  Location: [facility];  Service: Urology;  Laterality: Left;   EXCISION VAGINAL CYST Left 12/24/2019   Procedure: REMOVAL OF LEFT PERICLITORAL LESION;  Surgeon: Lazaro Arms, MD;  Location: [facility];  Service: Gynecology;  Laterality: Left;   HOLMIUM LASER APPLICATION Left 03/12/2020    Procedure: HOLMIUM LASER LITHOTRIPSY;  Surgeon: Malen Gauze, MD;  Location: [facility];  Service: Urology;  Laterality: Left;   TUBAL LIGATION  2013    OB History     Gravida  5   Para  3   Term      Preterm      AB      Living         SAB      IAB      Ectopic      Multiple      Live Births               Home Medications    Prior to Admission medications   Not on File    Family History Family History  Problem Relation Age of Onset   COPD Mother    Alcohol abuse Mother    Arthritis Mother        OA   Hypertension Mother    Alcohol abuse Father    Hypertension Father    Diabetes Father    Lung cancer Maternal Grandmother    Stroke Maternal Grandfather    Hypertension Maternal Grandfather    Diabetes Maternal Grandfather    Stroke Paternal Grandfather    Breast cancer Cousin    Diabetes Cousin    Migraines Neg Hx    Headache Neg Hx     Social History Social History   Tobacco Use   Smoking status: Former    Current packs/day: 0.00    Types: Cigarettes  Quit date: 10/23/2018    Years since quitting: 4.8   Smokeless tobacco: Never  Vaping Use   Vaping status: Some Days  Substance Use Topics   Alcohol use: Yes    Comment: once every couple of months, amount: "a couple of beers"   Drug use: Yes    Types: Marijuana, Cocaine    Comment: pt uses MJ maybe once/week.  no cocaine since her early 70s     Allergies   Sulfa antibiotics   Review of Systems Review of Systems Per HPI  Physical Exam Triage Vital Signs ED Triage Vitals  Encounter Vitals Group     BP 08/29/23 1042 117/82     Systolic BP Percentile --      Diastolic BP Percentile --      Pulse Rate 08/29/23 1042 96     Resp 08/29/23 1042 18     Temp 08/29/23 1042 98.1 F (36.7 C)     Temp Source 08/29/23 1042 Oral     SpO2 08/29/23 1042 99 %     Weight --      Height --      Head Circumference --      Peak Flow --      Pain Score 08/29/23 1043 5     Pain  Loc --      Pain Education --      Exclude from Growth Chart --    No data found.  Updated Vital Signs BP 117/82 (BP Location: Right Arm)   Pulse 96   Temp 98.1 F (36.7 C) (Oral)   Resp 18   LMP 08/15/2023 (Approximate)   SpO2 99%   Visual Acuity Right Eye Distance:   Left Eye Distance:   Bilateral Distance:    Right Eye Near:   Left Eye Near:    Bilateral Near:     Physical Exam Vitals and nursing note reviewed.  Constitutional:      Appearance: Normal appearance. She is not ill-appearing.  HENT:     Head: Atraumatic.     Mouth/Throat:     Mouth: Mucous membranes are moist.  Eyes:     Extraocular Movements: Extraocular movements intact.     Conjunctiva/sclera: Conjunctivae normal.  Cardiovascular:     Rate and Rhythm: Normal rate and regular rhythm.     Heart sounds: Normal heart sounds.  Pulmonary:     Effort: Pulmonary effort is normal.     Breath sounds: Normal breath sounds.  Abdominal:     General: Bowel sounds are normal. There is no distension.     Palpations: Abdomen is soft. There is no mass.     Tenderness: There is abdominal tenderness. There is no right CVA tenderness, left CVA tenderness, guarding or rebound.     Comments: Moderate diffuse lower abdominal tenderness to palpation without distention or guarding  Musculoskeletal:        General: Normal range of motion.     Cervical back: Normal range of motion and neck supple.  Skin:    General: Skin is warm and dry.  Neurological:     Mental Status: She is alert and oriented to person, place, and time.  Psychiatric:        Mood and Affect: Mood normal.        Thought Content: Thought content normal.        Judgment: Judgment normal.    UC Treatments / Results  Labs (all labs ordered are listed, but only abnormal results are displayed)  Labs Reviewed  POCT URINALYSIS DIP (MANUAL ENTRY)  CERVICOVAGINAL ANCILLARY ONLY    EKG   Radiology No results found.  Procedures Procedures  (including critical care time)  Medications Ordered in UC Medications - No data to display  Initial Impression / Assessment and Plan / UC Course  I have reviewed the triage vital signs and the nursing notes.  Pertinent labs & imaging results that were available during my care of the patient were reviewed by me and considered in my medical decision making (see chart for details).     Vitals and exam reassuring today, no red flag findings.  She does have some tenderness to the lower abdominal region diffusely but no guarding, distention.  Urinalysis completely benign today, vaginal swab pending for further evaluation.  Discussed follow-up with PCP or OB/GYN for further evaluation if not resolving.  Treat based on vaginal swab results.  Ibuprofen, Tylenol, heat as needed. Final Clinical Impressions(s) / UC Diagnoses   Final diagnoses:  Lower abdominal pain  Urinary frequency     Discharge Instructions      Your urinalysis was normal today, your vaginal swab to be back in the next few days and someone will call if anything comes back positive to discuss treatment.  Follow-up with your primary care provider or OB/GYN as soon as possible for further evaluation, go to the emergency department if symptoms worsen.  In the meantime, take ibuprofen, Tylenol, use heating pads   ED Prescriptions   None    PDMP not reviewed this encounter.   Particia Nearing, [place] 08/29/23 1306

## 2023-08-29 NOTE — Discharge Instructions (Addendum)
Your urinalysis was normal today, your vaginal swab to be back in the next few days and someone will call if anything comes back positive to discuss treatment.  Follow-up with your primary care provider or OB/GYN as soon as possible for further evaluation, go to the emergency department if symptoms worsen.  In the meantime, take ibuprofen, Tylenol, use heating pads

## 2023-08-30 LAB — CERVICOVAGINAL ANCILLARY ONLY
Bacterial Vaginitis (gardnerella): NEGATIVE
Candida Glabrata: NEGATIVE
Candida Vaginitis: NEGATIVE
Chlamydia: NEGATIVE
Comment: NEGATIVE
Comment: NEGATIVE
Comment: NEGATIVE
Comment: NEGATIVE
Comment: NEGATIVE
Comment: NORMAL
Neisseria Gonorrhea: NEGATIVE
Trichomonas: NEGATIVE

## 2023-10-18 DIAGNOSIS — H548 Legal blindness, as defined in USA: Secondary | ICD-10-CM | POA: Insufficient documentation

## 2023-10-18 DIAGNOSIS — Z720 Tobacco use: Secondary | ICD-10-CM | POA: Insufficient documentation

## 2023-10-22 ENCOUNTER — Ambulatory Visit (INDEPENDENT_AMBULATORY_CARE_PROVIDER_SITE_OTHER): Payer: BC Managed Care – PPO | Admitting: Orthopedic Surgery

## 2023-10-22 ENCOUNTER — Encounter: Payer: Self-pay | Admitting: Orthopedic Surgery

## 2023-10-22 ENCOUNTER — Other Ambulatory Visit (INDEPENDENT_AMBULATORY_CARE_PROVIDER_SITE_OTHER): Payer: Self-pay

## 2023-10-22 VITALS — BP 103/73 | HR 93 | Ht 63.0 in | Wt 206.0 lb

## 2023-10-22 DIAGNOSIS — M75102 Unspecified rotator cuff tear or rupture of left shoulder, not specified as traumatic: Secondary | ICD-10-CM

## 2023-10-22 DIAGNOSIS — M25512 Pain in left shoulder: Secondary | ICD-10-CM | POA: Diagnosis not present

## 2023-10-22 DIAGNOSIS — G8929 Other chronic pain: Secondary | ICD-10-CM | POA: Diagnosis not present

## 2023-10-22 DIAGNOSIS — S46912A Strain of unspecified muscle, fascia and tendon at shoulder and upper arm level, left arm, initial encounter: Secondary | ICD-10-CM | POA: Diagnosis not present

## 2023-10-22 MED ORDER — MELOXICAM 7.5 MG PO TABS: 7.5000 mg | ORAL_TABLET | Freq: Two times a day (BID) | ORAL | 0 refills | Status: AC

## 2023-10-22 MED ORDER — METHOCARBAMOL 500 MG PO TABS: 500.0000 mg | ORAL_TABLET | Freq: Every day | ORAL | 0 refills | Status: AC

## 2023-10-22 NOTE — Patient Instructions (Signed)
[place] [phone] to schedule

## 2023-10-22 NOTE — Progress Notes (Signed)
Chief Complaint  Patient presents with  . Shoulder Pain    Left shoulder pain, injured back in October.   BP 103/73   Pulse 93   Ht 5\' 3"  (1.6 m)   Wt 206 lb (93.4 kg)   BMI 36.49 kg/m   BC/BS AND VAYA     Office Visit Note   Patient: Tracie Aguilar           Date of Birth: 1986-08-16           MRN: [id number] Visit Date: 10/22/2023 Requested by: Dion Saucier, PA-C [address] [place],  [place] [address] PCP: [facility]   Assessment & Plan:   Encounter Diagnoses  Name Primary?  . Acute pain of left shoulder   . Rotator cuff syndrome of left shoulder   . Shoulder strain, left, initial encounter Yes    Meds ordered this encounter  Medications  . meloxicam (MOBIC) 7.5 MG tablet    Sig: Take 1 tablet (7.5 mg total) by mouth 2 (two) times daily.    Dispense:  60 tablet    Refill:  0  . methocarbamol (ROBAXIN) 500 MG tablet    Sig: Take 1 tablet (500 mg total) by mouth at bedtime.    Dispense:  30 tablet    Refill:  69    37 year old female strain of left shoulder deltoid or rotator cuff  Recommend NSAIDs Muscle relaxer Physical therapy 5-week follow-up   Subjective: Chief Complaint  Patient presents with  . Shoulder Pain    Left shoulder pain, injured back in October.    HPI: 37 year old female injured her left shoulder turning her mattress in October she took some ibuprofen did not get better presents with lateral shoulder pain over the deltoid night pain and some range of motion deficits which are minor.  Appears to be positional              ROS: No chest pain no shortness of breath and no evidence of numbness tingling or neck pain   Images personally read and my interpretation : No outside imaging  Visit Diagnoses:  1. Shoulder strain, left, initial encounter   2. Acute pain of left shoulder   3. Rotator cuff syndrome of left shoulder      Follow-Up Instructions: Return in about 5 weeks (around 11/26/2023) for FOLLOW UP, LEFT,  SHOULDER.    Objective: Vital Signs: BP 103/73   Pulse 93   Ht 5\' 3"  (1.6 m)   Wt 206 lb (93.4 kg)   BMI 36.49 kg/m   Physical Exam Vitals and nursing note reviewed.  Constitutional:      General: She is not in acute distress.    Appearance: Normal appearance. She is not ill-appearing, toxic-appearing or diaphoretic.  HENT:     Head: Normocephalic and atraumatic.     Nose: Nose normal. No congestion or rhinorrhea.  Eyes:     General: No scleral icterus.       Right eye: No discharge.        Left eye: No discharge.     Extraocular Movements: Extraocular movements intact.     Conjunctiva/sclera: Conjunctivae normal.     Pupils: Pupils are equal, round, and reactive to light.  Cardiovascular:     Pulses: Normal pulses.  Pulmonary:     Effort: Pulmonary effort is normal.     Breath sounds: No wheezing.  Skin:    General: Skin is warm and dry.  Capillary Refill: Capillary refill takes less than 2 seconds.     Coloration: Skin is not jaundiced.     Findings: No erythema.  Neurological:     General: No focal deficit present.     Mental Status: She is alert and oriented to person, place, and time.     Gait: Gait normal.  Psychiatric:        Mood and Affect: Mood normal.        Behavior: Behavior normal.        Thought Content: Thought content normal.        Judgment: Judgment normal.     Left Shoulder Exam   Tenderness  Left shoulder tenderness location: Lateral deltoid.  Range of Motion  Active abduction:  normal  Passive abduction:  normal  Extension:  normal  External rotation:  abnormal  Forward flexion:  normal   Muscle Strength  Abduction: 5/5  Supraspinatus: 5/5   Tests  Apprehension: negative Cross arm: negative Impingement: positive Drop arm: negative Sulcus: absent  Other  Erythema: absent Scars: absent Sensation: normal Pulse: present   Comments:  Pain with the speeds test      Specialty Comments:  No specialty comments  available.  Imaging: DG Shoulder Left Result Date: 10/22/2023 X-ray report Chief complaint pain lt shldr after injury Images 3 Reading: normal findings Impression: normal shoulder     PMFS History: Patient Active Problem List   Diagnosis Date Noted  . Legally blind in left eye, as defined in [place] 10/18/2023  . Tobacco use 10/18/2023  . MDD (major depressive disorder), recurrent severe, without psychosis (HCC) 04/11/2021  . Episodic cluster headache, not intractable 06/08/2020  . Nephrolithiasis 03/11/2020  . Hydronephrosis with urinary obstruction due to ureteral calculus 03/10/2020  . PTSD (post-traumatic stress disorder) 06/03/2017  . 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 Depression 07/21/2015  . Encounter for smoking cessation counseling 07/21/2015  . Health care maintenance 07/21/2015   Past Medical History:  Diagnosis Date  . Anxiety   . Asthma    childhood asthma  . Depression   . DVT (deep venous thrombosis) (HCC)   . Dysrhythmia   . Kidney stones 2007   passed x2  . Post traumatic stress disorder    prior medications   . Tachycardia     Family History  Problem Relation Age of Onset  . COPD Mother   . Alcohol abuse Mother   . Arthritis Mother        OA  . Hypertension Mother   . Alcohol abuse Father   . Hypertension Father   . Diabetes Father   . Lung cancer Maternal Grandmother   . Stroke Maternal Grandfather   . Hypertension Maternal Grandfather   . Diabetes Maternal Grandfather   . Stroke Paternal Grandfather   . Breast cancer Cousin   . Diabetes Cousin   . Migraines Neg Hx   . Headache Neg Hx     Past Surgical History:  Procedure Laterality Date  . CYSTOSCOPY WITH RETROGRADE PYELOGRAM, URETEROSCOPY AND STENT PLACEMENT Left 03/12/2020   Procedure: CYSTOSCOPY WITH LEFT RETROGRADE PYELOGRAM, LEFT URETEROSCOPY AND LEFT URETERAL STENT PLACEMENT;  Surgeon: Malen Gauze, MD;  Location: [facility];   Service: Urology;  Laterality: Left;  . EXCISION VAGINAL CYST Left 12/24/2019   Procedure: REMOVAL OF LEFT PERICLITORAL LESION;  Surgeon: Lazaro Arms, MD;  Location: [facility];  Service: Gynecology;  Laterality: Left;  . HOLMIUM LASER APPLICATION Left 03/12/2020  Procedure: HOLMIUM LASER LITHOTRIPSY;  Surgeon: Malen Gauze, MD;  Location: [facility];  Service: Urology;  Laterality: Left;  . TUBAL LIGATION  2013   Social History   Occupational History  . Not on file  Tobacco Use  . Smoking status: Former    Current packs/day: 0.00    Types: Cigarettes    Quit date: 10/23/2018    Years since quitting: 5.0  . Smokeless tobacco: Never  Vaping Use  . Vaping status: Some Days  Substance and Sexual Activity  . Alcohol use: Yes    Comment: once every couple of months, amount: "a couple of beers"  . Drug use: Yes    Types: Marijuana, Cocaine    Comment: pt uses MJ maybe once/week.  no cocaine since her early 74s  . Sexual activity: Not Currently    Birth control/protection: Surgical

## 2023-10-23 NOTE — Addendum Note (Signed)
Addended by: Debby Bud R on: 10/23/2023 02:21 PM   Modules accepted: Orders

## 2023-11-05 ENCOUNTER — Ambulatory Visit (HOSPITAL_COMMUNITY): Payer: BC Managed Care – PPO

## 2023-11-23 NOTE — Progress Notes (Deleted)
   There were no vitals taken for this visit.  There is no height or weight on file to calculate BMI.  No chief complaint on file.   No diagnosis found.  DOI/DOS/ {Date:23773::"***"}  {CHL AMB ORT SYMPTOMS POST TREATMENT:21798}

## 2023-11-26 ENCOUNTER — Ambulatory Visit: Payer: BC Managed Care – PPO | Admitting: Orthopedic Surgery

## 2024-10-22 ENCOUNTER — Emergency Department (HOSPITAL_BASED_OUTPATIENT_CLINIC_OR_DEPARTMENT_OTHER)
Admission: EM | Admit: 2024-10-22 | Discharge: 2024-10-22 | Disposition: A | Discharge status: Home / Self Care | Attending: Emergency Medicine | Admitting: Emergency Medicine

## 2024-10-22 ENCOUNTER — Other Ambulatory Visit: Payer: Self-pay

## 2024-10-22 ENCOUNTER — Encounter (HOSPITAL_BASED_OUTPATIENT_CLINIC_OR_DEPARTMENT_OTHER): Payer: Self-pay

## 2024-10-22 DIAGNOSIS — R519 Headache, unspecified: Secondary | ICD-10-CM | POA: Insufficient documentation

## 2024-10-22 DIAGNOSIS — R112 Nausea with vomiting, unspecified: Secondary | ICD-10-CM | POA: Insufficient documentation

## 2024-10-22 DIAGNOSIS — H538 Other visual disturbances: Secondary | ICD-10-CM | POA: Insufficient documentation

## 2024-10-22 LAB — COMPREHENSIVE METABOLIC PANEL WITH GFR
ALT: 9 U/L (ref 0–44)
AST: 14 U/L — ABNORMAL LOW (ref 15–41)
Albumin: 4.2 g/dL (ref 3.5–5.0)
Alkaline Phosphatase: 103 U/L (ref 38–126)
Anion gap: 14 (ref 5–15)
BUN: 11 mg/dL (ref 6–20)
CO2: 24 mmol/L (ref 22–32)
Calcium: 9.5 mg/dL (ref 8.9–10.3)
Chloride: 99 mmol/L (ref 98–111)
Creatinine, Ser: 0.57 mg/dL (ref 0.44–1.00)
GFR, Estimated: >60 mL/min
Glucose, Bld: 97 mg/dL (ref 70–99)
Potassium: 4.1 mmol/L (ref 3.5–5.1)
Sodium: 138 mmol/L (ref 135–145)
Total Bilirubin: 0.3 mg/dL (ref 0.0–1.2)
Total Protein: 7.5 g/dL (ref 6.5–8.1)

## 2024-10-22 LAB — CBC
HCT: 39.7 % (ref 36.0–46.0)
Hemoglobin: 13.3 g/dL (ref 12.0–15.0)
MCH: 27.5 pg (ref 26.0–34.0)
MCHC: 33.5 g/dL (ref 30.0–36.0)
MCV: 82 fL (ref 80.0–100.0)
Platelets: 424 K/uL — ABNORMAL HIGH (ref 150–400)
RBC: 4.84 MIL/uL (ref 3.87–5.11)
RDW: 13.6 % (ref 11.5–15.5)
WBC: 14.6 K/uL — ABNORMAL HIGH (ref 4.0–10.5)
nRBC: 0 % (ref 0.0–0.2)

## 2024-10-22 LAB — URINALYSIS, ROUTINE W REFLEX MICROSCOPIC
Bilirubin Urine: NEGATIVE
Glucose, UA: NEGATIVE mg/dL
Hgb urine dipstick: NEGATIVE
Ketones, ur: NEGATIVE mg/dL
Leukocytes,Ua: NEGATIVE
Nitrite: NEGATIVE
Protein, ur: NEGATIVE mg/dL
Specific Gravity, Urine: >=1.03 (ref 1.005–1.030)
pH: 5.5 (ref 5.0–8.0)

## 2024-10-22 LAB — PREGNANCY, URINE: Preg Test, Ur: NEGATIVE

## 2024-10-22 NOTE — ED Notes (Signed)
 Pt no answer x 3 - assumed LWBS

## 2024-10-22 NOTE — ED Triage Notes (Signed)
 Intermittent headaches, NV, sweating for 1 week. States feels like she has to force her eyes to look to the right denies blurry vision.

## 2024-10-23 ENCOUNTER — Encounter: Payer: Self-pay | Admitting: Emergency Medicine

## 2024-10-23 ENCOUNTER — Ambulatory Visit
Admission: EM | Admit: 2024-10-23 | Discharge: 2024-10-23 | Disposition: A | Discharge status: Home / Self Care | Source: Home / Self Care

## 2024-10-23 ENCOUNTER — Other Ambulatory Visit: Payer: Self-pay

## 2024-10-23 DIAGNOSIS — J0100 Acute maxillary sinusitis, unspecified: Secondary | ICD-10-CM

## 2024-10-23 DIAGNOSIS — R519 Headache, unspecified: Secondary | ICD-10-CM | POA: Diagnosis not present

## 2024-10-23 DIAGNOSIS — J3089 Other allergic rhinitis: Secondary | ICD-10-CM | POA: Diagnosis not present

## 2024-10-23 MED ORDER — AMOXICILLIN-POT CLAVULANATE 875-125 MG PO TABS: 1.0000 | ORAL_TABLET | Freq: Two times a day (BID) | ORAL | 0 refills | Status: AC

## 2024-10-23 MED ORDER — DEXAMETHASONE SOD PHOSPHATE PF 10 MG/ML IJ SOLN
10.0000 mg | Freq: Once | INTRAMUSCULAR | Status: AC
  Administered 2024-10-23: 10 mg via INTRAMUSCULAR

## 2024-10-23 MED ORDER — CETIRIZINE HCL 10 MG PO TABS: 10.0000 mg | ORAL_TABLET | Freq: Every day | ORAL | 2 refills | Status: AC

## 2024-10-23 MED ORDER — AZELASTINE HCL 0.1 % NA SOLN: 1.0000 | Freq: Two times a day (BID) | NASAL | 0 refills | Status: AC

## 2024-10-23 NOTE — ED Triage Notes (Addendum)
 Pt reports intermittent headaches for last several weeks. Pt reports doesn't currently have pcp and reports is in between providers. Reports hx of similar with sinus infection. Was seen in ED yesterday and had bloodwork completed but left prior to someone reviewing results. Pt is concerned about abnormal results that they received in mychart this am. Denies headache/symptoms at this time.

## 2024-10-23 NOTE — Discharge Instructions (Signed)
 In addition to the prescribed medications, we have given you a steroid shot today to help with pain and inflammation.  You may additionally use warm saline sinus rinses, humidifiers, over-the-counter pain relievers.  Return for worsening or unresolving symptoms.

## 2024-10-27 NOTE — ED Provider Notes (Signed)
 " [facility]    CSN: [id number] Arrival date & time: 10/23/24  0940      History   Chief Complaint Chief Complaint  Patient presents with   Headache    HPI Tracie Aguilar is a 39 y.o. female.   Patient presenting today with ongoing intermittent sinus headaches for the last several weeks, nasal congestion, facial pain and pressure.  She states this same situation happened several years ago and it was a sinus infection that was treated resolve the headache.  Has been trying over-the-counter pain relievers with minimal relief of the headache.  Denies fever, chills, dizziness, head injury, weakness numbness or tingling of extremities, nausea, vomiting, visual change.  History of seasonal allergies and asthma not currently on anything for these.    Past Medical History:  Diagnosis Date   Anxiety    Asthma    childhood asthma   Depression    DVT (deep venous thrombosis) (HCC)    Dysrhythmia    Kidney stones 2007   passed x2   Post traumatic stress disorder    prior medications    Tachycardia     Patient Active Problem List   Diagnosis Date Noted   Legally blind in left eye, as defined in [place]  10/18/2023   Tobacco use 10/18/2023   MDD (major depressive disorder), recurrent severe, without psychosis (HCC) 04/11/2021   Episodic cluster headache, not intractable 06/08/2020   Nephrolithiasis 03/11/2020   Hydronephrosis with urinary obstruction due to ureteral calculus 03/10/2020   PTSD (post-traumatic stress disorder) 06/03/2017   Major depressive disorder, recurrent severe without psychotic features (HCC) 06/03/2017   Depression 07/21/2015   Encounter for smoking cessation counseling 07/21/2015   Health care maintenance 07/21/2015    Past Surgical History:  Procedure Laterality Date   CYSTOSCOPY WITH RETROGRADE PYELOGRAM, URETEROSCOPY AND STENT PLACEMENT Left 03/12/2020   Procedure: CYSTOSCOPY WITH LEFT RETROGRADE PYELOGRAM, LEFT URETEROSCOPY AND LEFT  URETERAL STENT PLACEMENT;  Surgeon: Sherrilee Belvie CROME, MD;  Location: [facility];  Service: Urology;  Laterality: Left;   EXCISION VAGINAL CYST Left 12/24/2019   Procedure: REMOVAL OF LEFT PERICLITORAL LESION;  Surgeon: Jayne Vonn DEL, MD;  Location: [facility];  Service: Gynecology;  Laterality: Left;   HOLMIUM LASER APPLICATION Left 03/12/2020   Procedure: HOLMIUM LASER LITHOTRIPSY;  Surgeon: Sherrilee Belvie CROME, MD;  Location: [facility];  Service: Urology;  Laterality: Left;   TUBAL LIGATION  2013    OB History     Gravida  5   Para  3   Term      Preterm      AB      Living         SAB      IAB      Ectopic      Multiple      Live Births               Home Medications    Prior to Admission medications  Medication Sig Start Date End Date Taking? Authorizing Provider  amoxicillin -clavulanate (AUGMENTIN ) 875-125 MG tablet Take 1 tablet by mouth every 12 (twelve) hours. 10/23/24  Yes Stuart Vernell Norris, PA-C  azelastine  (ASTELIN ) 0.1 % nasal spray Place 1 spray into both nostrils 2 (two) times daily. Use in each nostril as directed 10/23/24  Yes Stuart Vernell Norris, PA-C  cetirizine  (ZYRTEC  ALLERGY) 10 MG tablet Take 1 tablet (10 mg total) by mouth daily. 10/23/24  Yes Stuart Vernell Norris, PA-C  meloxicam  (MOBIC ) 7.5  MG tablet Take 1 tablet (7.5 mg total) by mouth 2 (two) times daily. 10/22/23   Margrette Taft BRAVO, MD  methocarbamol  (ROBAXIN ) 500 MG tablet Take 1 tablet (500 mg total) by mouth at bedtime. 10/22/23   Margrette Taft BRAVO, MD    Family History Family History  Problem Relation Age of Onset   COPD Mother    Alcohol abuse Mother    Arthritis Mother        OA   Hypertension Mother    Alcohol abuse Father    Hypertension Father    Diabetes Father    Lung cancer Maternal Grandmother    Stroke Maternal Grandfather    Hypertension Maternal Grandfather    Diabetes Maternal Grandfather    Stroke Paternal Grandfather    Breast cancer Cousin    Diabetes  Cousin    Migraines Neg Hx    Headache Neg Hx     Social History Social History[1]   Allergies   Sulfa antibiotics   Review of Systems Review of Systems PER HPI  Physical Exam Triage Vital Signs ED Triage Vitals  Encounter Vitals Group     BP 10/23/24 1049 126/87     Girls Systolic BP Percentile --      Girls Diastolic BP Percentile --      Boys Systolic BP Percentile --      Boys Diastolic BP Percentile --      Pulse Rate 10/23/24 1049 87     Resp 10/23/24 1049 18     Temp 10/23/24 1049 98.1 F (36.7 C)     Temp Source 10/23/24 1049 Oral     SpO2 10/23/24 1049 98 %     Weight --      Height --      Head Circumference --      Peak Flow --      Pain Score 10/23/24 1046 0     Pain Loc --      Pain Education --      Exclude from Growth Chart --    No data found.  Updated Vital Signs BP 126/87 (BP Location: Right Arm)   Pulse 87   Temp 98.1 F (36.7 C) (Oral)   Resp 18   LMP 09/07/2024 (Approximate)   SpO2 98%   Visual Acuity Right Eye Distance:   Left Eye Distance:   Bilateral Distance:    Right Eye Near:   Left Eye Near:    Bilateral Near:     Physical Exam Vitals and nursing note reviewed.  Constitutional:      Appearance: Normal appearance.  HENT:     Head: Atraumatic.     Right Ear: Tympanic membrane and external ear normal.     Left Ear: Tympanic membrane and external ear normal.     Nose: Congestion present.     Mouth/Throat:     Mouth: Mucous membranes are moist.     Pharynx: Posterior oropharyngeal erythema present.  Eyes:     Extraocular Movements: Extraocular movements intact.     Conjunctiva/sclera: Conjunctivae normal.  Cardiovascular:     Rate and Rhythm: Normal rate and regular rhythm.     Heart sounds: Normal heart sounds.  Pulmonary:     Effort: Pulmonary effort is normal.     Breath sounds: Normal breath sounds. No wheezing or rales.  Musculoskeletal:        General: Normal range of motion.     Cervical back: Normal  range of motion and neck supple.  Skin:    General: Skin is warm and dry.  Neurological:     General: No focal deficit present.     Mental Status: She is alert and oriented to person, place, and time.  Psychiatric:        Mood and Affect: Mood normal.        Thought Content: Thought content normal.      UC Treatments / Results  Labs (all labs ordered are listed, but only abnormal results are displayed) Labs Reviewed - No data to display  EKG   Radiology No results found.  Procedures Procedures (including critical care time)  Medications Ordered in UC Medications  dexamethasone  (DECADRON ) injection 10 mg (10 mg Intramuscular Given 10/23/24 1122)    Initial Impression / Assessment and Plan / UC Course  I have reviewed the triage vital signs and the nursing notes.  Pertinent labs & imaging results that were available during my care of the patient were reviewed by me and considered in my medical decision making (see chart for details).     Suspect sinusitis secondary to uncontrolled seasonal allergies.  This is likely triggering her ongoing sinus headache.  Will treat with IM Decadron , Augmentin , Astelin , Zyrtec , supportive over-the-counter medications and home care.  Return for worsening or unresolving symptoms.  Final Clinical Impressions(s) / UC Diagnoses   Final diagnoses:  Acute nonintractable headache, unspecified headache type  Acute non-recurrent maxillary sinusitis  Seasonal allergic rhinitis due to other allergic trigger     Discharge Instructions      In addition to the prescribed medications, we have given you a steroid shot today to help with pain and inflammation.  You may additionally use warm saline sinus rinses, humidifiers, over-the-counter pain relievers.  Return for worsening or unresolving symptoms.    ED Prescriptions     Medication Sig Dispense Auth. Provider   amoxicillin -clavulanate (AUGMENTIN ) 875-125 MG tablet Take 1 tablet by mouth  every 12 (twelve) hours. 14 tablet Stuart Vernell Norris, PA-C   azelastine  (ASTELIN ) 0.1 % nasal spray Place 1 spray into both nostrils 2 (two) times daily. Use in each nostril as directed 30 mL Stuart Vernell Norris, PA-C   cetirizine  (ZYRTEC  ALLERGY) 10 MG tablet Take 1 tablet (10 mg total) by mouth daily. 30 tablet Stuart Vernell Norris, [place]      PDMP not reviewed this encounter.     [1]  Social History Tobacco Use   Smoking status: Former    Current packs/day: 0.00    Types: Cigarettes    Quit date: 10/23/2018    Years since quitting: 6.0   Smokeless tobacco: Never  Vaping Use   Vaping status: Some Days  Substance Use Topics   Alcohol use: Yes    Comment: once every couple of months, amount: a couple of beers   Drug use: Yes    Types: Marijuana, Cocaine    Comment: pt uses MJ maybe once/week.  no cocaine since her early 29s     Stuart Vernell Norris, [place] 10/27/24 1800  "
